# Patient Record
Sex: Male | Born: 1945 | Race: Black or African American | Hispanic: No | Marital: Married | Smoking: Former smoker
Health system: Southern US, Community
[De-identification: ages and names within clinical notes are randomized; demographics above are authoritative.]

## PROBLEM LIST (undated history)

## (undated) DIAGNOSIS — R972 Elevated prostate specific antigen [PSA]: Secondary | ICD-10-CM

## (undated) DIAGNOSIS — R002 Palpitations: Secondary | ICD-10-CM

## (undated) DIAGNOSIS — M2669 Other specified disorders of temporomandibular joint: Secondary | ICD-10-CM

## (undated) DIAGNOSIS — I10 Essential (primary) hypertension: Secondary | ICD-10-CM

## (undated) DIAGNOSIS — I480 Paroxysmal atrial fibrillation: Secondary | ICD-10-CM

## (undated) DIAGNOSIS — N529 Male erectile dysfunction, unspecified: Secondary | ICD-10-CM

## (undated) DIAGNOSIS — N182 Chronic kidney disease, stage 2 (mild): Secondary | ICD-10-CM

## (undated) DIAGNOSIS — Z9841 Cataract extraction status, right eye: Secondary | ICD-10-CM

## (undated) DIAGNOSIS — E039 Hypothyroidism, unspecified: Secondary | ICD-10-CM

## (undated) DIAGNOSIS — I5189 Other ill-defined heart diseases: Secondary | ICD-10-CM

## (undated) DIAGNOSIS — K635 Polyp of colon: Secondary | ICD-10-CM

## (undated) DIAGNOSIS — I77810 Thoracic aortic ectasia: Secondary | ICD-10-CM

## (undated) DIAGNOSIS — M75102 Unspecified rotator cuff tear or rupture of left shoulder, not specified as traumatic: Secondary | ICD-10-CM

## (undated) DIAGNOSIS — I499 Cardiac arrhythmia, unspecified: Secondary | ICD-10-CM

## (undated) DIAGNOSIS — Z87891 Personal history of nicotine dependence: Secondary | ICD-10-CM

## (undated) DIAGNOSIS — J45909 Unspecified asthma, uncomplicated: Secondary | ICD-10-CM

## (undated) DIAGNOSIS — D696 Thrombocytopenia, unspecified: Secondary | ICD-10-CM

## (undated) DIAGNOSIS — N400 Enlarged prostate without lower urinary tract symptoms: Secondary | ICD-10-CM

## (undated) DIAGNOSIS — E785 Hyperlipidemia, unspecified: Secondary | ICD-10-CM

## (undated) DIAGNOSIS — I4729 Other ventricular tachycardia: Secondary | ICD-10-CM

## (undated) DIAGNOSIS — I700 Atherosclerosis of aorta: Secondary | ICD-10-CM

## (undated) DIAGNOSIS — Z9289 Personal history of other medical treatment: Secondary | ICD-10-CM

## (undated) DIAGNOSIS — I471 Supraventricular tachycardia, unspecified: Secondary | ICD-10-CM

## (undated) DIAGNOSIS — E038 Other specified hypothyroidism: Secondary | ICD-10-CM

## (undated) DIAGNOSIS — R0789 Other chest pain: Secondary | ICD-10-CM

## (undated) DIAGNOSIS — Z7901 Long term (current) use of anticoagulants: Secondary | ICD-10-CM

## (undated) HISTORY — DX: Supraventricular tachycardia, unspecified: I47.10

## (undated) HISTORY — PX: HERNIA REPAIR: SHX51

## (undated) HISTORY — PX: CATARACT EXTRACTION: SUR2

## (undated) HISTORY — DX: Paroxysmal atrial fibrillation: I48.0

## (undated) HISTORY — PX: TRIGGER FINGER RELEASE: SHX641

## (undated) HISTORY — PX: EYE SURGERY: SHX253

## (undated) HISTORY — DX: Hyperlipidemia, unspecified: E78.5

## (undated) HISTORY — DX: Other ventricular tachycardia: I47.29

## (undated) HISTORY — DX: Chronic kidney disease, stage 2 (mild): N18.2

## (undated) HISTORY — DX: Palpitations: R00.2

## (undated) HISTORY — DX: Unspecified asthma, uncomplicated: J45.909

## (undated) HISTORY — DX: Other ill-defined heart diseases: I51.89

## (undated) HISTORY — DX: Personal history of other medical treatment: Z92.89

## (undated) HISTORY — PX: KNEE SURGERY: SHX244

## (undated) HISTORY — PX: FINGER SURGERY: SHX640

## (undated) HISTORY — DX: Thoracic aortic ectasia: I77.810

## (undated) HISTORY — DX: Essential (primary) hypertension: I10

## (undated) HISTORY — PX: INGUINAL HERNIA REPAIR: SUR1180

---

## 2004-11-12 ENCOUNTER — Ambulatory Visit: Payer: Self-pay | Admitting: General Practice

## 2005-12-21 HISTORY — PX: INGUINAL HERNIA REPAIR: SUR1180

## 2006-05-19 ENCOUNTER — Ambulatory Visit: Payer: Self-pay

## 2006-10-15 ENCOUNTER — Ambulatory Visit: Payer: Self-pay

## 2006-11-16 ENCOUNTER — Ambulatory Visit: Payer: Self-pay | Admitting: Gastroenterology

## 2006-12-21 HISTORY — PX: INGUINAL HERNIA REPAIR: SUR1180

## 2007-11-07 ENCOUNTER — Ambulatory Visit: Payer: Self-pay | Admitting: Family Medicine

## 2008-10-31 ENCOUNTER — Ambulatory Visit: Payer: Self-pay | Admitting: Family Medicine

## 2009-12-11 ENCOUNTER — Ambulatory Visit: Payer: Self-pay | Admitting: Gastroenterology

## 2010-03-25 ENCOUNTER — Ambulatory Visit: Payer: Self-pay | Admitting: Family Medicine

## 2011-05-14 ENCOUNTER — Ambulatory Visit: Payer: Self-pay | Admitting: Family Medicine

## 2012-10-06 ENCOUNTER — Ambulatory Visit: Payer: Self-pay | Admitting: Unknown Physician Specialty

## 2013-09-01 ENCOUNTER — Ambulatory Visit: Payer: Self-pay | Admitting: Family Medicine

## 2013-10-26 ENCOUNTER — Emergency Department: Payer: Self-pay | Admitting: Emergency Medicine

## 2013-10-26 ENCOUNTER — Ambulatory Visit (INDEPENDENT_AMBULATORY_CARE_PROVIDER_SITE_OTHER): Payer: 59 | Admitting: Cardiovascular Disease

## 2013-10-26 ENCOUNTER — Encounter: Payer: Self-pay | Admitting: Cardiovascular Disease

## 2013-10-26 VITALS — BP 100/68 | HR 139 | Ht 71.0 in | Wt 185.2 lb

## 2013-10-26 DIAGNOSIS — I4891 Unspecified atrial fibrillation: Secondary | ICD-10-CM | POA: Diagnosis not present

## 2013-10-26 DIAGNOSIS — I480 Paroxysmal atrial fibrillation: Secondary | ICD-10-CM | POA: Insufficient documentation

## 2013-10-26 LAB — APTT: Activated PTT: 35.6 secs (ref 23.6–35.9)

## 2013-10-26 LAB — COMPREHENSIVE METABOLIC PANEL
Albumin: 3.2 g/dL — ABNORMAL LOW (ref 3.4–5.0)
Anion Gap: 1 — ABNORMAL LOW (ref 7–16)
BUN: 15 mg/dL (ref 7–18)
Bilirubin,Total: 0.4 mg/dL (ref 0.2–1.0)
Co2: 28 mmol/L (ref 21–32)
EGFR (African American): >60
Glucose: 100 mg/dL — ABNORMAL HIGH (ref 65–99)
Osmolality: 280 (ref 275–301)
Potassium: 4.1 mmol/L (ref 3.5–5.1)
SGOT(AST): 32 U/L (ref 15–37)
SGPT (ALT): 23 U/L (ref 12–78)

## 2013-10-26 LAB — CBC WITH DIFFERENTIAL/PLATELET
Basophil #: 0.1 10*3/uL (ref 0.0–0.1)
Eosinophil #: 0.2 10*3/uL (ref 0.0–0.7)
Eosinophil %: 4.2 %
HCT: 44.2 % (ref 40.0–52.0)
MCH: 31 pg (ref 26.0–34.0)
MCHC: 35.6 g/dL (ref 32.0–36.0)
RBC: 5.07 10*6/uL (ref 4.40–5.90)
WBC: 5.8 10*3/uL (ref 3.8–10.6)

## 2013-10-26 LAB — PROTIME-INR: Prothrombin Time: 14 secs (ref 11.5–14.7)

## 2013-10-26 LAB — CK TOTAL AND CKMB (NOT AT ARMC)
CK, Total: 295 U/L — ABNORMAL HIGH (ref 35–232)
CK-MB: 1.5 ng/mL (ref 0.5–3.6)

## 2013-10-26 LAB — URINALYSIS, COMPLETE
Blood: NEGATIVE
Ketone: NEGATIVE
Ph: 5 (ref 4.5–8.0)
Squamous Epithelial: NONE SEEN

## 2013-10-26 LAB — TROPONIN I: Troponin-I: <0.02 ng/mL

## 2013-10-26 LAB — MAGNESIUM: Magnesium: 1.8 mg/dL

## 2013-10-26 MED ORDER — AMIODARONE HCL 200 MG PO TABS: 200.0000 mg | ORAL_TABLET | Freq: Two times a day (BID) | ORAL | Status: DC

## 2013-10-26 MED ORDER — APIXABAN 5 MG PO TABS: 5.0000 mg | ORAL_TABLET | Freq: Two times a day (BID) | ORAL | Status: DC

## 2013-10-26 MED ORDER — METOPROLOL TARTRATE 25 MG PO TABS: 25.0000 mg | ORAL_TABLET | Freq: Two times a day (BID) | ORAL | Status: DC

## 2013-10-26 NOTE — Assessment & Plan Note (Addendum)
Hospital records were reviewed including lab work, emergency room records and EKGs . Notes from Dr. Juanetta Gosling and his EKG reviewed .Rate improved on clinical exam. We will start him on Eliquis  5 mg twice a day, will start metoprolol 25 mg twice a day and amiodarone 400 mg twice a day for 5 days with titration down to 200 mg twice a day after that. We'll schedule him for followup in 2 weeks' time. We have suggested that he closely monitor his heart rate. If he feels he has converted back to normal sinus rhythm, we have suggested he call our office. We will hold off on echocardiogram until rate is slower or he has converted back to normal sinus rhythm. Cardioversion may be needed if he does not convert to normal sinus rhythm in the next 2 weeks.

## 2013-10-26 NOTE — Patient Instructions (Signed)
You are in atrial fibrillation Please start metoprolol 25 mg twice a day  Please start amiodarone 400 mg twice a day for 5 days, then 200 mg twice a day  Start eliquis 5 mg twice a day (blood thinner)  Come in for follow up visit in 10 to 14 days, ekg at that time  Call the office if you think you are back in normal rhythm  Please call us if you have new issues that need to be addressed before your next appt.

## 2013-10-26 NOTE — Progress Notes (Signed)
   Patient ID: Andrew Benson, male    DOB: 10-02-1946, 67 y.o.   MRN: [id number]  HPI Comments: Mr. Brobeck is a pleasant 67 year old gentleman with remote history of atrial fibrillation 25 years ago, remote history of smoking for 20 years, patient of Dr. Juanetta Gosling who presents from the emergency room for evaluation of atrial fibrillation.  He reports that he was on his tractor yesterday when he developed the acute onset of palpitations. He went into the house, and did no further workup for the rest of the day. He went to sleep and woke up this morning again with palpitations. He went to see Dr. Juanetta Gosling and was referred to the emergency room.  In the emergency room, he was noted to be in atrial fibrillation with ventricular rate 93 beats per minute EKG earlier in the day showed atrial fibrillation with ventricular rate 99 beats per minute  Lab work in the hospital showed hematocrit 44, creatinine 1.3, magnesium 1.8, potassium 4.1, normal cardiac enzymes, TSH 0.42.  EKG in office today shows atrial fibrillation with ventricular rate 139 beats per minute, nonspecific ST abnormality    Outpatient Encounter Prescriptions as of 10/26/2013  Medication Sig  . aspirin 81 MG tablet Take 81 mg by mouth daily.  Marland Kitchen b complex vitamins capsule Take 1 capsule by mouth daily.  . Omega-3 Fatty Acids (FISH OIL) 1000 MG CAPS Take 1,000 mg by mouth 2 (two) times daily.  . vitamin E 400 UNIT capsule Take 400 Units by mouth daily.     Review of Systems  Constitutional: Negative.   HENT: Negative.   Eyes: Negative.   Respiratory: Negative.   Cardiovascular: Positive for palpitations.  Gastrointestinal: Negative.   Endocrine: Negative.   Musculoskeletal: Negative.   Skin: Negative.   Allergic/Immunologic: Negative.   Neurological: Negative.   Hematological: Negative.   Psychiatric/Behavioral: Negative.   All other systems reviewed and are negative.   BP 100/68  Pulse 139  Ht 5\' 11"  (1.803 m)  Wt 185 lb 4  oz (84.029 kg)  BMI 25.85 kg/m2   Physical Exam  Nursing note and vitals reviewed. Constitutional: He is oriented to person, place, and time. He appears well-developed and well-nourished.  HENT:  Head: Normocephalic.  Nose: Nose normal.  Mouth/Throat: Oropharynx is clear and moist.  Eyes: Conjunctivae are normal. Pupils are equal, round, and reactive to light.  Neck: Normal range of motion. Neck supple. No JVD present.  Cardiovascular: Normal rate, regular rhythm, S1 normal, S2 normal, normal heart sounds and intact distal pulses.  Exam reveals no gallop and no friction rub.   No murmur heard. Pulmonary/Chest: Effort normal and breath sounds normal. No respiratory distress. He has no wheezes. He has no rales. He exhibits no tenderness.  Abdominal: Soft. Bowel sounds are normal. He exhibits no distension. There is no tenderness.  Musculoskeletal: Normal range of motion. He exhibits no edema and no tenderness.  Lymphadenopathy:    He has no cervical adenopathy.  Neurological: He is alert and oriented to person, place, and time. Coordination normal.  Skin: Skin is warm and dry. No rash noted. No erythema.  Psychiatric: He has a normal mood and affect. His behavior is normal. Judgment and thought content normal.      Assessment and Plan

## 2013-10-30 ENCOUNTER — Telehealth: Payer: Self-pay | Admitting: *Deleted

## 2013-10-30 NOTE — Telephone Encounter (Signed)
Spoke w/ pt.  He states that he is no longer in afib and would like to stop his medications.  Pt has not been evaluated or had an ekg to confirm this.  Pt sched to see Dr. Mariah Milling tomorrow for follow up.

## 2013-10-30 NOTE — Telephone Encounter (Signed)
He called and is feeling better and out of afib. Please call he has some medication questions.

## 2013-10-31 ENCOUNTER — Encounter: Payer: Self-pay | Admitting: Cardiovascular Disease

## 2013-10-31 ENCOUNTER — Ambulatory Visit (INDEPENDENT_AMBULATORY_CARE_PROVIDER_SITE_OTHER): Payer: 59 | Admitting: Cardiovascular Disease

## 2013-10-31 VITALS — BP 110/80 | HR 54 | Ht 71.0 in | Wt 186.5 lb

## 2013-10-31 DIAGNOSIS — I4891 Unspecified atrial fibrillation: Secondary | ICD-10-CM

## 2013-10-31 NOTE — Patient Instructions (Signed)
You are doing well.  Please start asa 81 mg daily OK to hold eliquis  If you convert to atrial fibrillation,  With high rate take eliquis twice a  (thinner) Take amiodarone 400 mg twice a day, with metoprolol twice a day Until you convert back to normal rhythm  Please call us if you have new issues that need to be addressed before your next appt.  Your physician wants you to follow-up in: 12 months.  You will receive a reminder letter in the mail two months in advance. If you don't receive a letter, please call our office to schedule the follow-up appointment.

## 2013-10-31 NOTE — Assessment & Plan Note (Signed)
He has converted back to normal sinus rhythm. We have suggested he hold his anticoagulation. He is bradycardic today but asymptomatic. He is no longer on amiodarone or metoprolol. He we'll closely monitor his heart rate to track for recurrent episodes of atrial fibrillation. He can monitor his heart rate with blood pressure cuff, pulse oximetry at home. If he converts back to atrial fibrillation, he will restart anticoagulation, amiodarone, metoprolol and call our office.

## 2013-10-31 NOTE — Progress Notes (Signed)
Patient ID: Andrew Benson, male    DOB: 1946-01-21, 67 y.o.   MRN: [id number]  HPI Comments: Andrew Benson is a pleasant 67 year old gentleman with remote history of atrial fibrillation 25 years ago, remote history of smoking for 20 years, patient of Dr. Juanetta Gosling who presents for routine followup for atrial fibrillation.   He reports that he was on his tractor last week when he developed the acute onset of palpitations. He went into the house, and did no further workup for the rest of the day. He went to sleep and woke up this morning again with palpitations. He went to see Dr. Juanetta Gosling and was referred to the emergency room. In the emergency room, he was noted to be in atrial fibrillation with ventricular rate 93 beats per minute EKG earlier in the day showed atrial fibrillation with ventricular rate 99 beats per minute  He was seen in our office last week and started on eliquis 5 mg twice a day, metoprolol 25 mg twice a day, amiodarone 400 mg load twice a day. After 2 days, he felt that he is back in normal sinus rhythm, heart rate in the 50s. He stop the amiodarone and metoprolol, continue the eliquis. Today he feels well with no complaints. Last dosing of amiodarone and metoprolol was November 8.  Lab work in the hospital showed hematocrit 44, creatinine 1.3, magnesium 1.8, potassium 4.1, normal cardiac enzymes, TSH 0.42.  EKG in office last week showed atrial fibrillation with ventricular rate 139 beats per minute, nonspecific ST abnormality EKG today shows normal sinus rhythm with rate 54 beats per minute, no significant ST or T wave changes    Outpatient Encounter Prescriptions as of 10/26/2013  Medication Sig  . aspirin 81 MG tablet Take 81 mg by mouth daily.  Marland Kitchen b complex vitamins capsule Take 1 capsule by mouth daily.  . Omega-3 Fatty Acids (FISH OIL) 1000 MG CAPS Take 1,000 mg by mouth 2 (two) times daily.  . vitamin E 400 UNIT capsule Take 400 Units by mouth daily.     Review of  Systems  Constitutional: Negative.   HENT: Negative.   Eyes: Negative.   Respiratory: Negative.   Cardiovascular: Positive for palpitations.  Gastrointestinal: Negative.   Endocrine: Negative.   Musculoskeletal: Negative.   Skin: Negative.   Allergic/Immunologic: Negative.   Neurological: Negative.   Hematological: Negative.   Psychiatric/Behavioral: Negative.   All other systems reviewed and are negative.   BP 110/80  Pulse 54  Ht 5\' 11"  (1.803 m)  Wt 186 lb 8 oz (84.596 kg)  BMI 26.02 kg/m2   Physical Exam  Nursing note and vitals reviewed. Constitutional: He is oriented to person, place, and time. He appears well-developed and well-nourished.  HENT:  Head: Normocephalic.  Nose: Nose normal.  Mouth/Throat: Oropharynx is clear and moist.  Eyes: Conjunctivae are normal. Pupils are equal, round, and reactive to light.  Neck: Normal range of motion. Neck supple. No JVD present.  Cardiovascular: Normal rate, regular rhythm, S1 normal, S2 normal, normal heart sounds and intact distal pulses.  Exam reveals no gallop and no friction rub.   No murmur heard. Pulmonary/Chest: Effort normal and breath sounds normal. No respiratory distress. He has no wheezes. He has no rales. He exhibits no tenderness.  Abdominal: Soft. Bowel sounds are normal. He exhibits no distension. There is no tenderness.  Musculoskeletal: Normal range of motion. He exhibits no edema and no tenderness.  Lymphadenopathy:    He has no cervical adenopathy.  Neurological: He is alert and oriented to person, place, and time. Coordination normal.  Skin: Skin is warm and dry. No rash noted. No erythema.  Psychiatric: He has a normal mood and affect. His behavior is normal. Judgment and thought content normal.      Assessment and Plan

## 2013-11-20 ENCOUNTER — Encounter: Payer: Self-pay | Admitting: *Deleted

## 2015-01-01 ENCOUNTER — Encounter: Payer: Self-pay | Admitting: Cardiovascular Disease

## 2015-01-01 ENCOUNTER — Ambulatory Visit (INDEPENDENT_AMBULATORY_CARE_PROVIDER_SITE_OTHER): Payer: Medicare Other | Admitting: Cardiovascular Disease

## 2015-01-01 VITALS — BP 120/80 | HR 58 | Ht 71.0 in | Wt 186.5 lb

## 2015-01-01 DIAGNOSIS — I4891 Unspecified atrial fibrillation: Secondary | ICD-10-CM | POA: Diagnosis not present

## 2015-01-01 NOTE — Progress Notes (Signed)
Patient ID: Andrew Benson, male    DOB: 01/12/1946, 69 y.o.   MRN: [id number]  HPI Comments: Andrew Benson is a pleasant 69 year old gentleman with remote history of atrial fibrillation 25 years ago, remote history of smoking for 20 years, patient of Andrew Benson who presents for routine followup for atrial fibrillation.  Previously had atrial fibrillation while he was on his tractor in 2015, developed  acute onset of palpitations, presented to the emergency room   In follow-up today, he denies any further episodes of arrhythmia. He's feeling well, active, no complaints. He periodically monitors his heart rhythm on his phone. He reports having 2 previous episodes of atrial fibrillation, he has felt both episodes as he did not feel right, had palpitations  He does have eliquis 5 mg twice a day, metoprolol 25 mg twice a day, amiodarone 400 mg pills at home if needed He previously took these medications for 2 days and converted back to normal sinus rhythm then he stop the medications   Previous Lab work in the hospital showed hematocrit 44, creatinine 1.3, magnesium 1.8, potassium 4.1, normal cardiac enzymes, TSH 0.42.  EKG in office last week showed atrial fibrillation with ventricular rate 139 beats per minute, nonspecific ST abnormality EKG today shows normal sinus rhythm with no significant ST or T wave changes    Allergies  Allergen Reactions  . Penicillins     Rash     Outpatient Encounter Prescriptions as of 01/01/2015  Medication Sig  . [DISCONTINUED] apixaban (ELIQUIS) 5 MG TABS tablet Take 1 tablet (5 mg total) by mouth 2 (two) times daily. (Patient not taking: Reported on 01/01/2015)    Past Medical History  Diagnosis Date  . Asthma   . Hyperlipidemia   . Arrhythmia   . A-fib     Past Surgical History  Procedure Laterality Date  . Knee surgery      left   . Hernia repair    . Finger surgery      left index finger    Social History  reports that he quit smoking  about 21 years ago. His smoking use included Cigarettes. He has a 20 pack-year smoking history. He does not have any smokeless tobacco history on file. He reports that he drinks alcohol. He reports that he does not use illicit drugs.  Family History family history includes Heart disease in his mother and sister; Hyperlipidemia in his mother; Hypertension in his brother and mother.   Review of Systems  Constitutional: Negative.   Eyes: Negative.   Respiratory: Negative.   Cardiovascular: Negative.   Gastrointestinal: Negative.   Musculoskeletal: Negative.   Neurological: Negative.   Hematological: Negative.   Psychiatric/Behavioral: Negative.   All other systems reviewed and are negative.  BP 120/80 mmHg  Pulse 58  Ht 5\' 11"  (1.803 m)  Wt 186 lb 8 oz (84.596 kg)  BMI 26.02 kg/m2   Physical Exam  Constitutional: He is oriented to person, place, and time. He appears well-developed and well-nourished.  HENT:  Head: Normocephalic.  Nose: Nose normal.  Mouth/Throat: Oropharynx is clear and moist.  Eyes: Conjunctivae are normal. Pupils are equal, round, and reactive to light.  Neck: Normal range of motion. Neck supple. No JVD present.  Cardiovascular: Normal rate, regular rhythm, S1 normal, S2 normal, normal heart sounds and intact distal pulses.  Exam reveals no gallop and no friction rub.   No murmur heard. Pulmonary/Chest: Effort normal and breath sounds normal. No respiratory distress. He has no wheezes.  He has no rales. He exhibits no tenderness.  Abdominal: Soft. Bowel sounds are normal. He exhibits no distension. There is no tenderness.  Musculoskeletal: Normal range of motion. He exhibits no edema or tenderness.  Lymphadenopathy:    He has no cervical adenopathy.  Neurological: He is alert and oriented to person, place, and time. Coordination normal.  Skin: Skin is warm and dry. No rash noted. No erythema.  Psychiatric: He has a normal mood and affect. His behavior is  normal. Judgment and thought content normal.      Assessment and Plan   Nursing note and vitals reviewed.

## 2015-01-01 NOTE — Patient Instructions (Signed)
You are doing well  If you convert to atrial fibrillation, Take one eliquis twice a day (Am and Pm) Take metoprolol and amiodarone together 3 to 4 hours later, take another metoprolol and amiodarone  Please call us if you have new issues that need to be addressed before your next appt.  Your physician wants you to follow-up in: 12 months.  You will receive a reminder letter in the mail two months in advance. If you don't receive a letter, please call our office to schedule the follow-up appointment.

## 2015-01-01 NOTE — Assessment & Plan Note (Addendum)
No recent episodes of atrial fibrillation. He does have anticoagulation, metoprolol and amiodarone at home if needed. He reports that he will have routine lab work with Dr. Luan Pulling

## 2015-01-03 ENCOUNTER — Ambulatory Visit: Payer: 59 | Admitting: Cardiovascular Disease

## 2015-01-10 DIAGNOSIS — K635 Polyp of colon: Secondary | ICD-10-CM | POA: Diagnosis not present

## 2015-01-10 DIAGNOSIS — Z1389 Encounter for screening for other disorder: Secondary | ICD-10-CM | POA: Diagnosis not present

## 2015-01-10 DIAGNOSIS — J4520 Mild intermittent asthma, uncomplicated: Secondary | ICD-10-CM | POA: Diagnosis not present

## 2015-01-10 DIAGNOSIS — I498 Other specified cardiac arrhythmias: Secondary | ICD-10-CM | POA: Diagnosis not present

## 2015-01-10 DIAGNOSIS — Z9181 History of falling: Secondary | ICD-10-CM | POA: Diagnosis not present

## 2015-01-10 DIAGNOSIS — E785 Hyperlipidemia, unspecified: Secondary | ICD-10-CM | POA: Diagnosis not present

## 2015-01-10 DIAGNOSIS — N433 Hydrocele, unspecified: Secondary | ICD-10-CM | POA: Diagnosis not present

## 2015-01-10 DIAGNOSIS — Z0000 Encounter for general adult medical examination without abnormal findings: Secondary | ICD-10-CM | POA: Diagnosis not present

## 2015-01-10 DIAGNOSIS — N289 Disorder of kidney and ureter, unspecified: Secondary | ICD-10-CM | POA: Diagnosis not present

## 2015-01-30 ENCOUNTER — Telehealth: Payer: Self-pay | Admitting: *Deleted

## 2015-01-30 NOTE — Telephone Encounter (Signed)
Pt dropped of lab work he did, with cholesterol levels Please call patient when and if you have any questions.  Took back to [facility]

## 2015-01-30 NOTE — Telephone Encounter (Signed)
Placed in Dr. Donivan Scull basket for review.

## 2015-02-06 ENCOUNTER — Telehealth: Payer: Self-pay | Admitting: *Deleted

## 2015-02-06 NOTE — Telephone Encounter (Signed)
I don't see any recent labs on him.  Do you want me to have in come in for chol check?

## 2015-02-06 NOTE — Telephone Encounter (Signed)
Patient called and wants to know if Dr. Rockey Situ wants him to start on a cholestrol medicine?

## 2015-02-07 MED ORDER — ATORVASTATIN CALCIUM 20 MG PO TABS: 20.0000 mg | ORAL_TABLET | Freq: Every day | ORAL | Status: DC

## 2015-02-07 NOTE — Telephone Encounter (Signed)
Spoke w/ pt.  Advised him of Dr. Donivan Scull recommendation:  "Needs chol med.  Lipitor 20 mg daily.  Repeat labs 3 mos"   He verbalizes understanding and is agreeable to this.

## 2015-02-13 DIAGNOSIS — H59092 Other disorders of the left eye following cataract surgery: Secondary | ICD-10-CM | POA: Diagnosis not present

## 2015-02-15 DIAGNOSIS — E039 Hypothyroidism, unspecified: Secondary | ICD-10-CM | POA: Diagnosis not present

## 2015-02-22 DIAGNOSIS — E039 Hypothyroidism, unspecified: Secondary | ICD-10-CM | POA: Diagnosis not present

## 2015-02-25 ENCOUNTER — Ambulatory Visit: Payer: Self-pay

## 2015-02-25 DIAGNOSIS — E0590 Thyrotoxicosis, unspecified without thyrotoxic crisis or storm: Secondary | ICD-10-CM | POA: Diagnosis not present

## 2015-03-06 ENCOUNTER — Ambulatory Visit: Payer: Self-pay

## 2015-03-06 DIAGNOSIS — E0590 Thyrotoxicosis, unspecified without thyrotoxic crisis or storm: Secondary | ICD-10-CM | POA: Diagnosis not present

## 2015-03-07 DIAGNOSIS — E0590 Thyrotoxicosis, unspecified without thyrotoxic crisis or storm: Secondary | ICD-10-CM | POA: Diagnosis not present

## 2015-03-07 DIAGNOSIS — E039 Hypothyroidism, unspecified: Secondary | ICD-10-CM | POA: Diagnosis not present

## 2015-03-12 DIAGNOSIS — N433 Hydrocele, unspecified: Secondary | ICD-10-CM | POA: Insufficient documentation

## 2015-03-12 DIAGNOSIS — N529 Male erectile dysfunction, unspecified: Secondary | ICD-10-CM | POA: Insufficient documentation

## 2015-03-12 DIAGNOSIS — N4340 Spermatocele of epididymis, unspecified: Secondary | ICD-10-CM | POA: Insufficient documentation

## 2015-03-12 DIAGNOSIS — N401 Enlarged prostate with lower urinary tract symptoms: Secondary | ICD-10-CM

## 2015-03-12 DIAGNOSIS — N138 Other obstructive and reflux uropathy: Secondary | ICD-10-CM | POA: Insufficient documentation

## 2015-04-16 DIAGNOSIS — H18411 Arcus senilis, right eye: Secondary | ICD-10-CM | POA: Diagnosis not present

## 2015-04-16 DIAGNOSIS — Z9842 Cataract extraction status, left eye: Secondary | ICD-10-CM | POA: Diagnosis not present

## 2015-04-16 DIAGNOSIS — H26492 Other secondary cataract, left eye: Secondary | ICD-10-CM | POA: Diagnosis not present

## 2015-04-16 DIAGNOSIS — Z961 Presence of intraocular lens: Secondary | ICD-10-CM | POA: Diagnosis not present

## 2015-06-03 ENCOUNTER — Ambulatory Visit (INDEPENDENT_AMBULATORY_CARE_PROVIDER_SITE_OTHER): Payer: Medicare Other | Admitting: Family Medicine

## 2015-06-03 ENCOUNTER — Encounter: Payer: Self-pay | Admitting: Family Medicine

## 2015-06-03 VITALS — BP 120/83 | HR 65 | Temp 97.9°F | Resp 16 | Wt 182.0 lb

## 2015-06-03 DIAGNOSIS — J069 Acute upper respiratory infection, unspecified: Secondary | ICD-10-CM | POA: Diagnosis not present

## 2015-06-03 DIAGNOSIS — E785 Hyperlipidemia, unspecified: Secondary | ICD-10-CM | POA: Diagnosis not present

## 2015-06-03 DIAGNOSIS — J40 Bronchitis, not specified as acute or chronic: Secondary | ICD-10-CM

## 2015-06-03 MED ORDER — ALBUTEROL SULFATE HFA 108 (90 BASE) MCG/ACT IN AERS: 2.0000 | INHALATION_SPRAY | Freq: Four times a day (QID) | RESPIRATORY_TRACT | Status: DC | PRN

## 2015-06-03 MED ORDER — DM-GUAIFENESIN ER 30-600 MG PO TB12: 1.0000 | ORAL_TABLET | Freq: Two times a day (BID) | ORAL | Status: DC

## 2015-06-03 MED ORDER — AZITHROMYCIN 250 MG PO TABS: ORAL_TABLET | ORAL | Status: DC

## 2015-06-03 NOTE — Progress Notes (Signed)
Name: Andrew Benson   MRN: [id number]    DOB: 03-18-1946   Date:06/03/2015       Progress Note  Subjective  Chief Complaint  Chief Complaint  Patient presents with  . Nasal Congestion    x 3 days  . Coughing    HPI  Sore throat x 3 days.  Developed nasal and chest congestion since.  No fever.  + chills.  Cough productive of yellow/green sputum.  Some SOB.  Some wheezes.  Past Medical History  Diagnosis Date  . Asthma   . Hyperlipidemia   . Arrhythmia   . A-fib     History  Substance Use Topics  . Smoking status: Former Smoker -- 1.00 packs/day for 20 years    Types: Cigarettes    Quit date: 02/01/1993  . Smokeless tobacco: Not on file  . Alcohol Use: Yes     Comment: social drinker.     Current outpatient prescriptions:  .  atorvastatin (LIPITOR) 20 MG tablet, Take 1 tablet (20 mg total) by mouth daily., Disp: 90 tablet, Rfl: 3  Allergies  Allergen Reactions  . Penicillins     Rash     Review of Systems  Constitutional: Positive for chills and malaise/fatigue. Negative for fever.  HENT: Positive for congestion and sore throat.   Eyes: Negative.  Negative for blurred vision and double vision.  Respiratory: Positive for cough, sputum production, shortness of breath and wheezing.   Cardiovascular: Negative.  Negative for chest pain, palpitations, orthopnea and leg swelling.  Gastrointestinal: Negative.  Negative for nausea, vomiting, abdominal pain and diarrhea.  Skin: Negative for rash.  Neurological: Positive for weakness. Negative for headaches.      Objective  Filed Vitals:   06/03/15 1108  BP: 120/83  Pulse: 65  Temp: 97.9 F (36.6 C)  Resp: 16  Weight: 182 lb (82.555 kg)     Physical Exam  Constitutional: He appears distressed (mild).  HENT:  Right Ear: External ear and ear canal normal.  Left Ear: External ear and ear canal normal.  Nose: Mucosal edema and rhinorrhea present. Right sinus exhibits no maxillary sinus tenderness and no  frontal sinus tenderness. Left sinus exhibits no maxillary sinus tenderness and no frontal sinus tenderness.  Mouth/Throat: Uvula is midline. Posterior oropharyngeal erythema (mild, diffuse) present. No oropharyngeal exudate or tonsillar abscesses.  Cardiovascular: Normal rate, regular rhythm, normal heart sounds and intact distal pulses.  Exam reveals no gallop and no friction rub.   No murmur heard. Pulmonary/Chest: Effort normal. He has wheezes (faint diffuse). He has rhonchi (coarse breath sounds throughout.).  Vitals reviewed.       Assessment & Plan     1. Upper respiratory infection   2. Bronchitis  - azithromycin (ZITHROMAX) 250 MG tablet; Take 2 tabs on day one, then 1 tab daily on days 2-5.  Dispense: 6 tablet; Refill: 0 - albuterol (PROVENTIL HFA;VENTOLIN HFA) 108 (90 BASE) MCG/ACT inhaler; Inhale 2 puffs into the lungs every 6 (six) hours as needed for wheezing or shortness of breath.  Dispense: 1 Inhaler; Refill: 6 - dextromethorphan-guaiFENesin (MUCINEX DM) 30-600 MG per 12 hr tablet; Take 1 tablet by mouth 2 (two) times daily.  Dispense: 20 tablet; Refill: 0

## 2015-06-28 ENCOUNTER — Telehealth: Payer: Self-pay | Admitting: *Deleted

## 2015-06-28 NOTE — Telephone Encounter (Signed)
Pt c/o medication issue:  1. Name of Medication: Lipitor   2. How are you currently taking this medication (dosage and times per day)? 1 tablet a day   3. Are you having a reaction (difficulty breathing--STAT)? No   4. What is your medication issue? Pt states he is having headaches, muscles pains, can't be in the sun long.  Pt would like to get on Crestor.  Please advise.

## 2015-06-28 NOTE — Telephone Encounter (Signed)
Pt would like to switch from Lipitor to Crestor.

## 2015-06-28 NOTE — Telephone Encounter (Signed)
Routing to Dr. Rockey Situ and [employer] Triage.

## 2015-06-30 NOTE — Telephone Encounter (Signed)
Ok to change to crestor 10 mg daily It is not generic price yet, not for a few months

## 2015-07-01 MED ORDER — ROSUVASTATIN CALCIUM 10 MG PO TABS: 10.0000 mg | ORAL_TABLET | Freq: Every day | ORAL | Status: DC

## 2015-07-01 NOTE — Telephone Encounter (Signed)
Spoke w/ pt.  Advised him of Dr. Gollan's recommendation.  He verbalizes understanding and will call back w/ any further questions or concerns.  

## 2015-07-12 ENCOUNTER — Encounter: Payer: Self-pay | Admitting: Family Medicine

## 2015-07-12 ENCOUNTER — Ambulatory Visit (INDEPENDENT_AMBULATORY_CARE_PROVIDER_SITE_OTHER): Payer: Medicare Other | Admitting: Family Medicine

## 2015-07-12 VITALS — BP 113/76 | HR 65 | Resp 16 | Ht 71.0 in | Wt 182.0 lb

## 2015-07-12 DIAGNOSIS — N289 Disorder of kidney and ureter, unspecified: Secondary | ICD-10-CM

## 2015-07-12 DIAGNOSIS — R7989 Other specified abnormal findings of blood chemistry: Secondary | ICD-10-CM

## 2015-07-12 DIAGNOSIS — H9311 Tinnitus, right ear: Secondary | ICD-10-CM | POA: Diagnosis not present

## 2015-07-12 DIAGNOSIS — H9319 Tinnitus, unspecified ear: Secondary | ICD-10-CM | POA: Insufficient documentation

## 2015-07-12 DIAGNOSIS — E038 Other specified hypothyroidism: Secondary | ICD-10-CM | POA: Insufficient documentation

## 2015-07-12 DIAGNOSIS — E785 Hyperlipidemia, unspecified: Secondary | ICD-10-CM

## 2015-07-12 NOTE — Progress Notes (Signed)
Name: Andrew Benson   MRN: [id number]    DOB: 04-14-1946   Date:07/12/2015       Progress Note  Subjective  Chief Complaint  Chief Complaint  Patient presents with  . Chronic Kidney Disease    stage 1  . Hyperlipidemia    HPI  Fort f/u of hyperlipidemia, abnormal TSH, .  Has hx. Of intermittent A. Fib, but none in >2 yrs.  Feeliog well. Recently changed from Lipitor to Crestor by Card. OBTW-has some ringing in R. Ear.  Past Medical History  Diagnosis Date  . Asthma   . Hyperlipidemia   . Arrhythmia   . A-fib     Past Surgical History  Procedure Laterality Date  . Knee surgery      left   . Hernia repair    . Finger surgery      left index finger    Family History  Problem Relation Age of Onset  . Hyperlipidemia Mother   . Hypertension Mother   . Heart disease Mother   . Heart disease Sister     valve repair   . Hypertension Brother     History   Social History  . Marital Status: Married    Spouse Name: N/A  . Number of Children: N/A  . Years of Education: N/A   Occupational History  . Not on file.   Social History Main Topics  . Smoking status: Former Smoker -- 1.00 packs/day for 20 years    Types: Cigarettes    Quit date: 02/01/1993  . Smokeless tobacco: Never Used  . Alcohol Use: 0.0 oz/week    0 Standard drinks or equivalent per week     Comment: social drinker.  . Drug Use: No  . Sexual Activity: Not on file   Other Topics Concern  . Not on file   Social History Narrative     Current outpatient prescriptions:  .  aspirin EC 81 MG tablet, Take 81 mg by mouth., Disp: , Rfl:  .  rosuvastatin (CRESTOR) 10 MG tablet, Take 1 tablet (10 mg total) by mouth daily., Disp: 30 tablet, Rfl: 6  Allergies  Allergen Reactions  . Penicillins     Rash Rash      Review of Systems  Constitutional: Negative.  Negative for fever, chills and malaise/fatigue.  HENT: Negative for congestion and nosebleeds.   Eyes: Negative for blurred vision and  double vision.  Respiratory: Positive for cough. Negative for sputum production, shortness of breath and wheezing.   Cardiovascular: Negative for chest pain, palpitations, orthopnea and leg swelling.  Gastrointestinal: Negative for heartburn, abdominal pain and blood in stool.  Genitourinary: Negative for dysuria, urgency and frequency.  Musculoskeletal: Negative for myalgias and joint pain.  Skin: Negative for rash.  Neurological: Negative for dizziness, tremors, sensory change, focal weakness, weakness and headaches.      Objective  Filed Vitals:   07/12/15 0857  BP: 113/76  Pulse: 65  Resp: 16  Height: 5\' 11"  (1.803 m)  Weight: 182 lb (82.555 kg)    Physical Exam  Constitutional: He is well-developed, well-nourished, and in no distress.  HENT:  Head: Normocephalic and atraumatic.  Right Ear: Hearing, tympanic membrane, external ear and ear canal normal.  Left Ear: Hearing, tympanic membrane, external ear and ear canal normal.  Nose: Nose normal.  Mouth/Throat: Oropharynx is clear and moist.  Eyes: Conjunctivae and EOM are normal. Pupils are equal, round, and reactive to light. No scleral icterus.  Neck: Normal range of  motion. Neck supple. No thyromegaly present.  Cardiovascular: Normal rate, regular rhythm, normal heart sounds and intact distal pulses.  Exam reveals no gallop and no friction rub.   No murmur heard. Pulmonary/Chest: Effort normal and breath sounds normal. No respiratory distress. He has no wheezes. He has no rales.  Abdominal: Soft. Bowel sounds are normal. He exhibits no distension and no mass. There is no tenderness.  Musculoskeletal: He exhibits no edema.  Lymphadenopathy:    Cervical adenopathy: 1 cm non-tender R. post node.  Long term.  Patient reports getting smaller.  Vitals reviewed.         Assessment & Plan  Problem List Items Addressed This Visit      Other   Hyperlipidemia   Relevant Medications   aspirin EC 81 MG tablet    Abnormal TSH - Primary      Meds ordered this encounter  Medications  . DISCONTD: atorvastatin (LIPITOR) 20 MG tablet    Sig: Take 20 mg by mouth daily.    Refill:  3  . aspirin EC 81 MG tablet    Sig: Take 81 mg by mouth.  . DISCONTD: VENTOLIN HFA 108 (90 BASE) MCG/ACT inhaler    Sig: INHALE 2 PUFFS INTO THE LUNGS EVERY 6 (SIX) HOURS AS NEEDED FOR WHEEZING OR SHORTNESS OF BREATH.    Refill:  6   1. Abnormal TSH  - TSH - T4 - T3  2. Hyperlipidemia   3. Tinnitus, right  - Ambulatory referral to ENT  4. Renal insufficiency  - Comprehensive Metabolic Panel (CMET)

## 2015-07-13 LAB — COMPREHENSIVE METABOLIC PANEL
ALBUMIN: 4 g/dL (ref 3.6–4.8)
ALT: 29 IU/L (ref 0–44)
AST: 29 IU/L (ref 0–40)
Albumin/Globulin Ratio: 1.6 (ref 1.1–2.5)
Alkaline Phosphatase: 87 IU/L (ref 39–117)
BILIRUBIN TOTAL: 0.7 mg/dL (ref 0.0–1.2)
BUN / CREAT RATIO: 14 (ref 10–22)
BUN: 18 mg/dL (ref 8–27)
CHLORIDE: 104 mmol/L (ref 97–108)
CO2: 25 mmol/L (ref 18–29)
CREATININE: 1.29 mg/dL — AB (ref 0.76–1.27)
Calcium: 9.1 mg/dL (ref 8.6–10.2)
GFR calc Af Amer: 65 mL/min/{1.73_m2} (ref >59)
GFR calc non Af Amer: 57 mL/min/{1.73_m2} — ABNORMAL LOW (ref >59)
GLOBULIN, TOTAL: 2.5 g/dL (ref 1.5–4.5)
GLUCOSE: 94 mg/dL (ref 65–99)
Potassium: 4.2 mmol/L (ref 3.5–5.2)
Sodium: 142 mmol/L (ref 134–144)
Total Protein: 6.5 g/dL (ref 6.0–8.5)

## 2015-07-13 LAB — TSH: TSH: 0.495 u[IU]/mL (ref 0.450–4.500)

## 2015-07-13 LAB — T3: T3, Total: 97 ng/dL (ref 71–180)

## 2015-07-13 LAB — T4: T4, Total: 5.8 ug/dL (ref 4.5–12.0)

## 2015-07-16 NOTE — Telephone Encounter (Signed)
This encounter was created in error - please disregard.

## 2015-07-17 NOTE — Progress Notes (Signed)
Mailed letter with results. I called and left message a few times. [facility]

## 2015-07-18 ENCOUNTER — Telehealth: Payer: Self-pay

## 2015-07-18 NOTE — Telephone Encounter (Signed)
Advise appt 08/01/2015 10:15  Dr. Sonny Dandy ENT.

## 2015-07-24 ENCOUNTER — Telehealth: Payer: Self-pay | Admitting: *Deleted

## 2015-07-24 NOTE — Telephone Encounter (Signed)
Request from [place] , sent to [place] on  07/24/15.

## 2015-08-01 DIAGNOSIS — J301 Allergic rhinitis due to pollen: Secondary | ICD-10-CM | POA: Diagnosis not present

## 2015-08-01 DIAGNOSIS — H903 Sensorineural hearing loss, bilateral: Secondary | ICD-10-CM | POA: Diagnosis not present

## 2015-08-01 DIAGNOSIS — H9319 Tinnitus, unspecified ear: Secondary | ICD-10-CM | POA: Diagnosis not present

## 2015-09-19 ENCOUNTER — Encounter: Payer: Self-pay | Admitting: Family Medicine

## 2015-09-19 ENCOUNTER — Ambulatory Visit (INDEPENDENT_AMBULATORY_CARE_PROVIDER_SITE_OTHER): Payer: Medicare Other | Admitting: Family Medicine

## 2015-09-19 VITALS — BP 149/91 | HR 62 | Temp 98.1°F | Resp 16 | Ht 71.0 in | Wt 184.0 lb

## 2015-09-19 DIAGNOSIS — L989 Disorder of the skin and subcutaneous tissue, unspecified: Secondary | ICD-10-CM | POA: Diagnosis not present

## 2015-09-19 NOTE — Progress Notes (Signed)
Name: Andrew Benson   MRN: [id number]    DOB: 21-Oct-1946   Date:09/19/2015       Progress Note  Subjective  Chief Complaint  Chief Complaint  Patient presents with  . Rash    left arm    HPI  Has "rash" under L axilla for x 4-5 days.  Feels raw.   No problem-specific assessment & plan notes found for this encounter.   Past Medical History  Diagnosis Date  . Asthma   . Hyperlipidemia   . Arrhythmia   . A-fib     Social History  Substance Use Topics  . Smoking status: Former Smoker -- 1.00 packs/day for 20 years    Types: Cigarettes    Quit date: 02/01/1993  . Smokeless tobacco: Never Used  . Alcohol Use: 0.0 oz/week    0 Standard drinks or equivalent per week     Comment: social drinker.     Current outpatient prescriptions:  .  aspirin EC 81 MG tablet, Take 81 mg by mouth., Disp: , Rfl:  .  rosuvastatin (CRESTOR) 10 MG tablet, Take 1 tablet (10 mg total) by mouth daily., Disp: 30 tablet, Rfl: 6  Allergies  Allergen Reactions  . Penicillins     Rash Rash     Review of Systems  Constitutional: Negative for fever and chills.  Eyes: Positive for blurred vision and double vision.  Respiratory: Negative for cough, sputum production, shortness of breath and wheezing.   Cardiovascular: Negative for chest pain, palpitations, orthopnea and leg swelling.  Gastrointestinal: Negative for heartburn, abdominal pain and blood in stool.  Genitourinary: Negative for dysuria, urgency and frequency.  Skin: Positive for rash (L axilla). Negative for itching.  Neurological: Negative for dizziness, sensory change and focal weakness.      Objective  Filed Vitals:   09/19/15 1558  BP: 149/91  Pulse: 62  Temp: 98.1 F (36.7 C)  TempSrc: Oral  Resp: 16  Height: 5\' 11"  (1.803 m)  Weight: 184 lb (83.462 kg)     Physical Exam  Constitutional: He is well-developed, well-nourished, and in no distress. No distress.  Lymphadenopathy:    He has cervical adenopathy.   Skin:  8-9 mm red area that is sl. Raised with central dark area.  Not tender to palp., but is raw feelong when deodorant applied.  No adenopahy  Vitals reviewed.     Recent Results (from the past 2160 hour(s))  Comprehensive Metabolic Panel (CMET)     Status: Abnormal   Collection Time: 07/12/15 10:18 AM  Result Value Ref Range   Glucose 94 65 - 99 mg/dL   BUN 18 8 - 27 mg/dL   Creatinine, Ser 1.29 (H) 0.76 - 1.27 mg/dL   GFR calc non Af Amer 57 (L) >59 mL/min/1.73   GFR calc Af Amer 65 >59 mL/min/1.73   BUN/Creatinine Ratio 14 10 - 22   Sodium 142 134 - 144 mmol/L   Potassium 4.2 3.5 - 5.2 mmol/L   Chloride 104 97 - 108 mmol/L   CO2 25 18 - 29 mmol/L   Calcium 9.1 8.6 - 10.2 mg/dL   Total Protein 6.5 6.0 - 8.5 g/dL   Albumin 4.0 3.6 - 4.8 g/dL   Globulin, Total 2.5 1.5 - 4.5 g/dL   Albumin/Globulin Ratio 1.6 1.1 - 2.5   Bilirubin Total 0.7 0.0 - 1.2 mg/dL   Alkaline Phosphatase 87 39 - 117 IU/L   AST 29 0 - 40 IU/L   ALT 29 0 -  44 IU/L  TSH     Status: None   Collection Time: 07/12/15 10:18 AM  Result Value Ref Range   TSH 0.495 0.450 - 4.500 uIU/mL  T4     Status: None   Collection Time: 07/12/15 10:18 AM  Result Value Ref Range   T4, Total 5.8 4.5 - 12.0 ug/dL  T3     Status: None   Collection Time: 07/12/15 10:18 AM  Result Value Ref Range   T3, Total 97 71 - 180 ng/dL     Assessment & Plan  1. Skin lesion  - Ambulatory referral to Dermatology

## 2015-09-25 ENCOUNTER — Telehealth: Payer: Self-pay | Admitting: *Deleted

## 2015-09-25 ENCOUNTER — Encounter: Payer: Self-pay | Admitting: *Deleted

## 2015-09-25 NOTE — Telephone Encounter (Signed)
Patient had a question in regards to creatinine/BUN/Protein levels. He received a letter from his insurance stating something was in 500s. I asked patient to bring letter by the office and we would make a copy of it.

## 2015-10-22 ENCOUNTER — Ambulatory Visit: Payer: Self-pay | Admitting: Family Medicine

## 2015-11-07 ENCOUNTER — Encounter: Payer: Self-pay | Admitting: Family Medicine

## 2015-11-07 ENCOUNTER — Ambulatory Visit (INDEPENDENT_AMBULATORY_CARE_PROVIDER_SITE_OTHER): Payer: Medicare Other | Admitting: Family Medicine

## 2015-11-07 VITALS — BP 140/80 | HR 60 | Temp 97.7°F | Resp 16 | Ht 71.0 in | Wt 183.8 lb

## 2015-11-07 DIAGNOSIS — N289 Disorder of kidney and ureter, unspecified: Secondary | ICD-10-CM | POA: Diagnosis not present

## 2015-11-07 DIAGNOSIS — I480 Paroxysmal atrial fibrillation: Secondary | ICD-10-CM | POA: Diagnosis not present

## 2015-11-07 DIAGNOSIS — E785 Hyperlipidemia, unspecified: Secondary | ICD-10-CM | POA: Diagnosis not present

## 2015-11-07 DIAGNOSIS — N138 Other obstructive and reflux uropathy: Secondary | ICD-10-CM

## 2015-11-07 DIAGNOSIS — N401 Enlarged prostate with lower urinary tract symptoms: Secondary | ICD-10-CM | POA: Diagnosis not present

## 2015-11-07 LAB — CBC WITH DIFFERENTIAL/PLATELET
BASOS ABS: 0 10*3/uL (ref 0.0–0.2)
Basos: 1 %
EOS (ABSOLUTE): 0.1 10*3/uL (ref 0.0–0.4)
EOS: 3 %
HEMATOCRIT: 40.4 % (ref 37.5–51.0)
Hemoglobin: 14.6 g/dL (ref 12.6–17.7)
IMMATURE GRANULOCYTES: 0 %
Immature Grans (Abs): 0 10*3/uL (ref 0.0–0.1)
LYMPHS ABS: 2 10*3/uL (ref 0.7–3.1)
Lymphs: 45 %
MCH: 30.5 pg (ref 26.6–33.0)
MCHC: 36.1 g/dL — ABNORMAL HIGH (ref 31.5–35.7)
MCV: 85 fL (ref 79–97)
MONOS ABS: 0.3 10*3/uL (ref 0.1–0.9)
Monocytes: 7 %
NEUTROS PCT: 44 %
Neutrophils Absolute: 1.9 10*3/uL (ref 1.4–7.0)
PLATELETS: 136 10*3/uL — AB (ref 150–379)
RBC: 4.78 x10E6/uL (ref 4.14–5.80)
RDW: 13.7 % (ref 12.3–15.4)
WBC: 4.4 10*3/uL (ref 3.4–10.8)

## 2015-11-07 NOTE — Progress Notes (Signed)
Name: Andrew Benson   MRN: [id number]    DOB: March 10, 1946   Date:11/07/2015       Progress Note  Subjective  Chief Complaint  Chief Complaint  Patient presents with  . Hyperlipidemia    HPI  Here for f/u of hyperlipidemia.  Taking Crestor and fish oil daily.  Also has had some renal insufficiency.  No atr. Fib in 2 years at least. No problem-specific assessment & plan notes found for this encounter.   Past Medical History  Diagnosis Date  . Asthma   . Hyperlipidemia   . Arrhythmia   . A-fib [facility])     Social History  Substance Use Topics  . Smoking status: Former Smoker -- 1.00 packs/day for 20 years    Types: Cigarettes    Quit date: 02/01/1993  . Smokeless tobacco: Never Used  . Alcohol Use: 0.0 oz/week    0 Standard drinks or equivalent per week     Comment: social drinker.     Current outpatient prescriptions:  .  aspirin EC 81 MG tablet, Take 81 mg by mouth., Disp: , Rfl:  .  rosuvastatin (CRESTOR) 10 MG tablet, Take 1 tablet (10 mg total) by mouth daily., Disp: 30 tablet, Rfl: 6  Allergies  Allergen Reactions  . Penicillins     Rash Rash     Review of Systems  Constitutional: Negative for fever, chills, weight loss and malaise/fatigue.  HENT: Negative for hearing loss.   Eyes: Negative for blurred vision and double vision.  Respiratory: Negative for cough, shortness of breath and wheezing.   Cardiovascular: Negative for chest pain, palpitations and leg swelling.  Gastrointestinal: Negative for heartburn, abdominal pain and blood in stool.  Genitourinary: Negative for dysuria, urgency and frequency.  Musculoskeletal: Negative for myalgias and joint pain.  Skin: Negative for rash.  Neurological: Negative for dizziness, tremors, weakness and headaches.      Objective  Filed Vitals:   11/07/15 0917  BP: 143/84  Pulse: 50  Temp: 97.7 F (36.5 C)  Resp: 16  Height: 5\' 11"  (1.803 m)  Weight: 183 lb 12.8 oz (83.371 kg)     Physical Exam   Constitutional: He is oriented to person, place, and time and well-developed, well-nourished, and in no distress. No distress.  HENT:  Head: Normocephalic and atraumatic.  Eyes: Conjunctivae and EOM are normal. Pupils are equal, round, and reactive to light. No scleral icterus.  Neck: Normal range of motion. Neck supple. No thyromegaly present.  Cardiovascular: Normal rate, regular rhythm, normal heart sounds and intact distal pulses.  Exam reveals no gallop and no friction rub.   No murmur heard. Pulmonary/Chest: Effort normal and breath sounds normal. No respiratory distress. He has no wheezes. He has no rales.  Abdominal: Soft. Bowel sounds are normal. He exhibits no distension and no mass. There is no tenderness.  Musculoskeletal: Normal range of motion. He exhibits no edema.  Lymphadenopathy:    He has no cervical adenopathy.  Neurological: He is alert and oriented to person, place, and time.  Vitals reviewed.     No results found for this or any previous visit (from the past 2160 hour(s)).   Assessment & Plan  1. Hyperlipidemia -cont.Crestor - Comprehensive Metabolic Panel (CMET) - CBC with Differential - Lipid Profile  2. Renal insufficiency  - Comprehensive Metabolic Panel (CMET)  3. Benign prostatic hyperplasia with urinary obstruction   4. Paroxysmal atrial fibrillation (HCC)

## 2015-11-07 NOTE — Patient Instructions (Signed)
Declines flu shot.  Says it always makes him feel bad.

## 2015-11-08 LAB — COMPREHENSIVE METABOLIC PANEL
ALBUMIN: 4 g/dL (ref 3.6–4.8)
ALT: 21 IU/L (ref 0–44)
AST: 30 IU/L (ref 0–40)
Albumin/Globulin Ratio: 1.7 (ref 1.1–2.5)
Alkaline Phosphatase: 79 IU/L (ref 39–117)
BUN / CREAT RATIO: 12 (ref 10–22)
BUN: 16 mg/dL (ref 8–27)
Bilirubin Total: 1 mg/dL (ref 0.0–1.2)
CALCIUM: 9.3 mg/dL (ref 8.6–10.2)
CO2: 27 mmol/L (ref 18–29)
Chloride: 104 mmol/L (ref 97–106)
Creatinine, Ser: 1.3 mg/dL — ABNORMAL HIGH (ref 0.76–1.27)
GFR calc non Af Amer: 56 mL/min/{1.73_m2} — ABNORMAL LOW (ref >59)
GFR, EST AFRICAN AMERICAN: 65 mL/min/{1.73_m2} (ref >59)
Globulin, Total: 2.4 g/dL (ref 1.5–4.5)
Glucose: 80 mg/dL (ref 65–99)
Potassium: 4.2 mmol/L (ref 3.5–5.2)
Sodium: 141 mmol/L (ref 136–144)
Total Protein: 6.4 g/dL (ref 6.0–8.5)

## 2015-11-08 LAB — LIPID PANEL
Chol/HDL Ratio: 2.7 ratio units (ref 0.0–5.0)
Cholesterol, Total: 152 mg/dL (ref 100–199)
HDL: 56 mg/dL (ref >39)
LDL CALC: 83 mg/dL (ref 0–99)
Triglycerides: 64 mg/dL (ref 0–149)
VLDL Cholesterol Cal: 13 mg/dL (ref 5–40)

## 2015-11-11 MED ORDER — LISINOPRIL 5 MG PO TABS: 5.0000 mg | ORAL_TABLET | Freq: Every day | ORAL | Status: DC

## 2015-11-11 NOTE — Addendum Note (Signed)
Addended by: Devona Konig on: 11/11/2015 09:13 AM   Modules accepted: Orders

## 2016-01-10 ENCOUNTER — Encounter: Payer: Self-pay | Admitting: Nurse Practitioner

## 2016-01-10 ENCOUNTER — Other Ambulatory Visit: Payer: Self-pay

## 2016-01-10 ENCOUNTER — Ambulatory Visit (INDEPENDENT_AMBULATORY_CARE_PROVIDER_SITE_OTHER): Payer: Medicare Other | Admitting: Nurse Practitioner

## 2016-01-10 DIAGNOSIS — N182 Chronic kidney disease, stage 2 (mild): Secondary | ICD-10-CM | POA: Diagnosis not present

## 2016-01-10 DIAGNOSIS — E785 Hyperlipidemia, unspecified: Secondary | ICD-10-CM | POA: Diagnosis not present

## 2016-01-10 DIAGNOSIS — I480 Paroxysmal atrial fibrillation: Secondary | ICD-10-CM | POA: Diagnosis not present

## 2016-01-10 DIAGNOSIS — I4891 Unspecified atrial fibrillation: Secondary | ICD-10-CM | POA: Diagnosis not present

## 2016-01-10 DIAGNOSIS — I10 Essential (primary) hypertension: Secondary | ICD-10-CM

## 2016-01-10 DIAGNOSIS — N183 Chronic kidney disease, stage 3 unspecified: Secondary | ICD-10-CM | POA: Insufficient documentation

## 2016-01-10 DIAGNOSIS — I129 Hypertensive chronic kidney disease with stage 1 through stage 4 chronic kidney disease, or unspecified chronic kidney disease: Secondary | ICD-10-CM | POA: Insufficient documentation

## 2016-01-10 MED ORDER — AMIODARONE HCL 200 MG PO TABS: 200.0000 mg | ORAL_TABLET | Freq: Two times a day (BID) | ORAL | Status: DC | PRN

## 2016-01-10 MED ORDER — METOPROLOL TARTRATE 25 MG PO TABS: 25.0000 mg | ORAL_TABLET | Freq: Two times a day (BID) | ORAL | Status: DC | PRN

## 2016-01-10 MED ORDER — APIXABAN 5 MG PO TABS: 5.0000 mg | ORAL_TABLET | Freq: Two times a day (BID) | ORAL | Status: DC | PRN

## 2016-01-10 NOTE — Progress Notes (Signed)
Office Visit    Patient Name: Andrew Benson Date of Encounter: 01/10/2016  Primary Care Provider:  Dicky Doe, MD Primary Cardiologist:  Johnny Bridge, MD   Chief Complaint    70 year old male with prior history of paroxysmal atrial fibrillation, who presents for follow-up.  Past Medical History    Past Medical History  Diagnosis Date  . Asthma   . Hyperlipidemia   . CKD (chronic kidney disease), stage II   . PAF (paroxysmal atrial fibrillation) (HCC)     a. CHA2DS2VASc = 2;  b. takes amio/eliquis/metoprolol prn for PAF.  Marland Kitchen Essential hypertension    Past Surgical History  Procedure Laterality Date  . Knee surgery      left   . Hernia repair    . Finger surgery      left index finger    Allergies  Allergies  Allergen Reactions  . Penicillins     Rash Rash     History of Present Illness    70 year old male with the above complex past medical history. He has a history of paroxysmal atrial fibrillation, last occurring approximately 2 years ago. He has been doing well from this standpoint without any recurrence of palpitations or known A. fib. He does have amiodarone 200 mg as well as eliquis 5 mg at home and has been instructed in the past to use this as needed for recurrent palpitations/A. Fib.  He also has a history of stable, stage II chronic kidney disease with baseline creatinine in the 1.3 range. This is followed by his primary care provider and he is on an ACE inhibitor. He notes that he recently stopped taking Crestor because he had significant improvement in his total cholesterol to 152. We discussed today how that was achieved by taking Crestor and he plans to go back on it.  He is a very active gentleman and denies chest pain, palpitations, PND, orthopnea, dizziness, edema, syncope, or early satiety.  Home Medications    Prior to Admission medications   Medication Sig Start Date End Date Taking? Authorizing Provider  amiodarone (PACERONE) 200 MG  tablet Take 200 mg by mouth 2 (two) times daily as needed (for afib).   Yes Historical Provider, MD  aspirin EC 81 MG tablet Take 81 mg by mouth daily.  01/07/06  Yes Historical Provider, MD  lisinopril (PRINIVIL,ZESTRIL) 5 MG tablet Take 1 tablet (5 mg total) by mouth daily. 11/11/15  Yes Arlis Porta., MD  rosuvastatin (CRESTOR) 10 MG tablet Take 1 tablet (10 mg total) by mouth daily. 07/01/15  Yes Minna Merritts, MD    Review of Systems   As above, he's been doing very well.   He denies chest pain, palpitations, dyspnea, pnd, orthopnea, n, v, dizziness, syncope, edema, weight gain, or early satiety.  All other systems reviewed and are otherwise negative except as noted above.  Physical Exam    VS:  BP 116/80 mmHg  Pulse 60  Ht 5\' 11"  (1.803 m)  Wt 183 lb 12.8 oz (83.371 kg)  BMI 25.65 kg/m2 , BMI Body mass index is 25.65 kg/(m^2). GEN: Well nourished, well developed, in no acute distress. HEENT: normal. Neck: Supple, no JVD, carotid bruits, or masses. Cardiac: RRR, no murmurs, rubs, or gallops. No clubbing, cyanosis, edema.  Radials/DP/PT 2+ and equal bilaterally.  Respiratory:  Respirations regular and unlabored, clear to auscultation bilaterally. GI: Soft, nontender, nondistended, BS + x 4. MS: no deformity or atrophy. Skin: warm and dry, no  rash. Neuro:  Strength and sensation are intact. Psych: Normal affect.  Accessory Clinical Findings    ECG - regular sinus rhythm, 60, lateral T-wave flattening, no acute ST-T changes.   Assessment & Plan    1.  Paroxysmal atrial fibrillation: Patient has been doing well over the past year without any recurrence of atrial fibrillation or palpitations. He has amiodarone, metoprolol, and eliquis as home and has previously been instructed to take this as needed for recurrent palpitations. He has not had to use this. He needs refills today. He has a CHA2DS2VASc of 2 including history of hypertension and age: 24. We discussed the role of  oral anticoagulation in prevention of stroke in patients with paroxysmal atrial fibrillation and also that patients with paroxysmal atrial fibrillation may have asymptomatic A. fib. I suggested that if he has any recurrence of A. fib, we would likely recommend daily oral anticoagulation. He recently had labs with his primary care provider showing stable creatinine, normal electrolytes, normal CBC, normal LFTs, and normal TSH.   2. Essential hypertension: Stable on lisinopril therapy.  3. Stage II chronic kidney disease: Creatinine was recently stable at 1.3. We had a long discussion about chronic kidney disease, staging, and therapy including ACE inhibitor's.  4. Hyperlipidemia: He had been taking Crestor 10 mg daily. Recent lipid profile showed total cholesterol 152. He was encouraged by this and stopped taking his Crestor. We discussed how that level was achieved by taking Crestor and he plans to go back on it.   5. Disposition: Follow-up in one year.   Murray Hodgkins, NP 01/10/2016, 9:01 AM

## 2016-01-10 NOTE — Patient Instructions (Signed)
Medication Instructions:  Your physician recommends that you continue on your current medications as directed. Please refer to the Current Medication list given to you today.   Labwork: none  Testing/Procedures: none  Follow-Up: Your physician wants you to follow-up in: one year with Dr. Gollan.  You will receive a reminder letter in the mail two months in advance. If you don't receive a letter, please call our office to schedule the follow-up appointment.   Any Other Special Instructions Will Be Listed Below (If Applicable).     If you need a refill on your cardiac medications before your next appointment, please call your pharmacy.   

## 2016-01-14 ENCOUNTER — Telehealth: Payer: Self-pay | Admitting: *Deleted

## 2016-01-14 NOTE — Telephone Encounter (Signed)
Pt has been approved for Eliquis 5 mg 12/22/2015-01/09/19.

## 2016-04-07 ENCOUNTER — Telehealth: Payer: Self-pay | Admitting: Family Medicine

## 2016-04-07 ENCOUNTER — Encounter: Payer: Self-pay | Admitting: Nurse Practitioner

## 2016-04-07 ENCOUNTER — Other Ambulatory Visit: Payer: Self-pay

## 2016-04-07 MED ORDER — ROSUVASTATIN CALCIUM 10 MG PO TABS: 10.0000 mg | ORAL_TABLET | Freq: Every day | ORAL | Status: DC

## 2016-04-07 NOTE — Telephone Encounter (Signed)
Returned call to patient let him know Dr. Donivan Scull name is on Crestor. He needs to contact cardiology 1st. If he has any trouble with getting this then call back and we will send to Dr. Luan Pulling. Patient's last bloodwork was 10/2015 and he was suppose to follow up in 4 mos 3/17. Patient wants to want until June and has scheduled 05/25/16.

## 2016-04-07 NOTE — Telephone Encounter (Signed)
Pt has been taking the generic of crestor.  He said it has given him a rash and he has trouble sleeping.  He would like to go back to crestor.  He also asked when his last lab work was done.  His call back number is [phone]

## 2016-04-08 ENCOUNTER — Other Ambulatory Visit: Payer: Self-pay

## 2016-04-08 MED ORDER — CRESTOR 10 MG PO TABS: 10.0000 mg | ORAL_TABLET | Freq: Every day | ORAL | Status: DC

## 2016-04-17 DIAGNOSIS — N529 Male erectile dysfunction, unspecified: Secondary | ICD-10-CM | POA: Diagnosis not present

## 2016-04-17 DIAGNOSIS — N401 Enlarged prostate with lower urinary tract symptoms: Secondary | ICD-10-CM | POA: Diagnosis not present

## 2016-04-17 DIAGNOSIS — N138 Other obstructive and reflux uropathy: Secondary | ICD-10-CM | POA: Diagnosis not present

## 2016-04-17 DIAGNOSIS — Z6825 Body mass index (BMI) 25.0-25.9, adult: Secondary | ICD-10-CM | POA: Diagnosis not present

## 2016-04-17 DIAGNOSIS — R339 Retention of urine, unspecified: Secondary | ICD-10-CM | POA: Diagnosis not present

## 2016-04-17 DIAGNOSIS — N433 Hydrocele, unspecified: Secondary | ICD-10-CM | POA: Diagnosis not present

## 2016-04-17 DIAGNOSIS — N4340 Spermatocele of epididymis, unspecified: Secondary | ICD-10-CM | POA: Diagnosis not present

## 2016-04-20 DIAGNOSIS — R339 Retention of urine, unspecified: Secondary | ICD-10-CM | POA: Insufficient documentation

## 2016-04-30 ENCOUNTER — Encounter: Payer: Self-pay | Admitting: Family Medicine

## 2016-04-30 ENCOUNTER — Ambulatory Visit (INDEPENDENT_AMBULATORY_CARE_PROVIDER_SITE_OTHER): Payer: Medicare Other | Admitting: Family Medicine

## 2016-04-30 VITALS — BP 143/89 | HR 68 | Temp 98.5°F | Resp 16 | Ht 71.0 in | Wt 186.0 lb

## 2016-04-30 DIAGNOSIS — E785 Hyperlipidemia, unspecified: Secondary | ICD-10-CM

## 2016-04-30 DIAGNOSIS — I10 Essential (primary) hypertension: Secondary | ICD-10-CM | POA: Diagnosis not present

## 2016-04-30 DIAGNOSIS — I480 Paroxysmal atrial fibrillation: Secondary | ICD-10-CM | POA: Diagnosis not present

## 2016-04-30 DIAGNOSIS — L309 Dermatitis, unspecified: Secondary | ICD-10-CM | POA: Diagnosis not present

## 2016-04-30 DIAGNOSIS — N182 Chronic kidney disease, stage 2 (mild): Secondary | ICD-10-CM

## 2016-04-30 NOTE — Progress Notes (Signed)
Name: Andrew Benson   MRN: [id number]    DOB: 08-03-1946   Date:04/30/2016       Progress Note  Subjective  Chief Complaint  Chief Complaint  Patient presents with  . itchy skin    HPI Rash and itching x 1 month.  Started on back and c hest. Spreads.  Now on bilateral arms, shoulders, abd. Waist, legs buttocks.  Itches and are tender.  Vaseline and Eucerin havew not helped much.  Rash is bumpy No problem-specific assessment & plan notes found for this encounter.   Past Medical History  Diagnosis Date  . Asthma   . Hyperlipidemia   . CKD (chronic kidney disease), stage II   . PAF (paroxysmal atrial fibrillation) (HCC)     a. CHA2DS2VASc = 2;  b. takes amio/eliquis/metoprolol prn for PAF.  Marland Kitchen Essential hypertension     Social History  Substance Use Topics  . Smoking status: Former Smoker -- 1.00 packs/day for 20 years    Types: Cigarettes    Quit date: 02/01/1993  . Smokeless tobacco: Never Used  . Alcohol Use: 0.0 oz/week    0 Standard drinks or equivalent per week     Comment: social drinker.     Current outpatient prescriptions:  .  amiodarone (PACERONE) 200 MG tablet, Take 1 tablet (200 mg total) by mouth 2 (two) times daily as needed (for afib)., Disp: 30 tablet, Rfl: 2 .  apixaban (ELIQUIS) 5 MG TABS tablet, Take 1 tablet (5 mg total) by mouth 2 (two) times daily as needed (for afib)., Disp: 60 tablet, Rfl: 2 .  aspirin EC 81 MG tablet, Take 81 mg by mouth daily. , Disp: , Rfl:  .  CRESTOR 10 MG tablet, Take 1 tablet (10 mg total) by mouth daily., Disp: 30 tablet, Rfl: 6 .  lisinopril (PRINIVIL,ZESTRIL) 5 MG tablet, Take 1 tablet (5 mg total) by mouth daily., Disp: 30 tablet, Rfl: 6 .  metoprolol tartrate (LOPRESSOR) 25 MG tablet, Take 1 tablet (25 mg total) by mouth 2 (two) times daily as needed (for afib)., Disp: 60 tablet, Rfl: 2  Allergies  Allergen Reactions  . Penicillins     Rash Rash     Review of Systems  Constitutional: Negative for fever, chills,  weight loss and malaise/fatigue.  HENT: Negative for hearing loss.   Eyes: Negative for blurred vision and double vision.  Respiratory: Negative for cough, shortness of breath and wheezing.   Cardiovascular: Negative for chest pain, palpitations and leg swelling.  Gastrointestinal: Negative for heartburn, abdominal pain and blood in stool.  Genitourinary: Negative for dysuria, urgency and frequency.  Skin: Positive for itching and rash.  Neurological: Negative for tremors, weakness and headaches.      Objective  Filed Vitals:   04/30/16 0841  BP: 143/89  Pulse: 68  Temp: 98.5 F (36.9 C)  TempSrc: Oral  Resp: 16  Height: 5\' 11"  (1.803 m)  Weight: 186 lb (84.369 kg)     Physical Exam  Constitutional: He is oriented to person, place, and time and well-developed, well-nourished, and in no distress. No distress.  HENT:  Head: Normocephalic and atraumatic.  Cardiovascular: Normal rate, regular rhythm and normal heart sounds.  Exam reveals no gallop and no friction rub.   No murmur heard. Pulmonary/Chest: Effort normal and breath sounds normal. No respiratory distress. He has no wheezes. He has no rales.  Musculoskeletal: He exhibits no edema.  Neurological: He is alert and oriented to person, place, and time.  Skin:  Patches of dry scaly skin with some excoriation esp around waist and on buttoucks.. Some on R lat. Leg, bilateral shoulders, chest, abd.  Vitals reviewed.     No results found for this or any previous visit (from the past 2160 hour(s)).   Assessment & Plan 1. Eczema Rx written for 1% hydrocortisone cream in Eucerin cream to be applied  2-3 times a day.  2. Essential hypertension  - Comprehensive Metabolic Panel (CMET)  3. PAF (paroxysmal atrial fibrillation) ([place])   4. CKD (chronic kidney disease), stage II  - CBC with Differential  5. Hyperlipidemia  - Lipid Profile

## 2016-05-01 DIAGNOSIS — N182 Chronic kidney disease, stage 2 (mild): Secondary | ICD-10-CM | POA: Diagnosis not present

## 2016-05-01 DIAGNOSIS — I10 Essential (primary) hypertension: Secondary | ICD-10-CM | POA: Diagnosis not present

## 2016-05-01 DIAGNOSIS — E785 Hyperlipidemia, unspecified: Secondary | ICD-10-CM | POA: Diagnosis not present

## 2016-05-02 LAB — CBC WITH DIFFERENTIAL/PLATELET
BASOS ABS: 0 10*3/uL (ref 0.0–0.2)
Basos: 1 %
EOS (ABSOLUTE): 0.2 10*3/uL (ref 0.0–0.4)
Eos: 5 %
Hematocrit: 41.2 % (ref 37.5–51.0)
Hemoglobin: 14.8 g/dL (ref 12.6–17.7)
IMMATURE GRANULOCYTES: 0 %
Immature Grans (Abs): 0 10*3/uL (ref 0.0–0.1)
Lymphocytes Absolute: 1.5 10*3/uL (ref 0.7–3.1)
Lymphs: 36 %
MCH: 30.7 pg (ref 26.6–33.0)
MCHC: 35.9 g/dL — ABNORMAL HIGH (ref 31.5–35.7)
MCV: 86 fL (ref 79–97)
MONOS ABS: 0.4 10*3/uL (ref 0.1–0.9)
Monocytes: 10 %
NEUTROS PCT: 48 %
Neutrophils Absolute: 2.1 10*3/uL (ref 1.4–7.0)
PLATELETS: 129 10*3/uL — AB (ref 150–379)
RBC: 4.82 x10E6/uL (ref 4.14–5.80)
RDW: 14.8 % (ref 12.3–15.4)
WBC: 4.3 10*3/uL (ref 3.4–10.8)

## 2016-05-02 LAB — COMPREHENSIVE METABOLIC PANEL
A/G RATIO: 1.6 (ref 1.2–2.2)
ALK PHOS: 78 IU/L (ref 39–117)
ALT: 24 IU/L (ref 0–44)
AST: 23 IU/L (ref 0–40)
Albumin: 3.8 g/dL (ref 3.6–4.8)
BUN/Creatinine Ratio: 13 (ref 10–24)
BUN: 17 mg/dL (ref 8–27)
Bilirubin Total: 0.9 mg/dL (ref 0.0–1.2)
CO2: 24 mmol/L (ref 18–29)
Calcium: 9.2 mg/dL (ref 8.6–10.2)
Chloride: 105 mmol/L (ref 96–106)
Creatinine, Ser: 1.26 mg/dL (ref 0.76–1.27)
GFR calc Af Amer: 67 mL/min/{1.73_m2} (ref >59)
GFR calc non Af Amer: 58 mL/min/{1.73_m2} — ABNORMAL LOW (ref >59)
GLOBULIN, TOTAL: 2.4 g/dL (ref 1.5–4.5)
Glucose: 95 mg/dL (ref 65–99)
POTASSIUM: 4.3 mmol/L (ref 3.5–5.2)
SODIUM: 142 mmol/L (ref 134–144)
Total Protein: 6.2 g/dL (ref 6.0–8.5)

## 2016-05-02 LAB — LIPID PANEL
Chol/HDL Ratio: 4.1 ratio units (ref 0.0–5.0)
Cholesterol, Total: 227 mg/dL — ABNORMAL HIGH (ref 100–199)
HDL: 56 mg/dL (ref >39)
LDL Calculated: 150 mg/dL — ABNORMAL HIGH (ref 0–99)
TRIGLYCERIDES: 105 mg/dL (ref 0–149)
VLDL Cholesterol Cal: 21 mg/dL (ref 5–40)

## 2016-05-04 ENCOUNTER — Other Ambulatory Visit: Payer: Self-pay | Admitting: Family Medicine

## 2016-05-04 DIAGNOSIS — E785 Hyperlipidemia, unspecified: Secondary | ICD-10-CM

## 2016-05-04 MED ORDER — ROSUVASTATIN CALCIUM 20 MG PO TABS: 20.0000 mg | ORAL_TABLET | Freq: Every day | ORAL | Status: DC

## 2016-05-07 DIAGNOSIS — Z9841 Cataract extraction status, right eye: Secondary | ICD-10-CM | POA: Diagnosis not present

## 2016-05-07 DIAGNOSIS — Z9842 Cataract extraction status, left eye: Secondary | ICD-10-CM | POA: Diagnosis not present

## 2016-05-19 ENCOUNTER — Telehealth: Payer: Self-pay | Admitting: Family Medicine

## 2016-05-19 DIAGNOSIS — R21 Rash and other nonspecific skin eruption: Secondary | ICD-10-CM

## 2016-05-19 NOTE — Telephone Encounter (Signed)
Probably needs to go ahead and see a Dermatolgist.  If he does not have one already, would recommend that we refer to Dr. Phillip Heal in [facility] for evaluation and treatment of rash (? excema).-jh

## 2016-05-19 NOTE — Telephone Encounter (Signed)
Referral entered.[place] 

## 2016-05-19 NOTE — Telephone Encounter (Signed)
Pt. Called states that cream that was given to him was not working, was not sure if he need to come in are need a referral. Pt call back # is [phone]

## 2016-05-20 DIAGNOSIS — L438 Other lichen planus: Secondary | ICD-10-CM | POA: Diagnosis not present

## 2016-05-20 DIAGNOSIS — L309 Dermatitis, unspecified: Secondary | ICD-10-CM | POA: Diagnosis not present

## 2016-05-20 DIAGNOSIS — L258 Unspecified contact dermatitis due to other agents: Secondary | ICD-10-CM | POA: Diagnosis not present

## 2016-05-25 ENCOUNTER — Ambulatory Visit: Payer: Self-pay | Admitting: Family Medicine

## 2016-05-31 ENCOUNTER — Other Ambulatory Visit: Payer: Self-pay | Admitting: Family Medicine

## 2016-06-19 DIAGNOSIS — L438 Other lichen planus: Secondary | ICD-10-CM | POA: Diagnosis not present

## 2016-08-25 ENCOUNTER — Other Ambulatory Visit: Payer: Self-pay

## 2016-09-01 ENCOUNTER — Encounter: Payer: Self-pay | Admitting: Family Medicine

## 2016-09-10 ENCOUNTER — Encounter: Payer: Self-pay | Admitting: Family Medicine

## 2016-10-13 ENCOUNTER — Other Ambulatory Visit: Payer: Self-pay | Admitting: Family Medicine

## 2016-10-13 ENCOUNTER — Ambulatory Visit (INDEPENDENT_AMBULATORY_CARE_PROVIDER_SITE_OTHER): Payer: Medicare Other | Admitting: Family Medicine

## 2016-10-13 ENCOUNTER — Encounter: Payer: Self-pay | Admitting: Family Medicine

## 2016-10-13 VITALS — BP 124/69 | HR 64 | Temp 98.5°F | Resp 16 | Ht 72.0 in | Wt 176.0 lb

## 2016-10-13 DIAGNOSIS — N401 Enlarged prostate with lower urinary tract symptoms: Secondary | ICD-10-CM

## 2016-10-13 DIAGNOSIS — K640 First degree hemorrhoids: Secondary | ICD-10-CM

## 2016-10-13 DIAGNOSIS — N138 Other obstructive and reflux uropathy: Secondary | ICD-10-CM | POA: Diagnosis not present

## 2016-10-13 DIAGNOSIS — I480 Paroxysmal atrial fibrillation: Secondary | ICD-10-CM

## 2016-10-13 DIAGNOSIS — I10 Essential (primary) hypertension: Secondary | ICD-10-CM | POA: Diagnosis not present

## 2016-10-13 DIAGNOSIS — N289 Disorder of kidney and ureter, unspecified: Secondary | ICD-10-CM

## 2016-10-13 DIAGNOSIS — R7989 Other specified abnormal findings of blood chemistry: Secondary | ICD-10-CM

## 2016-10-13 DIAGNOSIS — R946 Abnormal results of thyroid function studies: Secondary | ICD-10-CM

## 2016-10-13 DIAGNOSIS — E785 Hyperlipidemia, unspecified: Secondary | ICD-10-CM

## 2016-10-13 DIAGNOSIS — Z23 Encounter for immunization: Secondary | ICD-10-CM

## 2016-10-13 MED ORDER — HYDROCORTISONE 2.5 % RE CREA: 1.0000 "application " | TOPICAL_CREAM | Freq: Two times a day (BID) | RECTAL | 0 refills | Status: DC

## 2016-10-13 NOTE — Patient Instructions (Signed)
Patient requested regular dose flu shot as opposed to high dose.

## 2016-10-13 NOTE — Progress Notes (Signed)
Name: Andrew Benson   MRN: [id number]    DOB: [birth year]   Date:10/13/2016       Progress Note  Subjective  Chief Complaint  Chief Complaint  Patient presents with  . Annual Exam  . Atrial Fibrillation  . Hypertension    HPI Here for annual physical exam.  Has hx of HBP and elevated chol.  Has has borderline high thyroid and intermittant a. Fib., but he has not had an episode in >3 years.  He is feeling well and has no c/o. - No problem-specific Assessment & Plan notes found for this encounter.   Past Medical History:  Diagnosis Date  . Asthma   . CKD (chronic kidney disease), stage II   . Essential hypertension   . Hyperlipidemia   . PAF (paroxysmal atrial fibrillation) (HCC)    a. CHA2DS2VASc = 2;  b. takes amio/eliquis/metoprolol prn for PAF.    Past Surgical History:  Procedure Laterality Date  . FINGER SURGERY     left index finger  . HERNIA REPAIR    . KNEE SURGERY     left     Family History  Problem Relation Age of Onset  . Hyperlipidemia Mother   . Hypertension Mother   . Heart disease Mother   . Heart disease Sister     valve repair   . Hypertension Brother     Social History   Social History  . Marital status: Married    Spouse name: N/A  . Number of children: N/A  . Years of education: N/A   Occupational History  . Not on file.   Social History Main Topics  . Smoking status: Former Smoker    Packs/day: 1.00    Years: 20.00    Types: Cigarettes    Quit date: 02/01/1993  . Smokeless tobacco: Never Used  . Alcohol use 0.0 oz/week     Comment: social drinker.  . Drug use: No  . Sexual activity: Not on file   Other Topics Concern  . Not on file   Social History Narrative  . No narrative on file     Current Outpatient Prescriptions:  .  amiodarone (PACERONE) 200 MG tablet, Take 1 tablet (200 mg total) by mouth 2 (two) times daily as needed (for afib)., Disp: 30 tablet, Rfl: 2 .  apixaban (ELIQUIS) 5 MG TABS tablet, Take 1 tablet (5  mg total) by mouth 2 (two) times daily as needed (for afib)., Disp: 60 tablet, Rfl: 2 .  aspirin EC 81 MG tablet, Take 81 mg by mouth daily. , Disp: , Rfl:  .  lisinopril (PRINIVIL,ZESTRIL) 5 MG tablet, TAKE 1 TABLET (5 MG TOTAL) BY MOUTH DAILY., Disp: 30 tablet, Rfl: 6 .  metoprolol tartrate (LOPRESSOR) 25 MG tablet, Take 1 tablet (25 mg total) by mouth 2 (two) times daily as needed (for afib)., Disp: 60 tablet, Rfl: 2 .  rosuvastatin (CRESTOR) 20 MG tablet, Take 1 tablet (20 mg total) by mouth daily., Disp: 90 tablet, Rfl: 3 .  hydrocortisone (PROCTOSOL HC) 2.5 % rectal cream, Place 1 application rectally 2 (two) times daily., Disp: 30 g, Rfl: 0  Allergies  Allergen Reactions  . Penicillins     Rash Rash      Review of Systems  Constitutional: Negative for chills, fever, malaise/fatigue and weight loss.  HENT: Negative for hearing loss.   Eyes: Negative for blurred vision and double vision.  Respiratory: Negative for cough, shortness of breath and wheezing.   Cardiovascular: Negative  for chest pain, palpitations and leg swelling.  Gastrointestinal: Negative for abdominal pain, blood in stool and heartburn.  Genitourinary: Negative for dysuria, frequency and urgency.       Has a hydrocoele and c/o abnormal bend to penis.  Musculoskeletal: Negative for back pain and joint pain.  Skin: Negative for rash.  Neurological: Negative for dizziness, tremors, weakness and headaches.    -  Objective  Vitals:   10/13/16 0957  BP: 124/69  Pulse: 64  Resp: 16  Temp: 98.5 F (36.9 C)  TempSrc: Oral  Weight: 176 lb (79.8 kg)  Height: 6' (1.829 m)    Physical Exam  Constitutional: He is oriented to person, place, and time and well-developed, well-nourished, and in no distress. No distress.  HENT:  Head: Normocephalic and atraumatic.  Right Ear: External ear normal.  Left Ear: External ear normal.  Nose: Nose normal.  Mouth/Throat: Oropharynx is clear and moist.  Eyes:  Conjunctivae and EOM are normal. Pupils are equal, round, and reactive to light. No scleral icterus.  Neck: Normal range of motion. Neck supple. Carotid bruit is not present. No thyromegaly present.  Cardiovascular: Normal rate, regular rhythm and normal heart sounds.  Exam reveals no gallop and no friction rub.   No murmur heard. Pulmonary/Chest: Effort normal and breath sounds normal. No respiratory distress. He has no wheezes. He has no rales.  Abdominal: Soft. Bowel sounds are normal. He exhibits no distension and no mass. There is no tenderness.  Genitourinary: Penis normal. Prostate is enlarged. He exhibits abnormal scrotal mass. He exhibits no abnormal testicular mass and no testicular tenderness.  Genitourinary Comments: Patient with fairly large L hydrocoele  Patient with R side of prostate more enlarged than L without nodules.  Musculoskeletal: Normal range of motion. He exhibits no edema.  Lymphadenopathy:    He has no cervical adenopathy.  Neurological: He is alert and oriented to person, place, and time. No cranial nerve deficit. Gait normal.  Skin: Skin is warm and dry.  Vitals reviewed.      No results found for this or any previous visit (from the past 2160 hour(s)).   Assessment & Plan  Problem List Items Addressed This Visit      Cardiovascular and Mediastinum   PAF (paroxysmal atrial fibrillation) ([place])   Essential hypertension - Primary   Relevant Orders   COMPLETE METABOLIC PANEL WITH GFR     Genitourinary   Benign prostatic hyperplasia with urinary obstruction   Relevant Orders   PSA   Ambulatory referral to Urology   Renal insufficiency   Relevant Orders   CBC with Differential     Other   Hyperlipidemia   Relevant Orders   Lipid Profile   Abnormal TSH   Relevant Orders   TSH    Other Visit Diagnoses    Immunization due       Relevant Orders   Flu Vaccine QUAD 36+ mos PF IM (Fluarix & Fluzone Quad PF)   Grade I hemorrhoids       Relevant  Medications   hydrocortisone (PROCTOSOL HC) 2.5 % rectal cream      Meds ordered this encounter  Medications  . hydrocortisone (PROCTOSOL HC) 2.5 % rectal cream    Sig: Place 1 application rectally 2 (two) times daily.    Dispense:  30 g    Refill:  0   1. Essential hypertension Cont. Lisinopril - COMPLETE METABOLIC PANEL WITH GFR  2. PAF (paroxysmal atrial fibrillation) ([place])   3. Renal  insufficiency  - CBC with Differential  4. Benign prostatic hyperplasia with urinary obstruction  - PSA - Ambulatory referral to Urology  5. Hyperlipidemia, unspecified hyperlipidemia type Cont Crestor. - Lipid Profile  6. Abnormal TSH  - TSH  7. Immunization due  - Flu Vaccine QUAD 36+ mos PF IM (Fluarix & Fluzone Quad PF)  8. Grade I hemorrhoids  - hydrocortisone (PROCTOSOL HC) 2.5 % rectal cream; Place 1 application rectally 2 (two) times daily.  Dispense: 30 g; Refill: 0

## 2016-10-14 ENCOUNTER — Other Ambulatory Visit: Payer: Medicare Other

## 2016-10-14 DIAGNOSIS — N401 Enlarged prostate with lower urinary tract symptoms: Secondary | ICD-10-CM | POA: Diagnosis not present

## 2016-10-14 DIAGNOSIS — R946 Abnormal results of thyroid function studies: Secondary | ICD-10-CM | POA: Diagnosis not present

## 2016-10-14 DIAGNOSIS — N138 Other obstructive and reflux uropathy: Secondary | ICD-10-CM | POA: Diagnosis not present

## 2016-10-14 DIAGNOSIS — E785 Hyperlipidemia, unspecified: Secondary | ICD-10-CM | POA: Diagnosis not present

## 2016-10-14 DIAGNOSIS — I10 Essential (primary) hypertension: Secondary | ICD-10-CM | POA: Diagnosis not present

## 2016-10-14 DIAGNOSIS — N289 Disorder of kidney and ureter, unspecified: Secondary | ICD-10-CM | POA: Diagnosis not present

## 2016-10-14 LAB — CBC WITH DIFFERENTIAL/PLATELET
BASOS PCT: 1 %
Basophils Absolute: 42 cells/uL (ref 0–200)
EOS ABS: 84 {cells}/uL (ref 15–500)
Eosinophils Relative: 2 %
HCT: 40.5 % (ref 38.5–50.0)
Hemoglobin: 14.1 g/dL (ref 13.2–17.1)
LYMPHS PCT: 31 %
Lymphs Abs: 1302 cells/uL (ref 850–3900)
MCH: 30.9 pg (ref 27.0–33.0)
MCHC: 34.8 g/dL (ref 32.0–36.0)
MCV: 88.6 fL (ref 80.0–100.0)
MONOS PCT: 11 %
MPV: 9.9 fL (ref 7.5–12.5)
Monocytes Absolute: 462 cells/uL (ref 200–950)
Neutro Abs: 2310 cells/uL (ref 1500–7800)
Neutrophils Relative %: 55 %
PLATELETS: 125 10*3/uL — AB (ref 140–400)
RBC: 4.57 MIL/uL (ref 4.20–5.80)
RDW: 15 % (ref 11.0–15.0)
WBC: 4.2 10*3/uL (ref 3.8–10.8)

## 2016-10-14 LAB — COMPLETE METABOLIC PANEL WITH GFR
ALT: 27 U/L (ref 9–46)
AST: 22 U/L (ref 10–35)
Albumin: 3.4 g/dL — ABNORMAL LOW (ref 3.6–5.1)
Alkaline Phosphatase: 68 U/L (ref 40–115)
BUN: 17 mg/dL (ref 7–25)
CALCIUM: 9 mg/dL (ref 8.6–10.3)
CHLORIDE: 107 mmol/L (ref 98–110)
CO2: 28 mmol/L (ref 20–31)
Creat: 1.29 mg/dL — ABNORMAL HIGH (ref 0.70–1.25)
GFR, Est African American: 65 mL/min (ref >=60)
GFR, Est Non African American: 56 mL/min — ABNORMAL LOW (ref >=60)
GLUCOSE: 101 mg/dL — AB (ref 65–99)
POTASSIUM: 4.2 mmol/L (ref 3.5–5.3)
SODIUM: 140 mmol/L (ref 135–146)
Total Bilirubin: 0.8 mg/dL (ref 0.2–1.2)
Total Protein: 5.8 g/dL — ABNORMAL LOW (ref 6.1–8.1)

## 2016-10-14 LAB — LIPID PANEL
CHOL/HDL RATIO: 3.2 ratio (ref <=5.0)
CHOLESTEROL: 133 mg/dL (ref 125–200)
HDL: 42 mg/dL (ref >=40)
LDL Cholesterol: 73 mg/dL (ref <130)
Triglycerides: 92 mg/dL (ref <150)
VLDL: 18 mg/dL (ref <30)

## 2016-10-14 LAB — TSH: TSH: 0.37 mIU/L — ABNORMAL LOW (ref 0.40–4.50)

## 2016-10-15 LAB — PSA: PSA: 4 ng/mL (ref <=4.0)

## 2016-10-16 ENCOUNTER — Other Ambulatory Visit: Payer: Self-pay

## 2016-10-16 LAB — T4: T4, Total: 4.8 ug/dL (ref 4.5–12.0)

## 2016-10-17 LAB — T3: T3 TOTAL: 88 ng/dL (ref 76–181)

## 2016-10-19 NOTE — Addendum Note (Signed)
Addended by: Devona Konig on: 10/19/2016 01:47 PM   Modules accepted: Orders

## 2016-10-21 DIAGNOSIS — N138 Other obstructive and reflux uropathy: Secondary | ICD-10-CM | POA: Diagnosis not present

## 2016-10-21 DIAGNOSIS — N486 Induration penis plastica: Secondary | ICD-10-CM | POA: Diagnosis not present

## 2016-10-21 DIAGNOSIS — Z6825 Body mass index (BMI) 25.0-25.9, adult: Secondary | ICD-10-CM | POA: Diagnosis not present

## 2016-10-21 DIAGNOSIS — R972 Elevated prostate specific antigen [PSA]: Secondary | ICD-10-CM | POA: Diagnosis not present

## 2016-10-21 DIAGNOSIS — N401 Enlarged prostate with lower urinary tract symptoms: Secondary | ICD-10-CM | POA: Diagnosis not present

## 2016-10-21 DIAGNOSIS — N433 Hydrocele, unspecified: Secondary | ICD-10-CM | POA: Diagnosis not present

## 2016-10-21 DIAGNOSIS — N4340 Spermatocele of epididymis, unspecified: Secondary | ICD-10-CM | POA: Diagnosis not present

## 2016-10-21 DIAGNOSIS — R339 Retention of urine, unspecified: Secondary | ICD-10-CM | POA: Diagnosis not present

## 2016-10-23 DIAGNOSIS — R972 Elevated prostate specific antigen [PSA]: Secondary | ICD-10-CM | POA: Diagnosis not present

## 2016-10-24 DIAGNOSIS — R972 Elevated prostate specific antigen [PSA]: Secondary | ICD-10-CM | POA: Diagnosis not present

## 2016-11-23 ENCOUNTER — Encounter: Payer: Self-pay | Admitting: Family Medicine

## 2016-11-23 ENCOUNTER — Ambulatory Visit (INDEPENDENT_AMBULATORY_CARE_PROVIDER_SITE_OTHER): Payer: Medicare Other | Admitting: Family Medicine

## 2016-11-23 VITALS — BP 128/76 | HR 62 | Temp 97.9°F | Resp 16 | Ht 72.0 in | Wt 177.6 lb

## 2016-11-23 DIAGNOSIS — M545 Low back pain, unspecified: Secondary | ICD-10-CM

## 2016-11-23 NOTE — Progress Notes (Signed)
Subjective:    Patient ID: Andrew Benson, male    DOB: 18-Apr-1946, 70 y.o.   MRN: [id number]  Andrew Benson is a 70 y.o. male presenting on 11/23/2016 for Abdominal Pain (on L side onset 6 days pain occurs with ROM he picked a box and left in truck that's how pain started as per pt)  Patient presents for a same day appointment.  HPI   LOW BACK PAIN, Left sided without sciatica: - Prior history years ago with mild back pain, usually self limited flares only lasting 1-2 days, no significant known back injury or diagnosis - Reports symptoms started about 5 days ago with possible inciting injury lifting a heavy box. Today seems to be improved but still has intermittent sharp pains that are unexpected and cause him to be more cautious. Describes pain as initially more of a "deeper pain" not a spasm or tightening initial moderate severity, then improvement, now with only intermittent worsening sharp pains without radiation, only provoked by certain movements, twisting, rotation, or worse seated prolonged up to standing. No pain radiating to legs. - Not taking any analgesic OTC medications, does have prior old medication for back pain at home, does not recall, name will notify us of this one - Not tried heating pad, ice, muscle rub - No known history of lumbar OA/DJD, no prior back surgery - Additional PMH: CKD-II (stable Cr 1.3 over past 3 years, with dx HTN controlled), Elevated PSA awaiting further follow-up PSA per [facility] Dr Jacqlyn Larsen without known dx Prostate CA  - Denies any fevers/chills, numbness, tingling, weakness, loss of control bladder/bowel incontinence or retention, unintentional wt loss, night sweats, hematuria, dysuria, urinary frequency  Social History  Substance Use Topics  . Smoking status: Former Smoker    Packs/day: 1.00    Years: 20.00    Types: Cigarettes    Quit date: 02/01/1993  . Smokeless tobacco: Never Used  . Alcohol use 0.0 oz/week     Comment: social drinker.     Review of Systems Per HPI unless specifically indicated above     Objective:    BP 128/76   Pulse 62   Temp 97.9 F (36.6 C) (Oral)   Resp 16   Ht 6' (1.829 m)   Wt 177 lb 9.6 oz (80.6 kg)   BMI 24.09 kg/m   Wt Readings from Last 3 Encounters:  11/23/16 177 lb 9.6 oz (80.6 kg)  10/13/16 176 lb (79.8 kg)  04/30/16 186 lb (84.4 kg)    Physical Exam  Constitutional: He appears well-developed and well-nourished. No distress.  Well-appearing, comfortable, cooperative  HENT:  Head: Normocephalic and atraumatic.  Mouth/Throat: Oropharynx is clear and moist.  Cardiovascular: Normal rate and intact distal pulses.   Pulmonary/Chest: Effort normal.  Musculoskeletal: He exhibits no edema.  Low Back Inspection: Normal appearance, thin body habitus, no spinal deformity, symmetrical. Palpation: No tenderness over spinous processes. Bilateral lumbar paraspinal muscles non-tender and without significant hypertonicity/spasm. ROM: Full active ROM forward flex / back extension, rotation L/R without discomfort Special Testing: Seated SLR negative for radicular pain bilaterally (Left SLR with localized mild L-LBP reproduced). Standing facet load test negative. Strength: Bilateral hip flex/ext 5/5, knee flex/ext 5/5, ankle dorsiflex/plantarflex 5/5 Neurovascular: intact distal sensation to light touch  Neurological: He is alert.  +2 patellar and achilles DTR bilateral  Skin: Skin is warm and dry. No rash noted. He is not diaphoretic.  Psychiatric: His behavior is normal.  Nursing note and vitals reviewed.  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 Suspect likely due to muscle spasm/strain, with known minor lifting injury without trauma at time of onset 5 days ago. Unlikely other causes in LBP differential such as nephrolithiasis or UTI/pyelo  without urinary symptoms and history not consistent with colicky pain, no prior history, afebrile, no abdominal pain - Prior mild intermittent chronic LBP, infrequent, without significant diagnosis of OA/DJD, no prior surgery - No red flag symptoms. Negative SLR for radiculopathy - Inadequate conservative therapy  Plan: 1. Discussion on differential, most likely MSK etiology, concern given chronic elevated Cr 1.3 baseline over past 4 years, CKD-II will avoid NSAID initially, start with regular Tylenol dosing 1000mg  TID for 1 week then may continue, PRN, if needed can try OTC Aleve 250 BID wc for 1 week 2. Suspect will benefit from muscle relaxant Baclofen 5-10mg  TID PRN as tolerated, await rx as patient is going to check home med and notify us name / dosage 3. Activity modification and encouraged use of heating pad 1-2x daily for now then PRN 4. Follow-up 4-6 weeks as needed if not improved, consider X-ray imaging or further treatment           Follow up plan: Return in about 4 weeks (around 12/21/2016), or if symptoms worsen or fail to improve, for low back pain.  [name], [facility] 11/23/2016, 3:01 PM

## 2016-11-23 NOTE — Assessment & Plan Note (Signed)
Acute on chronic Left LBP without associated L sciatica. Suspect likely due to muscle spasm/strain, with known minor lifting injury without trauma at time of onset 5 days ago. Unlikely other causes in LBP differential such as nephrolithiasis or UTI/pyelo without urinary symptoms and history not consistent with colicky pain, no prior history, afebrile, no abdominal pain - Prior mild intermittent chronic LBP, infrequent, without significant diagnosis of OA/DJD, no prior surgery - No red flag symptoms. Negative SLR for radiculopathy - Inadequate conservative therapy  Plan: 1. Discussion on differential, most likely MSK etiology, concern given chronic elevated Cr 1.3 baseline over past 4 years, CKD-II will avoid NSAID initially, start with regular Tylenol dosing 1000mg  TID for 1 week then may continue, PRN, if needed can try OTC Aleve 250 BID wc for 1 week 2. Suspect will benefit from muscle relaxant Baclofen 5-10mg  TID PRN as tolerated, await rx as patient is going to check home med and notify us name / dosage 3. Activity modification and encouraged use of heating pad 1-2x daily for now then PRN 4. Follow-up 4-6 weeks as needed if not improved, consider X-ray imaging or further treatment

## 2016-11-23 NOTE — Patient Instructions (Signed)
Thank you for coming in to clinic today.  1. 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 I do not think it is related to kidneys, kidney stone, internal organ, sciatica nerve pain.  2. Recommend to start taking Tylenol Extra Strength 500mg  tabs - take 1 to 2 tabs per dose (max 1000mg ) every 6-8 hours for pain (take regularly, don't skip a dose for next 7 days), max 24 hour daily dose is 6 tablets or 3000mg . In the future you can repeat the same everyday Tylenol course for 1-2 weeks at a time.  - This is safe to take with anti-inflammatory medicines (ibuprofen, Advil, Aleve, Naproxen) - if you need a stronger anti-inflammatory, I would try OTC Aleve 250mg  twice daily (12 hrs apart, with food, breakfast and dinner) every day for up to 1 week only - Send me name and dose of your old back medications, and what you prefer etc. I'll let you know what we can do next. Otherwise, if you need a muscle relaxant, I prefer Baclofen 10mg  tablets - cut in half for 5mg  at night for muscle relaxant - may make you sedated or sleepy (be careful driving or working on this) if tolerated you can take every 8 hours, half or whole tab  3. Recommend to start using heating pad on your lower back 1-2x daily for few weeks  This pain may take weeks to months to fully resolve, but hopefully it will respond to the medicine initially. All back injuries (small or serious) are slow to heal since we use our back muscles every day. Be careful with turning, twisting, lifting, sitting / standing for prolonged periods, and avoid re-injury.  If your symptoms significantly worsen with more pain, or new symptoms with weakness in one or both legs, new or different shooting leg pains, numbness in legs or groin, loss of control or retention of urine or bowel movements, please call back for advice and you may need to go directly to the Emergency  Department.   Please schedule a follow-up appointment with Dr. Parks Ranger in 4 to 6 weeks as needed if not improved low back pain, consider X-rays.  If you have any other questions or concerns, please feel free to call the clinic or send a message through [place]. You may also schedule an earlier appointment if necessary.  [name], DO [place]

## 2016-11-24 MED ORDER — BACLOFEN 10 MG PO TABS: 5.0000 mg | ORAL_TABLET | Freq: Three times a day (TID) | ORAL | 2 refills | Status: DC | PRN

## 2016-12-02 DIAGNOSIS — R946 Abnormal results of thyroid function studies: Secondary | ICD-10-CM | POA: Diagnosis not present

## 2016-12-17 ENCOUNTER — Other Ambulatory Visit: Payer: Self-pay | Admitting: *Deleted

## 2016-12-17 MED ORDER — LISINOPRIL 5 MG PO TABS: ORAL_TABLET | ORAL | 6 refills | Status: DC

## 2016-12-22 DIAGNOSIS — R946 Abnormal results of thyroid function studies: Secondary | ICD-10-CM | POA: Diagnosis not present

## 2016-12-28 DIAGNOSIS — E038 Other specified hypothyroidism: Secondary | ICD-10-CM | POA: Diagnosis not present

## 2017-01-11 ENCOUNTER — Ambulatory Visit (INDEPENDENT_AMBULATORY_CARE_PROVIDER_SITE_OTHER): Payer: Medicare Other | Admitting: Cardiovascular Disease

## 2017-01-11 ENCOUNTER — Encounter: Payer: Self-pay | Admitting: Cardiovascular Disease

## 2017-01-11 VITALS — BP 120/68 | HR 69 | Ht 71.0 in | Wt 178.5 lb

## 2017-01-11 DIAGNOSIS — I480 Paroxysmal atrial fibrillation: Secondary | ICD-10-CM

## 2017-01-11 DIAGNOSIS — R0789 Other chest pain: Secondary | ICD-10-CM | POA: Diagnosis not present

## 2017-01-11 DIAGNOSIS — E782 Mixed hyperlipidemia: Secondary | ICD-10-CM | POA: Diagnosis not present

## 2017-01-11 DIAGNOSIS — I10 Essential (primary) hypertension: Secondary | ICD-10-CM | POA: Diagnosis not present

## 2017-01-11 DIAGNOSIS — N182 Chronic kidney disease, stage 2 (mild): Secondary | ICD-10-CM

## 2017-01-11 NOTE — Patient Instructions (Signed)

## 2017-01-11 NOTE — Progress Notes (Signed)
Cardiology Office Note  Date:  01/11/2017   ID:  Andrew Benson, Andrew Benson 1946/08/24, MRN [id number]  PCP:  Dicky Doe, MD   Chief Complaint  Patient presents with  . other    12 month. Meds reviewed by the pt. verbally. "doing well."     HPI:  Andrew Benson is a pleasant 71 year old gentleman with remote history of atrial fibrillation 25 years ago, remote history of smoking for 20 years, patient of Dr. Luan Pulling who presents for routine followup for atrial fibrillation.  Previously had atrial fibrillation while he was on his tractor in 2015, developed  acute onset of palpitations, presented to the emergency room  atrial fib 10/26/13 10/31/13 NSR  In follow-up today he reports that he is doing well Lab work reviewed with him, he is on Crestor 20 minute grams daily with no side effects Total chol 130, LDL 73  He reports having an episode of chest pain last week, no radiation to his arm or jaw  Works at [employer], thinks it was musculoskeletal, hurt when he pushed on his chest No further episodes with exertion  he denies any further episodes of arrhythmia. He's feeling well, active, no complaints. He periodically monitors his heart rhythm on his phone.  He reports having 2 previous episodes of atrial fibrillation, he has felt both episodes as he did not feel right, had palpitations  He does have eliquis 5 mg twice a day, metoprolol 25 mg twice a day, amiodarone 400 mg pills at home if needed  Currently not taking any medications and feels well  EKG on today's visit shows normal sinus rhythm with rate 69 bpm, rare PVC, nonspecific T-wave    PMH:   has a past medical history of Asthma; CKD (chronic kidney disease), stage II; Essential hypertension; Hyperlipidemia; and PAF (paroxysmal atrial fibrillation) ([place]).  PSH:    Past Surgical History:  Procedure Laterality Date  . FINGER SURGERY     left index finger  . HERNIA REPAIR    . KNEE SURGERY     left      Current Outpatient Prescriptions  Medication Sig Dispense Refill  . amiodarone (PACERONE) 200 MG tablet Take 1 tablet (200 mg total) by mouth 2 (two) times daily as needed (for afib). 30 tablet 2  . apixaban (ELIQUIS) 5 MG TABS tablet Take 1 tablet (5 mg total) by mouth 2 (two) times daily as needed (for afib). 60 tablet 2  . aspirin EC 81 MG tablet Take 81 mg by mouth daily.     . B Complex Vitamins (VITAMIN B COMPLEX PO) Take by mouth.    . baclofen (LIORESAL) 10 MG tablet Take 0.5-1 tablets (5-10 mg total) by mouth 3 (three) times daily as needed for muscle spasms. 30 each 2  . Co-Enzyme Q-10 30 MG CAPS Take 30 mg by mouth.    Marland Kitchen lisinopril (PRINIVIL,ZESTRIL) 5 MG tablet TAKE 1 TABLET (5 MG TOTAL) BY MOUTH DAILY. 30 tablet 6  . metoprolol tartrate (LOPRESSOR) 25 MG tablet Take 1 tablet (25 mg total) by mouth 2 (two) times daily as needed (for afib). 60 tablet 2  . rosuvastatin (CRESTOR) 20 MG tablet Take 1 tablet (20 mg total) by mouth daily. 90 tablet 3   No current facility-administered medications for this visit.      Allergies:   Penicillins   Social History:  The patient  reports that he quit smoking about 23 years ago. His smoking use included Cigarettes. He has a 20.00  pack-year smoking history. He has never used smokeless tobacco. He reports that he drinks alcohol. He reports that he does not use drugs.   Family History:   family history includes Heart disease in his mother and sister; Hyperlipidemia in his mother; Hypertension in his brother and mother.    Review of Systems: Review of Systems  Constitutional: Negative.   Respiratory: Negative.   Cardiovascular: Positive for chest pain.  Gastrointestinal: Negative.   Musculoskeletal: Negative.   Neurological: Negative.   Psychiatric/Behavioral: Negative.   All other systems reviewed and are negative.    PHYSICAL EXAM: VS:  BP 120/68 (BP Location: Left Arm, Patient Position: Sitting, Cuff Size: Normal)   Pulse 69    Ht 5\' 11"  (1.803 m)   Wt 178 lb 8 oz (81 kg)   BMI 24.90 kg/m  , BMI Body mass index is 24.9 kg/m. GEN: Well nourished, well developed, in no acute distress  HEENT: normal  Neck: no JVD, carotid bruits, or masses Cardiac: RRR; no murmurs, rubs, or gallops,no edema  Respiratory:  clear to auscultation bilaterally, normal work of breathing GI: soft, nontender, nondistended, + BS MS: no deformity or atrophy  Skin: warm and dry, no rash Neuro:  Strength and sensation are intact Psych: euthymic mood, full affect    Recent Labs: 10/13/2016: ALT 27; BUN 17; Creat 1.29; Hemoglobin 14.1; Platelets 125; Potassium 4.2; Sodium 140; TSH 0.37    Lipid Panel Lab Results  Component Value Date   CHOL 133 10/13/2016   HDL 42 10/13/2016   LDLCALC 73 10/13/2016   TRIG 92 10/13/2016      Wt Readings from Last 3 Encounters:  01/11/17 178 lb 8 oz (81 kg)  11/23/16 177 lb 9.6 oz (80.6 kg)  10/13/16 176 lb (79.8 kg)       ASSESSMENT AND PLAN:  Paroxysmal atrial fibrillation ([place]) - Plan: EKG 12-Lead Denies having any further episodes CHADS VASC of 1, reviewed with him in detail He prefers to stay on aspirin at this time Long discussion about his previous episodes of atrial fibrillation 2 None since 2014 as far as he is aware  Mixed hyperlipidemia Cholesterol is at goal on the current lipid regimen. No changes to the medications were made.  Essential hypertension - Plan: EKG 12-Lead Currently not on any medications for blood pressure  CKD (chronic kidney disease), stage II  Other chest pain Atypical type left-sided chest pain associated with lifting heavy boxes at church No further workup at this time   Total encounter time more than 25 minutes  Greater than 50% was spent in counseling and coordination of care with the patient   Disposition:   F/U  12 months   Orders Placed This Encounter  Procedures  . EKG 12-Lead     Signed, Esmond Plants, M.D., Ph.D. 01/11/2017   [place], [place]

## 2017-01-20 DIAGNOSIS — E038 Other specified hypothyroidism: Secondary | ICD-10-CM | POA: Diagnosis not present

## 2017-02-11 ENCOUNTER — Telehealth: Payer: Self-pay | Admitting: Cardiovascular Disease

## 2017-02-11 NOTE — Telephone Encounter (Signed)
Received records request EIS, forwarded to [facility] for processing. ° °

## 2017-02-21 IMAGING — US THYROID ULTRASOUND
1 series · 14 of 25 positions shown · non-contrast
Comparison: None.

CLINICAL DATA: Hyperthyroidism by blood tests 1 month ago

EXAM:
THYROID ULTRASOUND
TECHNIQUE: Ultrasound examination of the thyroid gland and adjacent soft
tissues was performed.

[Series 1: thyroid ultrasound · 0.06mm/px · 14 of 51 slices shown]
[im 1/51]
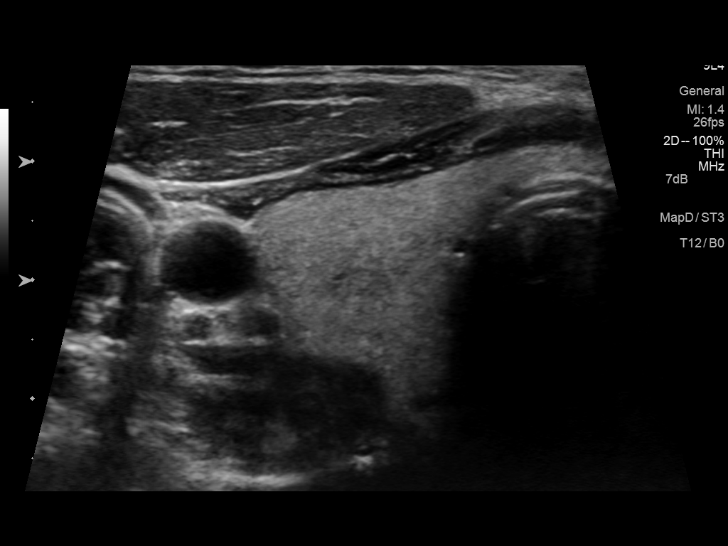
[im 5/51]
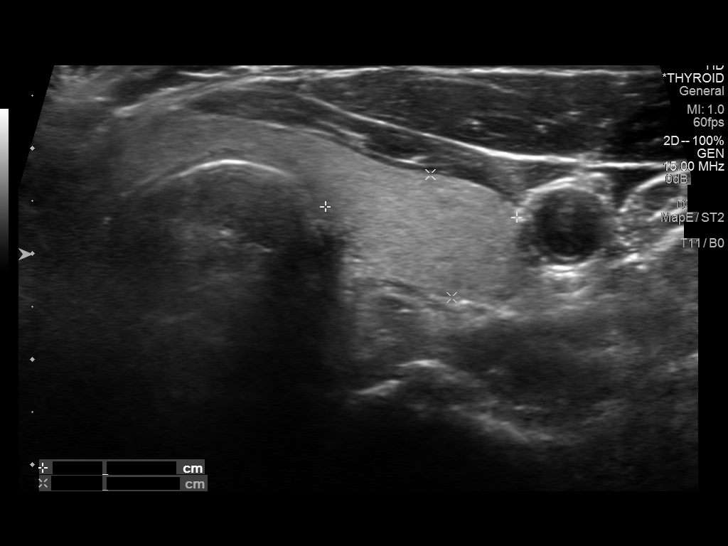
[im 9/51]
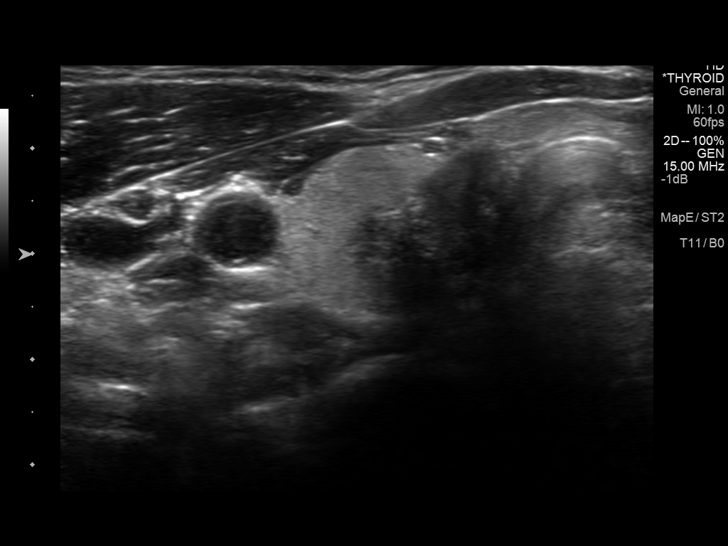
[im 13/51]
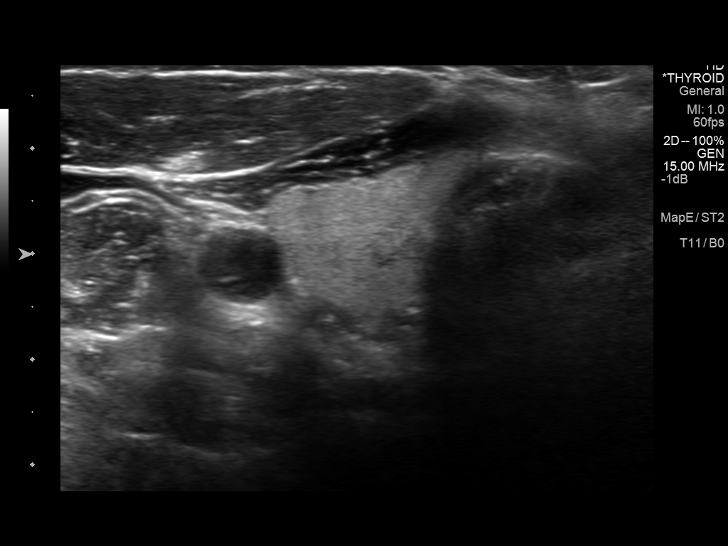
[im 17/51]
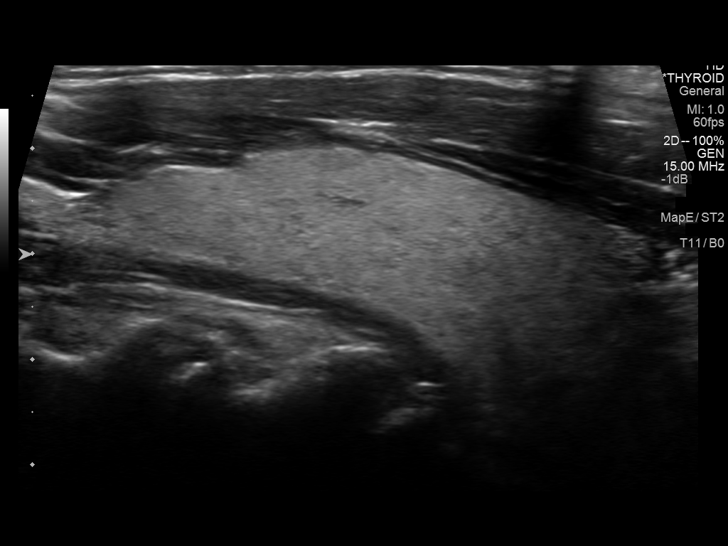
[im 19/51]
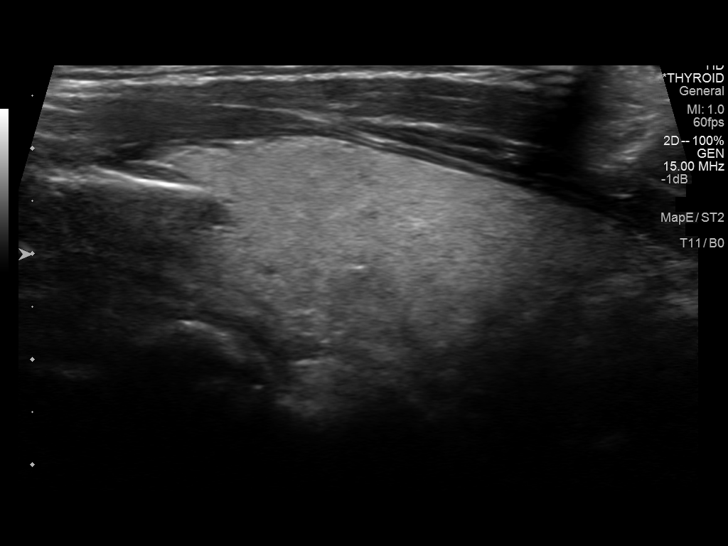
[im 23/51]
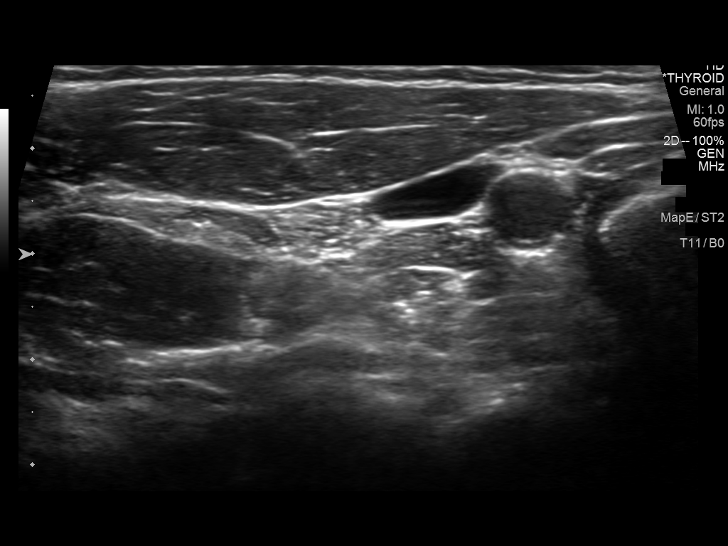
[im 28/51]
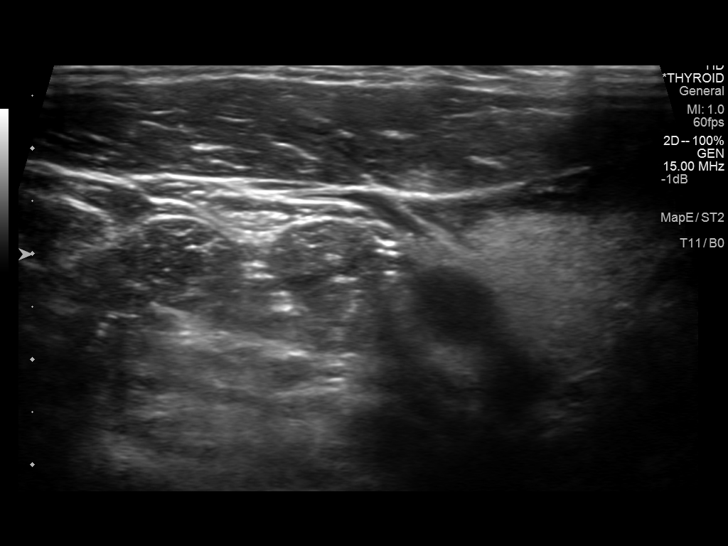
[im 32/51]
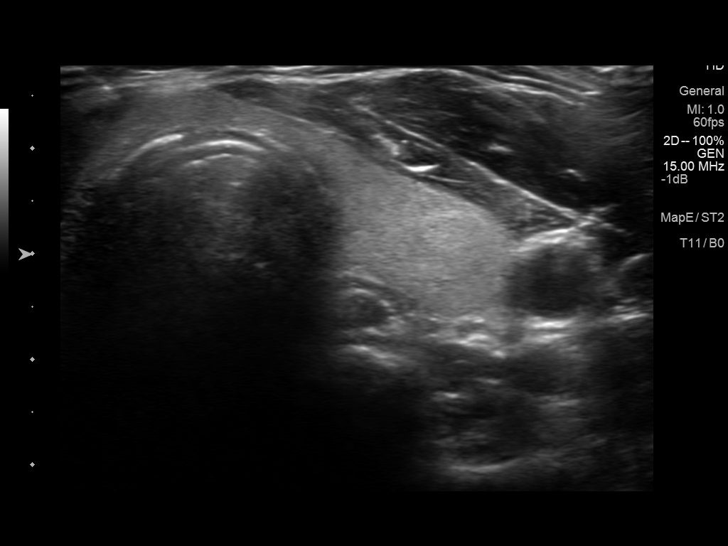
[im 34/51]
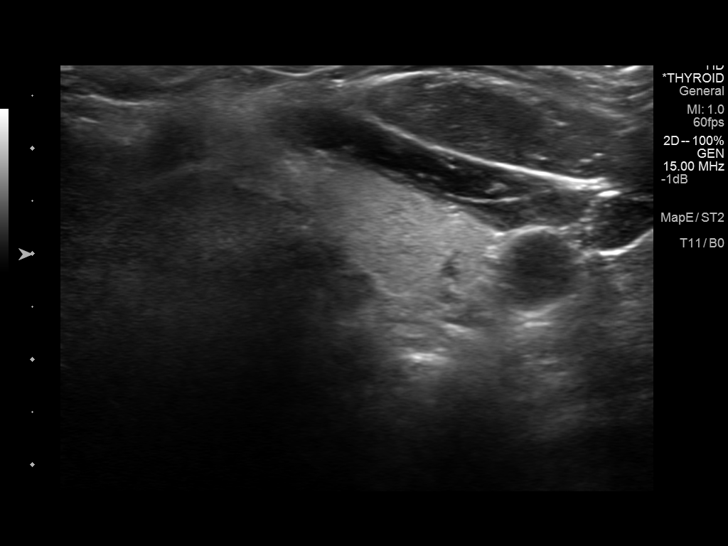
[im 38/51]
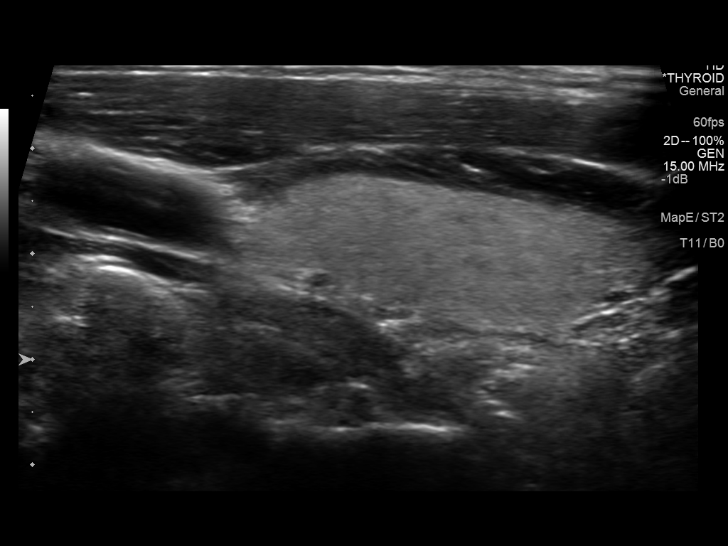
[im 42/51]
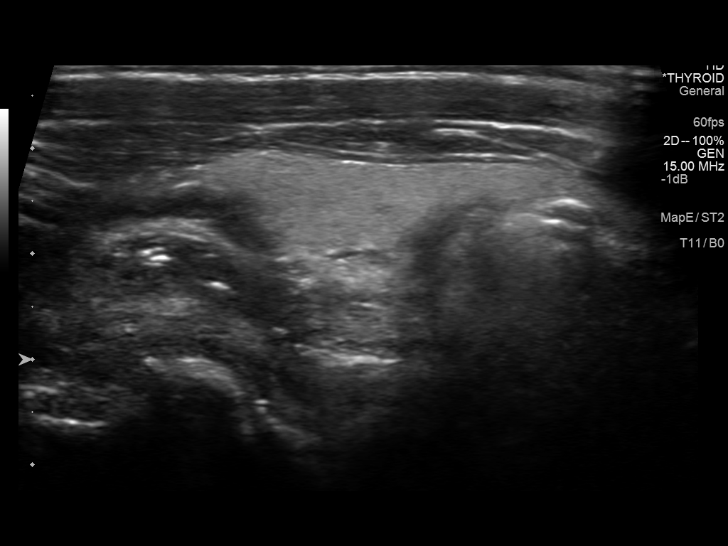
[im 46/51]
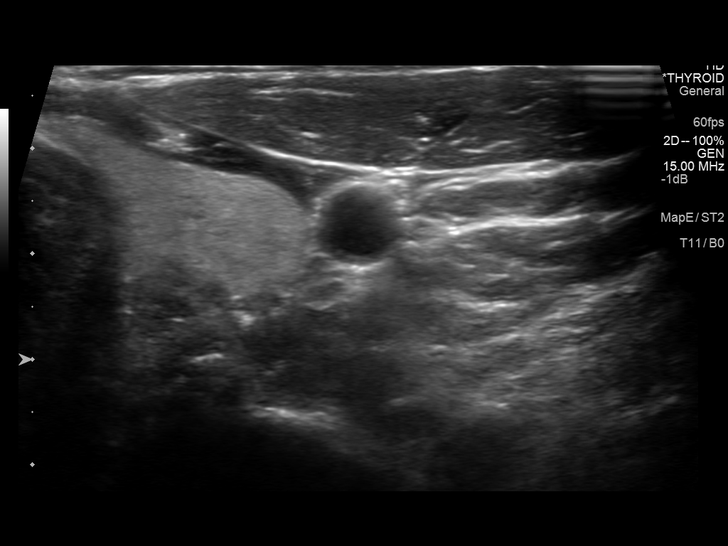
[im 51/51]
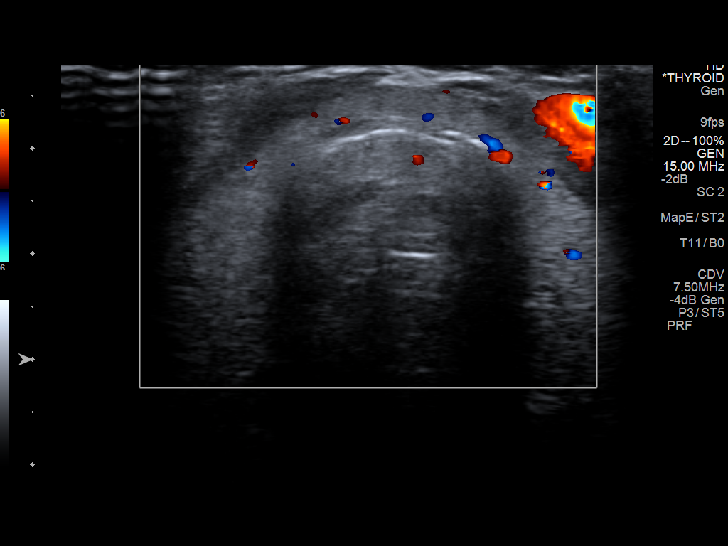

[14 of 25 positions shown; findings below may reference images not displayed]

FINDINGS: Right thyroid lobe

Measurements: 49 x 19 x 19 mm.  No nodules visualized.

Left thyroid lobe

Measurements: 52 x 12 x 18 mm.  No nodules visualized.

Isthmus

Thickness: 3 mm.  No nodules visualized.

Lymphadenopathy

None visualized.
IMPRESSION: 1. Normal thyroid.  No nodule or other focal lesion.

## 2017-02-24 DIAGNOSIS — N4340 Spermatocele of epididymis, unspecified: Secondary | ICD-10-CM | POA: Diagnosis not present

## 2017-02-24 DIAGNOSIS — R972 Elevated prostate specific antigen [PSA]: Secondary | ICD-10-CM | POA: Diagnosis not present

## 2017-02-24 DIAGNOSIS — Z6825 Body mass index (BMI) 25.0-25.9, adult: Secondary | ICD-10-CM | POA: Diagnosis not present

## 2017-02-24 DIAGNOSIS — N486 Induration penis plastica: Secondary | ICD-10-CM | POA: Diagnosis not present

## 2017-02-24 DIAGNOSIS — N433 Hydrocele, unspecified: Secondary | ICD-10-CM | POA: Diagnosis not present

## 2017-02-24 DIAGNOSIS — N401 Enlarged prostate with lower urinary tract symptoms: Secondary | ICD-10-CM | POA: Diagnosis not present

## 2017-02-24 DIAGNOSIS — N138 Other obstructive and reflux uropathy: Secondary | ICD-10-CM | POA: Diagnosis not present

## 2017-03-02 IMAGING — NM NM THYROID IMAGING W/ UPTAKE SINGLE (24 HR)
1 series · 3 of 3 positions shown · non-contrast
Comparison: Thyroid ultrasound 02/25/2015

CLINICAL DATA: Hyperthyroidism

EXAM:
THYROID SCAN AND UPTAKE - 4 HOURS and 24 HOURS
TECHNIQUE: Following the per oral administration of 2-JDK sodium iodide, the
patient returned at 24 hours and uptake measurements were acquired
with the uptake probe centered on the neck. Thyroid imaging was
performed following the intravenous administration of the 1c-AAm
Pertechnetate.
RADIOPHARMACEUTICALS:  147.134 microCuries 2-JDK sodium Iodide p.o.

[Series 1000: (id) thyroid scan · 2.40mm/px · 3 of 3 slices shown]
[im 1/3  full-range]
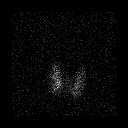
[im 2/3  full-range]
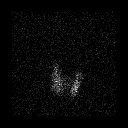
[im 3/3  full-range]
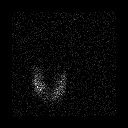

[3 of 3 positions shown; findings below may reference images not displayed]

FINDINGS: 5 hour radio iodine uptake calculated at 4.9%, borderline low.

23 hour radio iodine uptake calculated at 8.6%, normal.

Images of the thyroid gland in 3 projections show homogeneous tracer
distribution in both lobes.

No focal areas of increased or decreased tracer localization seen.
IMPRESSION: Normal thyroid scan.

Normal thyroid uptake as above.

## 2017-03-09 ENCOUNTER — Encounter: Payer: Self-pay | Admitting: Family Medicine

## 2017-03-09 ENCOUNTER — Ambulatory Visit (INDEPENDENT_AMBULATORY_CARE_PROVIDER_SITE_OTHER): Payer: Medicare Other | Admitting: Family Medicine

## 2017-03-09 ENCOUNTER — Encounter: Payer: Self-pay | Admitting: *Deleted

## 2017-03-09 ENCOUNTER — Other Ambulatory Visit: Payer: Self-pay | Admitting: *Deleted

## 2017-03-09 VITALS — BP 105/60 | HR 96 | Temp 98.3°F | Resp 16 | Ht 72.0 in | Wt 177.0 lb

## 2017-03-09 DIAGNOSIS — I480 Paroxysmal atrial fibrillation: Secondary | ICD-10-CM

## 2017-03-09 DIAGNOSIS — R946 Abnormal results of thyroid function studies: Secondary | ICD-10-CM

## 2017-03-09 DIAGNOSIS — E782 Mixed hyperlipidemia: Secondary | ICD-10-CM | POA: Diagnosis not present

## 2017-03-09 DIAGNOSIS — R972 Elevated prostate specific antigen [PSA]: Secondary | ICD-10-CM

## 2017-03-09 DIAGNOSIS — N182 Chronic kidney disease, stage 2 (mild): Secondary | ICD-10-CM | POA: Diagnosis not present

## 2017-03-09 DIAGNOSIS — E785 Hyperlipidemia, unspecified: Secondary | ICD-10-CM

## 2017-03-09 DIAGNOSIS — R7989 Other specified abnormal findings of blood chemistry: Secondary | ICD-10-CM

## 2017-03-09 DIAGNOSIS — I10 Essential (primary) hypertension: Secondary | ICD-10-CM | POA: Diagnosis not present

## 2017-03-09 MED ORDER — ROSUVASTATIN CALCIUM 20 MG PO TABS: 20.0000 mg | ORAL_TABLET | Freq: Every day | ORAL | 3 refills | Status: DC

## 2017-03-09 MED ORDER — LISINOPRIL 2.5 MG PO TABS: ORAL_TABLET | ORAL | 3 refills | Status: DC

## 2017-03-09 NOTE — Progress Notes (Signed)
Name: Andrew Benson   MRN: [id number]    DOB: 21-May-1946   Date:03/09/2017       Progress Note  Subjective  Chief Complaint  Chief Complaint  Patient presents with  . Hypertension    HPI Here for f/u of HBP.  Hx of a fib, but none recently.  He sees Dr. Rockey Situ for that.  Sees Dr. Jacqlyn Larsen for his elevated PSA and Dr. Gabriel Carina for abnormal thyroid.  He feels well and takes all meds.  No problem-specific Assessment & Plan notes found for this encounter.   Past Medical History:  Diagnosis Date  . Asthma   . CKD (chronic kidney disease), stage II   . Essential hypertension   . Hyperlipidemia   . PAF (paroxysmal atrial fibrillation) (HCC)    a. CHA2DS2VASc = 2;  b. takes amio/eliquis/metoprolol prn for PAF.    Past Surgical History:  Procedure Laterality Date  . FINGER SURGERY     left index finger  . HERNIA REPAIR    . KNEE SURGERY     left     Family History  Problem Relation Age of Onset  . Hyperlipidemia Mother   . Hypertension Mother   . Heart disease Mother   . Heart disease Sister     valve repair   . Hypertension Brother     Social History   Social History  . Marital status: Married    Spouse name: N/A  . Number of children: N/A  . Years of education: N/A   Occupational History  . Not on file.   Social History Main Topics  . Smoking status: Former Smoker    Packs/day: 1.00    Years: 20.00    Types: Cigarettes    Quit date: 02/01/1993  . Smokeless tobacco: Never Used  . Alcohol use 0.0 oz/week     Comment: social drinker.  . Drug use: No  . Sexual activity: Not on file   Other Topics Concern  . Not on file   Social History Narrative  . No narrative on file     Current Outpatient Prescriptions:  .  amiodarone (PACERONE) 200 MG tablet, Take 1 tablet (200 mg total) by mouth 2 (two) times daily as needed (for afib)., Disp: 30 tablet, Rfl: 2 .  apixaban (ELIQUIS) 5 MG TABS tablet, Take 1 tablet (5 mg total) by mouth 2 (two) times daily as needed (for  afib)., Disp: 60 tablet, Rfl: 2 .  aspirin EC 81 MG tablet, Take 81 mg by mouth daily. , Disp: , Rfl:  .  B Complex Vitamins (VITAMIN B COMPLEX PO), Take by mouth., Disp: , Rfl:  .  baclofen (LIORESAL) 10 MG tablet, Take 0.5-1 tablets (5-10 mg total) by mouth 3 (three) times daily as needed for muscle spasms., Disp: 30 each, Rfl: 2 .  Co-Enzyme Q-10 30 MG CAPS, Take 30 mg by mouth., Disp: , Rfl:  .  levothyroxine (SYNTHROID, LEVOTHROID) 50 MCG tablet, Take 50 mcg by mouth daily., Disp: , Rfl:  .  lisinopril (PRINIVIL,ZESTRIL) 2.5 MG tablet, TAKE 1 TABLET (5 MG TOTAL) BY MOUTH DAILY., Disp: 90 tablet, Rfl: 3 .  metoprolol tartrate (LOPRESSOR) 25 MG tablet, Take 1 tablet (25 mg total) by mouth 2 (two) times daily as needed (for afib)., Disp: 60 tablet, Rfl: 2 .  rosuvastatin (CRESTOR) 20 MG tablet, Take 1 tablet (20 mg total) by mouth daily., Disp: 90 tablet, Rfl: 3  Allergies  Allergen Reactions  . Penicillins     Rash  Rash      Review of Systems  Constitutional: Negative for chills, fever, malaise/fatigue and weight loss.  HENT: Negative for hearing loss and tinnitus.   Eyes: Negative for blurred vision and double vision.  Respiratory: Negative for cough, shortness of breath and wheezing.   Cardiovascular: Negative for chest pain, palpitations and leg swelling.  Gastrointestinal: Negative for abdominal pain, blood in stool, heartburn and nausea.  Genitourinary: Negative for dysuria, frequency and urgency.  Musculoskeletal: Negative for back pain and myalgias.  Skin: Negative for rash.  Neurological: Negative for dizziness, tingling, tremors, weakness and headaches.      Objective  Vitals:   03/09/17 0849 03/09/17 0917  BP: 111/71 105/60  Pulse: 96   Resp: 16   Temp: 98.3 F (36.8 C)   TempSrc: Oral   Weight: 177 lb (80.3 kg)   Height: 6' (1.829 m)     Physical Exam  Constitutional: He is oriented to person, place, and time and well-developed, well-nourished, and in no  distress. No distress.  HENT:  Head: Normocephalic and atraumatic.  Eyes: Conjunctivae and EOM are normal. Pupils are equal, round, and reactive to light. No scleral icterus.  Neck: Normal range of motion. Neck supple. No thyromegaly present.  Cardiovascular: Normal rate, regular rhythm and normal heart sounds.  Exam reveals no gallop and no friction rub.   No murmur heard. Pulmonary/Chest: Effort normal and breath sounds normal. No respiratory distress. He has no wheezes. He has no rales.  Musculoskeletal: He exhibits no edema.  Lymphadenopathy:    He has no cervical adenopathy.  Neurological: He is alert and oriented to person, place, and time.  Vitals reviewed.      No results found for this or any previous visit (from the past 2160 hour(s)).   Assessment & Plan  Problem List Items Addressed This Visit      Cardiovascular and Mediastinum   PAF (paroxysmal atrial fibrillation) (HCC)   Relevant Medications   lisinopril (PRINIVIL,ZESTRIL) 2.5 MG tablet   rosuvastatin (CRESTOR) 20 MG tablet   Essential hypertension - Primary   Relevant Medications   lisinopril (PRINIVIL,ZESTRIL) 2.5 MG tablet   rosuvastatin (CRESTOR) 20 MG tablet     Genitourinary   CKD (chronic kidney disease), stage II     Other   Hyperlipidemia   Relevant Medications   lisinopril (PRINIVIL,ZESTRIL) 2.5 MG tablet   rosuvastatin (CRESTOR) 20 MG tablet   Abnormal TSH   Elevated PSA      Meds ordered this encounter  Medications  . lisinopril (PRINIVIL,ZESTRIL) 2.5 MG tablet    Sig: TAKE 1 TABLET (5 MG TOTAL) BY MOUTH DAILY.    Dispense:  90 tablet    Refill:  3  . rosuvastatin (CRESTOR) 20 MG tablet    Sig: Take 1 tablet (20 mg total) by mouth daily.    Dispense:  90 tablet    Refill:  3   1. Essential hypertension  - lisinopril (PRINIVIL,ZESTRIL) 2.5 MG tablet; TAKE 1 TABLET (5 MG TOTAL) BY MOUTH DAILY.  Dispense: 90 tablet; Refill: 3  2. PAF (paroxysmal atrial fibrillation) (HCC) Cont  meds See Dr. Rockey Situ  3. Mixed hyperlipidemia cont med  4. Elevated PSA  Cont to see Dr. Jacqlyn Larsen 5. CKD (chronic kidney disease), stage II   6. Abnormal TSH Cont to see Dr. Gabriel Carina  7. Hyperlipidemia, unspecified hyperlipidemia type  - rosuvastatin (CRESTOR) 20 MG tablet; Take 1 tablet (20 mg total) by mouth daily.  Dispense: 90 tablet; Refill: 3

## 2017-03-19 DIAGNOSIS — R972 Elevated prostate specific antigen [PSA]: Secondary | ICD-10-CM | POA: Diagnosis not present

## 2017-03-20 DIAGNOSIS — R972 Elevated prostate specific antigen [PSA]: Secondary | ICD-10-CM | POA: Diagnosis not present

## 2017-04-19 DIAGNOSIS — E038 Other specified hypothyroidism: Secondary | ICD-10-CM | POA: Diagnosis not present

## 2017-04-28 DIAGNOSIS — E038 Other specified hypothyroidism: Secondary | ICD-10-CM | POA: Diagnosis not present

## 2017-07-20 DIAGNOSIS — B356 Tinea cruris: Secondary | ICD-10-CM | POA: Diagnosis not present

## 2017-07-27 ENCOUNTER — Telehealth: Payer: Self-pay | Admitting: Family Medicine

## 2017-07-27 NOTE — Telephone Encounter (Signed)
Pt asked if it was time for him to have colonoscopy.  His call back number is [phone]

## 2017-07-28 NOTE — Telephone Encounter (Signed)
Left message for patient to call back  

## 2017-07-29 NOTE — Telephone Encounter (Signed)
Attempted to contact the pt back, no answer. LMOM to return my call.  

## 2017-07-29 NOTE — Telephone Encounter (Signed)
The pt called requesting colonoscopy results. I informed him that we actually do not have his results, but it does say it was done 2013. I informed him that he need to contact Dr. Guss Bunde office to find out if he needs to repeat it in 5 yrs or 29yrs.

## 2017-08-17 ENCOUNTER — Encounter: Payer: Self-pay | Admitting: Family Medicine

## 2017-08-17 DIAGNOSIS — B356 Tinea cruris: Secondary | ICD-10-CM | POA: Diagnosis not present

## 2017-09-30 DIAGNOSIS — Z800 Family history of malignant neoplasm of digestive organs: Secondary | ICD-10-CM | POA: Diagnosis not present

## 2017-09-30 DIAGNOSIS — Z86010 Personal history of colonic polyps: Secondary | ICD-10-CM | POA: Diagnosis not present

## 2017-10-01 ENCOUNTER — Telehealth: Payer: Self-pay | Admitting: Cardiovascular Disease

## 2017-10-01 NOTE — Telephone Encounter (Signed)
Received request for cardiac clearance for patients upcoming colonoscopy 12/29/17 at [facility] with [facility]. Patient is on Eliquis and they are requesting to hold that for 3 days. Fax number [phone]

## 2017-10-03 NOTE — Telephone Encounter (Signed)
Need update on his status, was last seen 10 months ago. Rhythm, chest pain, SOB? thx

## 2017-10-04 ENCOUNTER — Ambulatory Visit (INDEPENDENT_AMBULATORY_CARE_PROVIDER_SITE_OTHER): Payer: Medicare Other

## 2017-10-04 DIAGNOSIS — Z23 Encounter for immunization: Secondary | ICD-10-CM

## 2017-10-05 NOTE — Telephone Encounter (Signed)
Patient denies any chest pain or shortness of breath but he does have some weird beats first thing in the AM. Patient would like to come in for follow up to discuss this prior to his planned procedure. Will route to scheduling to see if they can get him in to be seen for next available. Made patient aware that it may be November or December but that I would have them call to arrange. He was appreciative for the call and had no further questions at this time.

## 2017-11-05 DIAGNOSIS — N433 Hydrocele, unspecified: Secondary | ICD-10-CM | POA: Diagnosis not present

## 2017-11-05 DIAGNOSIS — Z6824 Body mass index (BMI) 24.0-24.9, adult: Secondary | ICD-10-CM | POA: Diagnosis not present

## 2017-11-05 DIAGNOSIS — N138 Other obstructive and reflux uropathy: Secondary | ICD-10-CM | POA: Diagnosis not present

## 2017-11-05 DIAGNOSIS — N486 Induration penis plastica: Secondary | ICD-10-CM | POA: Diagnosis not present

## 2017-11-05 DIAGNOSIS — R972 Elevated prostate specific antigen [PSA]: Secondary | ICD-10-CM | POA: Diagnosis not present

## 2017-11-05 DIAGNOSIS — N401 Enlarged prostate with lower urinary tract symptoms: Secondary | ICD-10-CM | POA: Diagnosis not present

## 2017-11-05 DIAGNOSIS — N529 Male erectile dysfunction, unspecified: Secondary | ICD-10-CM | POA: Diagnosis not present

## 2017-11-13 DIAGNOSIS — Z87891 Personal history of nicotine dependence: Secondary | ICD-10-CM | POA: Insufficient documentation

## 2017-11-13 NOTE — Progress Notes (Signed)
Cardiology Office Note  Date:  11/15/2017   ID:  [name], Alferd Apa [birth year], MRN [id number]  PCP:  Arlis Porta., MD   Chief Complaint  Patient presents with  . other    39mo f/u. Pt c/o irregular heartbeat-states app on his phone shows this; denies sob, cp. Reviewed meds with pt verbally.    HPI:  Mr. Arocho is a pleasant 71 year old gentleman with remote history of  atrial fibrillation 25 years ago,  remote history of smoking for 20 years,  Previously had atrial fibrillation while he was on his tractor in 2015, developed acute onset of palpitations, presented to the emergency room  atrial fib 10/26/13 10/31/13 NSR who presents for routine followup for atrial fibrillation.   In follow-up today he reports that he is doing well He is having some paroxysmal episodes of tachycardia, palpitations We have reviewed his phone CV strips that he measures using pulse meter Episodes on there with irregular rhythm concerning for atrial fibrillation Reports also having episodes of palpitations  Reviewed the chads VASC with him, score of 2  Denies any chest pain with exertion Works at [employer]  He does have metoprolol and amiodarone which he takes as needed for persistent palpitations Previously taking Eliquis as needed   on Crestor 20 mgrams daily w Total chol 130, LDL 73  EKG personally reviewed by myself on today's visit shows normal sinus rhythm with rate 69 bpm, nonspecific T-wave Previous EKG with PVCs   PMH:   has a past medical history of Asthma, CKD (chronic kidney disease), stage II, Essential hypertension, Hyperlipidemia, and PAF (paroxysmal atrial fibrillation) ([place]).  PSH:    Past Surgical History:  Procedure Laterality Date  . FINGER SURGERY     left index finger  . HERNIA REPAIR    . KNEE SURGERY     left     Current Outpatient Medications  Medication Sig Dispense Refill  . amiodarone (PACERONE) 200 MG tablet Take 1 tablet (200 mg total) by mouth 2  (two) times daily as needed (for afib). 30 tablet 2  . apixaban (ELIQUIS) 5 MG TABS tablet Take 1 tablet (5 mg total) by mouth 2 (two) times daily. 60 tablet 11  . aspirin EC 81 MG tablet Take 81 mg by mouth daily.     Marland Kitchen Co-Enzyme Q-10 30 MG CAPS Take 30 mg by mouth.    Marland Kitchen lisinopril (PRINIVIL,ZESTRIL) 2.5 MG tablet TAKE 1 TABLET (5 MG TOTAL) BY MOUTH DAILY. 90 tablet 3  . metoprolol tartrate (LOPRESSOR) 25 MG tablet Take 1 tablet (25 mg total) by mouth 2 (two) times daily as needed (for afib). 60 tablet 2  . Multiple Vitamin (MULTIVITAMIN) capsule Take 1 capsule by mouth daily.    . rosuvastatin (CRESTOR) 20 MG tablet Take 1 tablet (20 mg total) by mouth daily. 90 tablet 3   No current facility-administered medications for this visit.      Allergies:   Penicillins   Social History:  The patient  reports that he quit smoking about 24 years ago. His smoking use included cigarettes. He has a 20.00 pack-year smoking history. he has never used smokeless tobacco. He reports that he drinks alcohol. He reports that he does not use drugs.   Family History:   family history includes Heart disease in his mother and sister; Hyperlipidemia in his mother; Hypertension in his brother and mother.    Review of Systems: Review of Systems  Constitutional: Negative.   Respiratory: Negative.   Cardiovascular:  Positive for palpitations.  Gastrointestinal: Negative.   Musculoskeletal: Negative.   Neurological: Negative.   Psychiatric/Behavioral: Negative.   All other systems reviewed and are negative.    PHYSICAL EXAM: VS:  BP 112/78 (BP Location: Left Arm, Patient Position: Sitting, Cuff Size: Normal)   Pulse 69   Ht 5\' 11"  (1.803 m)   Wt 188 lb 12 oz (85.6 kg)   BMI 26.33 kg/m  , BMI Body mass index is 26.33 kg/m. GEN: Well nourished, well developed, in no acute distress  HEENT: normal  Neck: no JVD, carotid bruits, or masses Cardiac: RRR; no murmurs, rubs, or gallops,no edema  Respiratory:   clear to auscultation bilaterally, normal work of breathing GI: soft, nontender, nondistended, + BS MS: no deformity or atrophy  Skin: warm and dry, no rash Neuro:  Strength and sensation are intact Psych: euthymic mood, full affect    Recent Labs: No results found for requested labs within last 8760 hours.    Lipid Panel Lab Results  Component Value Date   CHOL 133 10/13/2016   HDL 42 10/13/2016   LDLCALC 73 10/13/2016   TRIG 92 10/13/2016      Wt Readings from Last 3 Encounters:  11/15/17 188 lb 12 oz (85.6 kg)  03/09/17 177 lb (80.3 kg)  01/11/17 178 lb 8 oz (81 kg)       ASSESSMENT AND PLAN:  Paroxysmal atrial fibrillation (HCC) - Plan: EKG 12-Lead CHADS VASC of 2,  Long discussion about his atrial fibrillation  He is having tachypalpitations Recommend he start Eliquis 5 mg twice daily  Mixed hyperlipidemia Cholesterol is at goal on the current lipid regimen. No changes to the medications were made. Stable  Essential hypertension - Plan: EKG 12-Lead Currently not on any medications for blood pressure We would use low-dose metoprolol for worsening atrial fibrillation  CKD (chronic kidney disease), stage II Stable  Other chest pain Previously with atypical pain, no recent symptoms   Total encounter time more than 25 minutes  Greater than 50% was spent in counseling and coordination of care with the patient   Disposition:   F/U  12 months   Orders Placed This Encounter  Procedures  . EKG 12-Lead     Signed, Esmond Plants, M.D., Ph.D. 11/15/2017  [place], [place]

## 2017-11-15 ENCOUNTER — Encounter: Payer: Self-pay | Admitting: Cardiovascular Disease

## 2017-11-15 ENCOUNTER — Ambulatory Visit (INDEPENDENT_AMBULATORY_CARE_PROVIDER_SITE_OTHER): Payer: Medicare Other | Admitting: Cardiovascular Disease

## 2017-11-15 VITALS — BP 112/78 | HR 69 | Ht 71.0 in | Wt 188.8 lb

## 2017-11-15 DIAGNOSIS — E782 Mixed hyperlipidemia: Secondary | ICD-10-CM | POA: Diagnosis not present

## 2017-11-15 DIAGNOSIS — Z87891 Personal history of nicotine dependence: Secondary | ICD-10-CM

## 2017-11-15 DIAGNOSIS — I10 Essential (primary) hypertension: Secondary | ICD-10-CM | POA: Diagnosis not present

## 2017-11-15 DIAGNOSIS — I480 Paroxysmal atrial fibrillation: Secondary | ICD-10-CM | POA: Diagnosis not present

## 2017-11-15 MED ORDER — APIXABAN 5 MG PO TABS: 5.0000 mg | ORAL_TABLET | Freq: Two times a day (BID) | ORAL | 11 refills | Status: DC

## 2017-11-15 NOTE — Patient Instructions (Addendum)
Medication Instructions:   Please start eliquis 5 mg twice a day  Labwork:  No new labs needed  Testing/Procedures:  No further testing at this time   Follow-Up: It was a pleasure seeing you in the office today. Please call us if you have new issues that need to be addressed before your next appt.  [phone]  Your physician wants you to follow-up in: 12 months.  You will receive a reminder letter in the mail two months in advance. If you don't receive a letter, please call our office to schedule the follow-up appointment.  If you need a refill on your cardiac medications before your next appointment, please call your pharmacy.

## 2017-11-23 ENCOUNTER — Encounter: Payer: Self-pay | Admitting: Cardiovascular Disease

## 2017-12-20 ENCOUNTER — Encounter: Payer: Self-pay | Admitting: Family Medicine

## 2017-12-20 ENCOUNTER — Ambulatory Visit (INDEPENDENT_AMBULATORY_CARE_PROVIDER_SITE_OTHER): Payer: Medicare Other | Admitting: Family Medicine

## 2017-12-20 VITALS — BP 130/80 | HR 78 | Temp 98.8°F | Resp 16 | Ht 71.0 in | Wt 191.0 lb

## 2017-12-20 DIAGNOSIS — I480 Paroxysmal atrial fibrillation: Secondary | ICD-10-CM | POA: Diagnosis not present

## 2017-12-20 DIAGNOSIS — I10 Essential (primary) hypertension: Secondary | ICD-10-CM

## 2017-12-20 DIAGNOSIS — N183 Chronic kidney disease, stage 3 unspecified: Secondary | ICD-10-CM

## 2017-12-20 DIAGNOSIS — E782 Mixed hyperlipidemia: Secondary | ICD-10-CM | POA: Diagnosis not present

## 2017-12-20 DIAGNOSIS — E038 Other specified hypothyroidism: Secondary | ICD-10-CM

## 2017-12-20 DIAGNOSIS — R7309 Other abnormal glucose: Secondary | ICD-10-CM | POA: Diagnosis not present

## 2017-12-20 DIAGNOSIS — Z0000 Encounter for general adult medical examination without abnormal findings: Secondary | ICD-10-CM

## 2017-12-20 DIAGNOSIS — Z7901 Long term (current) use of anticoagulants: Secondary | ICD-10-CM | POA: Diagnosis not present

## 2017-12-20 DIAGNOSIS — R972 Elevated prostate specific antigen [PSA]: Secondary | ICD-10-CM | POA: Diagnosis not present

## 2017-12-20 DIAGNOSIS — Z1159 Encounter for screening for other viral diseases: Secondary | ICD-10-CM

## 2017-12-20 NOTE — Assessment & Plan Note (Signed)
Followed by Dr Jacqlyn Larsen [facility] Stable PSA approx 4, with known BPH

## 2017-12-20 NOTE — Assessment & Plan Note (Signed)
On Eliquis 5mg  BID daily since 10/2017 for PAF Stable without bleeding or concerns

## 2017-12-20 NOTE — Assessment & Plan Note (Signed)
Stable, controlled Paroxysmal Atrial Fibrillation, >25 yr Followed by Cardiology Dr Rockey Situ Has Amio/Metop PRN only rare occurrence On daily anticoagulation Eliquis 5mg  BID since 10/2017, continues on ASA 81

## 2017-12-20 NOTE — Progress Notes (Signed)
Subjective:    Patient ID: Andrew Benson, male    DOB: [birth year], 71 y.o.   MRN: [id number]  Andrew Benson is a 71 y.o. male presenting on 12/20/2017 for Annual Exam and Hypertension   HPI   Here for Annual Physical and due for yearly labs, not fasting today, will return.  Specialists: Cardiology - Dr Ida Rogue ([place]) Urology - Dr Edrick Oh [facility] Urology) Endocrinology - Dr Lucilla Lame [facility] Endocrinology)  CHRONIC HTN / Paroxysmal Atrial Fibrillation on anticoagulation Reports not checking BP regularly at home, he had a wrist cuff, now has Omron arm cuff, takes sometimes Followed by Dr Rockey Situ Paroxysmal AFib, last visit 11/15/17, he has had it for >25 years, rare flare up x 2 in life, takes Amiodarone PRN, no known trigger, he can feel abnormal irregular beats when he has a flare but this is exceptionally rare. Last 2014. Current Meds - Lisinopril 2.5mg  daily and has Metoprolol 25mg  BID PRN ONLY Afib - Recently started on Eliquis 5mg  BID from Cards, instead of PRN use in past - taking ASA 81mg  daily Reports good compliance, took meds today. Tolerating well, w/o complaints.  HYPERLIPIDEMIA: - Reports no concerns. Last lipid panel 10/2016, controlled  - Currently taking Rosuvastatin 20mg  daily, tolerating well without side effects or myalgias - On ASA 81  CKD-III - Reports chronic history of CKD. His prior labs date back to 2014 in chart, stable Cr 1.3, last lab 10/2016, similar. He is due for repeat labs. Not followed by Nephrology. He was told likely due to elevated BP  History of Central Hypothyroidism - Followed by [facility] Endocrinology Dr Gabriel Carina, last seen 04/2017. He had Low TSH, Low T3, and Low-normal T4, and low-normal update on thyroid scan. Other hormones pituitary normal. He was taken off of low dose Levothyroxine, and expected to follow-up with labs with Endocrine in 04/2018  Elevated PSA / BPH Followed by Dr Edrick Oh [facility]  [facility]. Prior PSA history elevated in 4 range, has been stable. He is followed q 6 months, next scheduled for 05/10/18  Additionally - He continues to work at [employer] and in [employer]: Due for routine Hepatitis C screening, will get lab  UTD PNA vaccines, UTD Flu, UTD TDap  Colon CA Screening: Last Colonoscopy 2013. Now scheduled for next colonoscopy per [facility] Dr Vira Agar 12/29/17. Currently asymptomatic. No known family history of colon CA. Due for screening test   Depression screen [facility] 2/9 12/20/2017 10/13/2016 11/07/2015  Decreased Interest 0 0 0  Down, Depressed, Hopeless 0 0 0  PHQ - 2 Score 0 0 0    Past Medical History:  Diagnosis Date  . Asthma   . Essential hypertension   . Hyperlipidemia   . PAF (paroxysmal atrial fibrillation) (HCC)    a. CHA2DS2VASc = 2;  b. takes amio/eliquis/metoprolol prn for PAF.   Past Surgical History:  Procedure Laterality Date  . FINGER SURGERY     left index finger  . HERNIA REPAIR    . KNEE SURGERY     left    Social History   Socioeconomic History  . Marital status: Married    Spouse name: Not on file  . Number of children: Not on file  . Years of education: Not on file  . Highest education level: Not on file  Social Needs  . Financial resource strain: Not on file  . Food insecurity - worry: Not on file  . Food  insecurity - inability: Not on file  . Transportation needs - medical: Not on file  . Transportation needs - non-medical: Not on file  Occupational History  . Not on file  Tobacco Use  . Smoking status: Former Smoker    Packs/day: 1.00    Years: 20.00    Pack years: 20.00    Types: Cigarettes    Last attempt to quit: 02/01/1993    Years since quitting: 24.8  . Smokeless tobacco: Never Used  Substance and Sexual Activity  . Alcohol use: Yes    Alcohol/week: 0.0 oz    Comment: social drinker.  . Drug use: No  . Sexual activity: Not on file  Other Topics Concern  . Not on file    Social History Narrative  . Not on file   Family History  Problem Relation Age of Onset  . Hyperlipidemia Mother   . Hypertension Mother   . Heart disease Mother   . Heart disease Sister        valve repair   . Hypertension Brother    Current Outpatient Medications on File Prior to Visit  Medication Sig  . amiodarone (PACERONE) 200 MG tablet Take 1 tablet (200 mg total) by mouth 2 (two) times daily as needed (for afib).  Marland Kitchen apixaban (ELIQUIS) 5 MG TABS tablet Take 1 tablet (5 mg total) by mouth 2 (two) times daily.  Marland Kitchen aspirin EC 81 MG tablet Take 81 mg by mouth daily.   Marland Kitchen Co-Enzyme Q-10 30 MG CAPS Take 30 mg by mouth.  Marland Kitchen lisinopril (PRINIVIL,ZESTRIL) 2.5 MG tablet TAKE 1 TABLET (5 MG TOTAL) BY MOUTH DAILY.  . metoprolol tartrate (LOPRESSOR) 25 MG tablet Take 1 tablet (25 mg total) by mouth 2 (two) times daily as needed (for afib).  . Multiple Vitamin (MULTIVITAMIN) capsule Take 1 capsule by mouth daily.  . rosuvastatin (CRESTOR) 20 MG tablet Take 1 tablet (20 mg total) by mouth daily.   No current facility-administered medications on file prior to visit.     Review of Systems  Constitutional: Negative for activity change, appetite change, chills, diaphoresis, fatigue and fever.  HENT: Negative for congestion, hearing loss and sinus pressure.   Eyes: Negative for visual disturbance.  Respiratory: Negative for apnea, cough, choking, chest tightness, shortness of breath and wheezing.   Cardiovascular: Negative for chest pain, palpitations and leg swelling.  Gastrointestinal: Negative for abdominal pain, constipation, diarrhea, nausea and vomiting.  Endocrine: Negative for cold intolerance and polyuria.  Genitourinary: Negative for difficulty urinating, dysuria, frequency, hematuria, penile pain, penile swelling, scrotal swelling and testicular pain.  Musculoskeletal: Negative for arthralgias and neck pain.  Skin: Negative for rash.  Allergic/Immunologic: Negative for environmental  allergies.  Neurological: Negative for dizziness, weakness, light-headedness, numbness and headaches.  Hematological: Negative for adenopathy.  Psychiatric/Behavioral: Negative for behavioral problems, dysphoric mood and sleep disturbance. The patient is not nervous/anxious.    Per HPI unless specifically indicated above     Objective:    BP 130/80   Pulse 78   Temp 98.8 F (37.1 C) (Oral)   Resp 16   Ht 5\' 11"  (1.803 m)   Wt 191 lb (86.6 kg)   BMI 26.64 kg/m   Wt Readings from Last 3 Encounters:  12/20/17 191 lb (86.6 kg)  11/15/17 188 lb 12 oz (85.6 kg)  03/09/17 177 lb (80.3 kg)    Physical Exam  Constitutional: He is oriented to person, place, and time. He appears well-developed and well-nourished. No distress.  Well-appearing,  comfortable, cooperative  HENT:  Head: Normocephalic and atraumatic.  Mouth/Throat: Oropharynx is clear and moist.  Eyes: Conjunctivae and EOM are normal. Pupils are equal, round, and reactive to light. Right eye exhibits no discharge. Left eye exhibits no discharge.  Neck: Normal range of motion. Neck supple. No thyromegaly present.  Cardiovascular: Normal rate, regular rhythm, normal heart sounds and intact distal pulses.  No murmur heard. Pulmonary/Chest: Effort normal and breath sounds normal. No respiratory distress. He has no wheezes. He has no rales.  Abdominal: Soft. Bowel sounds are normal. He exhibits no distension and no mass. There is no tenderness.  Genitourinary:  Genitourinary Comments: Deferred DRE  Musculoskeletal: Normal range of motion. He exhibits no edema or tenderness.  Upper / Lower Extremities: - Normal muscle tone, strength bilateral upper extremities 5/5, lower extremities 5/5  Lymphadenopathy:    He has no cervical adenopathy.  Neurological: He is alert and oriented to person, place, and time.  Distal sensation intact to light touch all extremities  Skin: Skin is warm and dry. No rash noted. He is not diaphoretic. No  erythema.  Psychiatric: He has a normal mood and affect. His behavior is normal.  Well groomed, good eye contact, normal speech and thoughts  Nursing note and vitals reviewed.  Results for orders placed or performed in visit on 10/13/16  T4  Result Value Ref Range   T4, Total 4.8 4.5 - 12.0 ug/dL  T3  Result Value Ref Range   T3, Total 88.0 76 - 181 ng/dL      Assessment & Plan:   Problem List Items Addressed This Visit    Atrial fibrillation (HCC)    Stable, controlled Paroxysmal Atrial Fibrillation, >25 yr Followed by Cardiology Dr Rockey Situ Has Amio/Metop PRN only rare occurrence On daily anticoagulation Eliquis 5mg  BID since 10/2017, continues on ASA 81      Relevant Orders   CBC with Differential/Platelet   Central hypothyroidism    Stable, off levothyroxine Followed by [facility] Endocrinology Dr Gabriel Carina Due for repeat thyroid panel in 04/2018      Chronic anticoagulation    On Eliquis 5mg  BID daily since 10/2017 for PAF Stable without bleeding or concerns      Relevant Orders   CBC with Differential/Platelet   CKD (chronic kidney disease), stage III ([place])    Stable CKD-III with GFR < 60, unchanged Cr and labs for >4 years now on chart review Avoid NSAIDs Improve Hydration Check labs, Cr trend, in future can monitor urine microalbumin. Already on low dose ACEi      Relevant Orders   COMPLETE METABOLIC PANEL WITH GFR   CBC with Differential/Platelet   Elevated PSA    Followed by Dr Jacqlyn Larsen [facility] Urology Stable PSA approx 4, with known BPH      Essential hypertension    Well-controlled HTN - Home BP readings stable  Complication CKD-III, PAFib, on anticoag   Plan:  1. Continue current BP regimen - Lisinopril 2.5mg  for renal protection - in future consider daily metoprolol if needed 2. Encourage improved lifestyle - low sodium diet, regular exercise 3. Continue monitor BP outside office, bring readings to next visit, if persistently >140/90 or new symptoms notify office  sooner 4. Follow-up q 6 months      Relevant Orders   COMPLETE METABOLIC PANEL WITH GFR   Hyperlipidemia    Controlled cholesterol on statin and lifestyle Last lipid panel 10/2016  Plan: 1. Continue current meds - Rosuvastatin 20mg  daily 2. Continue ASA 81mg   for primary ASCVD risk reduction 3. Encourage improved lifestyle - low carb/cholesterol, reduce portion size, continue improving regular exercise 4. Check fasting lipids in 2 days, then q 6-12 mo follow-up      Relevant Orders   Lipid panel    Other Visit Diagnoses    Annual physical exam    -  Primary   Relevant Orders   COMPLETE METABOLIC PANEL WITH GFR   Lipid panel   Hemoglobin A1c   CBC with Differential/Platelet   Abnormal glucose       Relevant Orders   Hemoglobin A1c   Need for hepatitis C screening test       Relevant Orders   Hepatitis C antibody      No orders of the defined types were placed in this encounter.   Follow up plan: Return in about 6 months (around 06/19/2018) for HTN.   Future lab orders placed for 2 days from now.  [name], DO [facility] 12/20/2017, 10:18 AM

## 2017-12-20 NOTE — Assessment & Plan Note (Signed)
Well-controlled HTN - Home BP readings stable  Complication CKD-III, PAFib, on anticoag   Plan:  1. Continue current BP regimen - Lisinopril 2.5mg  for renal protection - in future consider daily metoprolol if needed 2. Encourage improved lifestyle - low sodium diet, regular exercise 3. Continue monitor BP outside office, bring readings to next visit, if persistently >140/90 or new symptoms notify office sooner 4. Follow-up q 6 months

## 2017-12-20 NOTE — Assessment & Plan Note (Signed)
Controlled cholesterol on statin and lifestyle Last lipid panel 10/2016  Plan: 1. Continue current meds - Rosuvastatin 20mg  daily 2. Continue ASA 81mg  for primary ASCVD risk reduction 3. Encourage improved lifestyle - low carb/cholesterol, reduce portion size, continue improving regular exercise 4. Check fasting lipids in 2 days, then q 6-12 mo follow-up

## 2017-12-20 NOTE — Assessment & Plan Note (Signed)
Stable CKD-III with GFR < 60, unchanged Cr and labs for >4 years now on chart review Avoid NSAIDs Improve Hydration Check labs, Cr trend, in future can monitor urine microalbumin. Already on low dose ACEi

## 2017-12-20 NOTE — Patient Instructions (Addendum)
Thank you for coming to the office today.  1. No changes today, keep up the great work  2.  DUE for FASTING BLOOD WORK (no food or drink after midnight before the lab appointment, only water or coffee without cream/sugar on the morning of)  SCHEDULE "Lab Only" visit in the morning at the clinic for lab draw in 6 MONTHS  - Make sure Lab Only appointment is at about 1 week before your next appointment, so that results will be available  For Lab Results, once available within 2-3 days of blood draw, you can can log in to MyChart online to view your results and a brief explanation. Also, we can discuss results at next follow-up visit.   Please schedule a Follow-up Appointment to: Return in about 6 months (around 06/19/2018) for HTN.    If you have any other questions or concerns, please feel free to call the office or send a message through [facility]. You may also schedule an earlier appointment if necessary.  Additionally, you may be receiving a survey about your experience at our office within a few days to 1 week by e-mail or mail. We value your feedback.  [name], DO [place]

## 2017-12-20 NOTE — Assessment & Plan Note (Signed)
Stable, off levothyroxine Followed by [facility] Endocrinology Dr Gabriel Carina Due for repeat thyroid panel in 04/2018

## 2017-12-22 ENCOUNTER — Other Ambulatory Visit: Payer: Medicare Other

## 2017-12-22 DIAGNOSIS — R7309 Other abnormal glucose: Secondary | ICD-10-CM

## 2017-12-22 DIAGNOSIS — N183 Chronic kidney disease, stage 3 unspecified: Secondary | ICD-10-CM

## 2017-12-22 DIAGNOSIS — E782 Mixed hyperlipidemia: Secondary | ICD-10-CM | POA: Diagnosis not present

## 2017-12-22 DIAGNOSIS — I480 Paroxysmal atrial fibrillation: Secondary | ICD-10-CM | POA: Diagnosis not present

## 2017-12-22 DIAGNOSIS — I10 Essential (primary) hypertension: Secondary | ICD-10-CM | POA: Diagnosis not present

## 2017-12-22 DIAGNOSIS — Z1159 Encounter for screening for other viral diseases: Secondary | ICD-10-CM

## 2017-12-22 DIAGNOSIS — Z7901 Long term (current) use of anticoagulants: Secondary | ICD-10-CM

## 2017-12-22 DIAGNOSIS — Z0000 Encounter for general adult medical examination without abnormal findings: Secondary | ICD-10-CM

## 2017-12-23 LAB — CBC WITH DIFFERENTIAL/PLATELET
BASOS PCT: 1 %
Basophils Absolute: 40 cells/uL (ref 0–200)
Eosinophils Absolute: 132 cells/uL (ref 15–500)
Eosinophils Relative: 3.3 %
HEMATOCRIT: 41.1 % (ref 38.5–50.0)
Hemoglobin: 14.4 g/dL (ref 13.2–17.1)
LYMPHS ABS: 1360 {cells}/uL (ref 850–3900)
MCH: 30.4 pg (ref 27.0–33.0)
MCHC: 35 g/dL (ref 32.0–36.0)
MCV: 86.9 fL (ref 80.0–100.0)
MPV: 11.4 fL (ref 7.5–12.5)
Monocytes Relative: 9.8 %
Neutro Abs: 2076 cells/uL (ref 1500–7800)
Neutrophils Relative %: 51.9 %
PLATELETS: 129 10*3/uL — AB (ref 140–400)
RBC: 4.73 10*6/uL (ref 4.20–5.80)
RDW: 14.5 % (ref 11.0–15.0)
TOTAL LYMPHOCYTE: 34 %
WBC: 4 10*3/uL (ref 3.8–10.8)
WBCMIX: 392 {cells}/uL (ref 200–950)

## 2017-12-23 LAB — COMPLETE METABOLIC PANEL WITH GFR
AG RATIO: 1.5 (calc) (ref 1.0–2.5)
ALBUMIN MSPROF: 3.7 g/dL (ref 3.6–5.1)
ALT: 25 U/L (ref 9–46)
AST: 26 U/L (ref 10–35)
Alkaline phosphatase (APISO): 72 U/L (ref 40–115)
BUN / CREAT RATIO: 14 (calc) (ref 6–22)
BUN: 19 mg/dL (ref 7–25)
CHLORIDE: 106 mmol/L (ref 98–110)
CO2: 26 mmol/L (ref 20–32)
Calcium: 9.1 mg/dL (ref 8.6–10.3)
Creat: 1.38 mg/dL — ABNORMAL HIGH (ref 0.70–1.18)
GFR, EST AFRICAN AMERICAN: 60 mL/min/{1.73_m2} (ref >=60)
GFR, EST NON AFRICAN AMERICAN: 51 mL/min/{1.73_m2} — AB (ref >=60)
GLUCOSE: 97 mg/dL (ref 65–99)
Globulin: 2.5 g/dL (calc) (ref 1.9–3.7)
Potassium: 3.9 mmol/L (ref 3.5–5.3)
Sodium: 139 mmol/L (ref 135–146)
TOTAL PROTEIN: 6.2 g/dL (ref 6.1–8.1)
Total Bilirubin: 0.9 mg/dL (ref 0.2–1.2)

## 2017-12-23 LAB — LIPID PANEL
Cholesterol: 146 mg/dL (ref <200)
HDL: 55 mg/dL (ref >40)
LDL Cholesterol (Calc): 73 mg/dL (calc)
Non-HDL Cholesterol (Calc): 91 mg/dL (calc) (ref <130)
Total CHOL/HDL Ratio: 2.7 (calc) (ref <5.0)
Triglycerides: 95 mg/dL (ref <150)

## 2017-12-23 LAB — HEMOGLOBIN A1C
EAG (MMOL/L): 6.2 (calc)
Hgb A1c MFr Bld: 5.5 % of total Hgb (ref <5.7)
Mean Plasma Glucose: 111 (calc)

## 2017-12-24 ENCOUNTER — Telehealth: Payer: Self-pay | Admitting: Cardiovascular Disease

## 2017-12-24 NOTE — Telephone Encounter (Signed)
Patient last saw Dr Rockey Situ on 11/15/17.  Routing to Dr Rockey Situ for clearance.

## 2017-12-24 NOTE — Telephone Encounter (Signed)
Spoke with patient and reviewed that Dr. Rockey Situ is in the hospital seeing patients at this time and that we are waiting on his review. He verbalized understanding and states that he just really does not want to have his test canceled. Advised that I would do my best but he is currently not in the office. He was understanding but hopeful we could get it done today.

## 2017-12-24 NOTE — Telephone Encounter (Signed)
Patient calling regarding status of Cardiac Clearance Told him we are waiting for Dr. Rockey Situ to review Please call

## 2017-12-24 NOTE — Telephone Encounter (Signed)
° °  [facility] Pre-operative Risk Assessment    Request for surgical clearance:  1. What type of surgery is being performed? Colonoscopy  2. When is this surgery scheduled? 12/29/17  3. Are there any medications that need to be held prior to surgery and how long? Eliqus  4. Practice name and name of physician performing surgery? Not listed   5. What is your office phone and fax number? Phone [phone] Fax [phone]  6. Anesthesia type (None, local, MAC, general) ? Not listed   Placed in nurses bin  _________________________________________________________________

## 2017-12-25 NOTE — Telephone Encounter (Signed)
Acceptable risk for procedure/colo Ok to hold eliquis 2 days prior to procedure None on Monday Tuesday, wed

## 2017-12-26 ENCOUNTER — Encounter: Payer: Self-pay | Admitting: Cardiovascular Disease

## 2017-12-27 NOTE — Telephone Encounter (Signed)
Clearance routed to number provided.  

## 2017-12-28 ENCOUNTER — Encounter: Payer: Self-pay | Admitting: *Deleted

## 2017-12-29 ENCOUNTER — Ambulatory Visit
Admission: RE | Admit: 2017-12-29 | Discharge: 2017-12-29 | Disposition: A | Discharge status: Home / Self Care | Payer: Medicare Other | Source: Ambulatory Visit | Attending: Unknown Physician Specialty | Admitting: Unknown Physician Specialty

## 2017-12-29 ENCOUNTER — Ambulatory Visit: Payer: Medicare Other | Admitting: Anesthesiology

## 2017-12-29 ENCOUNTER — Encounter
Admission: RE | Disposition: A | Discharge status: Home / Self Care | Payer: Self-pay | Source: Ambulatory Visit | Attending: Unknown Physician Specialty

## 2017-12-29 ENCOUNTER — Encounter: Payer: Self-pay | Admitting: *Deleted

## 2017-12-29 DIAGNOSIS — E039 Hypothyroidism, unspecified: Secondary | ICD-10-CM | POA: Diagnosis not present

## 2017-12-29 DIAGNOSIS — J45909 Unspecified asthma, uncomplicated: Secondary | ICD-10-CM | POA: Diagnosis not present

## 2017-12-29 DIAGNOSIS — D120 Benign neoplasm of cecum: Secondary | ICD-10-CM | POA: Diagnosis not present

## 2017-12-29 DIAGNOSIS — K635 Polyp of colon: Secondary | ICD-10-CM | POA: Insufficient documentation

## 2017-12-29 DIAGNOSIS — Z7982 Long term (current) use of aspirin: Secondary | ICD-10-CM | POA: Diagnosis not present

## 2017-12-29 DIAGNOSIS — E785 Hyperlipidemia, unspecified: Secondary | ICD-10-CM | POA: Diagnosis not present

## 2017-12-29 DIAGNOSIS — K648 Other hemorrhoids: Secondary | ICD-10-CM | POA: Diagnosis not present

## 2017-12-29 DIAGNOSIS — Z880 Allergy status to penicillin: Secondary | ICD-10-CM | POA: Diagnosis not present

## 2017-12-29 DIAGNOSIS — Z8719 Personal history of other diseases of the digestive system: Secondary | ICD-10-CM | POA: Insufficient documentation

## 2017-12-29 DIAGNOSIS — Z79899 Other long term (current) drug therapy: Secondary | ICD-10-CM | POA: Diagnosis not present

## 2017-12-29 DIAGNOSIS — Z09 Encounter for follow-up examination after completed treatment for conditions other than malignant neoplasm: Secondary | ICD-10-CM | POA: Diagnosis present

## 2017-12-29 DIAGNOSIS — K5730 Diverticulosis of large intestine without perforation or abscess without bleeding: Secondary | ICD-10-CM | POA: Diagnosis not present

## 2017-12-29 DIAGNOSIS — D123 Benign neoplasm of transverse colon: Secondary | ICD-10-CM | POA: Insufficient documentation

## 2017-12-29 DIAGNOSIS — Z87891 Personal history of nicotine dependence: Secondary | ICD-10-CM | POA: Insufficient documentation

## 2017-12-29 DIAGNOSIS — I480 Paroxysmal atrial fibrillation: Secondary | ICD-10-CM | POA: Insufficient documentation

## 2017-12-29 DIAGNOSIS — Z7901 Long term (current) use of anticoagulants: Secondary | ICD-10-CM | POA: Diagnosis not present

## 2017-12-29 DIAGNOSIS — K5790 Diverticulosis of intestine, part unspecified, without perforation or abscess without bleeding: Secondary | ICD-10-CM | POA: Diagnosis not present

## 2017-12-29 DIAGNOSIS — Z86010 Personal history of colonic polyps: Secondary | ICD-10-CM | POA: Diagnosis not present

## 2017-12-29 DIAGNOSIS — N183 Chronic kidney disease, stage 3 (moderate): Secondary | ICD-10-CM | POA: Diagnosis not present

## 2017-12-29 DIAGNOSIS — I10 Essential (primary) hypertension: Secondary | ICD-10-CM | POA: Diagnosis not present

## 2017-12-29 DIAGNOSIS — K640 First degree hemorrhoids: Secondary | ICD-10-CM | POA: Insufficient documentation

## 2017-12-29 DIAGNOSIS — I129 Hypertensive chronic kidney disease with stage 1 through stage 4 chronic kidney disease, or unspecified chronic kidney disease: Secondary | ICD-10-CM | POA: Diagnosis not present

## 2017-12-29 HISTORY — PX: COLONOSCOPY WITH PROPOFOL: SHX5780

## 2017-12-29 SURGERY — COLONOSCOPY WITH PROPOFOL
Anesthesia: General

## 2017-12-29 MED ORDER — PROPOFOL 500 MG/50ML IV EMUL
INTRAVENOUS | Status: AC
  Filled 2017-12-29: qty 50

## 2017-12-29 MED ORDER — PROPOFOL 10 MG/ML IV BOLUS
INTRAVENOUS | Status: AC
Start: 2017-12-29 — End: ?
  Filled 2017-12-29: qty 20

## 2017-12-29 MED ORDER — SODIUM CHLORIDE 0.9 % IV SOLN: INTRAVENOUS | Status: DC

## 2017-12-29 MED ORDER — SODIUM CHLORIDE 0.9 % IV SOLN
INTRAVENOUS | Status: DC
  Administered 2017-12-29: 09:00:00 via INTRAVENOUS

## 2017-12-29 MED ORDER — PROPOFOL 10 MG/ML IV BOLUS
INTRAVENOUS | Status: DC | PRN
  Administered 2017-12-29: 100 mg via INTRAVENOUS

## 2017-12-29 MED ORDER — FENTANYL CITRATE (PF) 100 MCG/2ML IJ SOLN
INTRAMUSCULAR | Status: DC | PRN
  Administered 2017-12-29 (×2): 50 ug via INTRAVENOUS

## 2017-12-29 MED ORDER — LIDOCAINE 2% (20 MG/ML) 5 ML SYRINGE
INTRAMUSCULAR | Status: DC | PRN
  Administered 2017-12-29: 40 mg via INTRAVENOUS

## 2017-12-29 MED ORDER — PHENYLEPHRINE HCL 10 MG/ML IJ SOLN
INTRAMUSCULAR | Status: DC | PRN
  Administered 2017-12-29 (×2): 100 ug via INTRAVENOUS

## 2017-12-29 MED ORDER — FENTANYL CITRATE (PF) 100 MCG/2ML IJ SOLN
INTRAMUSCULAR | Status: AC
  Filled 2017-12-29: qty 2

## 2017-12-29 MED ORDER — PROPOFOL 500 MG/50ML IV EMUL
INTRAVENOUS | Status: DC | PRN
  Administered 2017-12-29: 140 ug/kg/min via INTRAVENOUS

## 2017-12-29 MED ORDER — LIDOCAINE HCL (PF) 2 % IJ SOLN
INTRAMUSCULAR | Status: AC
  Filled 2017-12-29: qty 20

## 2017-12-29 NOTE — Anesthesia Postprocedure Evaluation (Signed)
Anesthesia Post Note  Patient: Andrew Benson  Procedure(s) Performed: COLONOSCOPY WITH PROPOFOL (N/A )  Patient location during evaluation: PACU Anesthesia Type: General Level of consciousness: awake and alert and oriented Pain management: pain level controlled Vital Signs Assessment: post-procedure vital signs reviewed and stable Respiratory status: spontaneous breathing Cardiovascular status: blood pressure returned to baseline Anesthetic complications: no     Last Vitals:  Vitals:   12/29/17 1007 12/29/17 1017  BP: 114/80 (!) 144/101  Pulse: 61 74  Resp: 15 18  Temp:    SpO2: 100% 99%    Last Pain:  Vitals:   12/29/17 0937  TempSrc: Tympanic                 [name]

## 2017-12-29 NOTE — Op Note (Signed)
[facility] Gastroenterology Patient Name: Andrew Benson Procedure Date: 12/29/2017 8:42 AM MRN: [id number] Account #: [id number] Date of Birth: 1946/03/12 Admit Type: Outpatient Age: 72 Room: [facility] ENDO ROOM 4 Gender: Male Note Status: Finalized Procedure:            Colonoscopy Indications:          High risk colon cancer surveillance: Personal history                        of colonic polyps Providers:            Manya Silvas, MD Referring MD:         Olin Hauser (Referring MD) Medicines:            Propofol per Anesthesia Complications:        No immediate complications. Procedure:            Pre-Anesthesia Assessment:                       - After reviewing the risks and benefits, the patient                        was deemed in satisfactory condition to undergo the                        procedure.                       After obtaining informed consent, the colonoscope was                        passed under direct vision. Throughout the procedure,                        the patient's blood pressure, pulse, and oxygen                        saturations were monitored continuously. The                        Colonoscope was introduced through the anus and                        advanced to the the cecum, identified by appendiceal                        orifice and ileocecal valve. The colonoscopy was                        performed without difficulty. The patient tolerated the                        procedure well. The quality of the bowel preparation                        was good. Findings:      A diminutive polyp was found in the cecum. The polyp was sessile. The       polyp was removed with a jumbo cold forceps. Resection and retrieval       were complete.      A diminutive polyp was found in  the cecum. The polyp was sessile. The       polyp was removed with a cold snare. Resection and retrieval were       complete.      A medium  polyp was found in the ileocecal valve. The polyp was sessile.       The polyp was removed with a hot snare. Resection and retrieval were       complete.      A small polyp was found in the hepatic flexure. The polyp was sessile.       The polyp was removed with a hot snare. Resection and retrieval were       complete.      A small polyp was found in the transverse colon. The polyp was sessile.       The polyp was removed with a hot snare. Resection and retrieval were       complete.      Multiple small-mouthed diverticula were found in the sigmoid colon,       descending colon and transverse colon.      Internal hemorrhoids were found during endoscopy. The hemorrhoids were       small and Grade I (internal hemorrhoids that do not prolapse). Impression:           - One diminutive polyp in the cecum, removed with a                        jumbo cold forceps. Resected and retrieved.                       - One diminutive polyp in the cecum, removed with a                        cold snare. Resected and retrieved.                       - One medium polyp at the ileocecal valve, removed with                        a hot snare. Resected and retrieved.                       - One small polyp at the hepatic flexure, removed with                        a hot snare. Resected and retrieved.                       - One small polyp in the transverse colon, removed with                        a hot snare. Resected and retrieved.                       - Diverticulosis in the sigmoid colon, in the                        descending colon and in the transverse colon.                       - Internal hemorrhoids. Recommendation:       -  Await pathology results. Manya Silvas, MD 12/29/2017 9:35:26 AM This report has been signed electronically. Number of Addenda: 0 Note Initiated On: 12/29/2017 8:42 AM Scope Withdrawal Time: 0 hours 20 minutes 59 seconds  Total Procedure Duration: 0 hours 31 minutes 59  seconds       [facility]

## 2017-12-29 NOTE — H&P (Signed)
Primary Care Physician:  Olin Hauser, DO Primary Gastroenterologist:  Dr. Vira Agar  Pre-Procedure History & Physical: HPI:  Andrew Benson is a 72 y.o. male is here for an colonoscopy.   Past Medical History:  Diagnosis Date  . Asthma   . Essential hypertension   . Hyperlipidemia   . PAF (paroxysmal atrial fibrillation) (HCC)    a. CHA2DS2VASc = 2;  b. takes amio/eliquis/metoprolol prn for PAF.    Past Surgical History:  Procedure Laterality Date  . FINGER SURGERY     left index finger  . HERNIA REPAIR    . INGUINAL HERNIA REPAIR    . KNEE SURGERY     left     Prior to Admission medications   Medication Sig Start Date End Date Taking? Authorizing Provider  aspirin EC 81 MG tablet Take 81 mg by mouth daily.  01/07/06  Yes [provider]  Co-Enzyme Q-10 30 MG CAPS Take 30 mg by mouth.   Yes [provider]  lisinopril (PRINIVIL,ZESTRIL) 2.5 MG tablet TAKE 1 TABLET (5 MG TOTAL) BY MOUTH DAILY. Patient taking differently: 2.5 mg. TAKE 2 TABLETs (5 MG TOTAL) BY MOUTH DAILY. 03/09/17  Yes Arlis Porta., MD  Multiple Vitamin (MULTIVITAMIN) capsule Take 1 capsule by mouth daily.   Yes [provider]  rosuvastatin (CRESTOR) 20 MG tablet Take 1 tablet (20 mg total) by mouth daily. 03/09/17  Yes Arlis Porta., MD  amiodarone (PACERONE) 200 MG tablet Take 1 tablet (200 mg total) by mouth 2 (two) times daily as needed (for afib). 01/10/16   Minna Merritts, MD  apixaban (ELIQUIS) 5 MG TABS tablet Take 1 tablet (5 mg total) by mouth 2 (two) times daily. 11/15/17   Minna Merritts, MD  baclofen (LIORESAL) 10 MG tablet Take 10 mg by mouth.    [provider]  levothyroxine (SYNTHROID, LEVOTHROID) 50 MCG tablet Take 50 mcg by mouth daily before breakfast.    [provider]  metoprolol tartrate (LOPRESSOR) 25 MG tablet Take 1 tablet (25 mg total) by mouth 2 (two) times daily as needed (for afib). 01/10/16   Minna Merritts, MD    Allergies as of 11/08/2017 - Review Complete 03/09/2017  Allergen Reaction Noted  . Penicillins  10/26/2013    Family History  Problem Relation Age of Onset  . Hyperlipidemia Mother   . Hypertension Mother   . Heart disease Mother   . Heart disease Sister        valve repair   . Hypertension Brother     Social History   Socioeconomic History  . Marital status: Married    Spouse name: Not on file  . Number of children: Not on file  . Years of education: Not on file  . Highest education level: Not on file  Social Needs  . Financial resource strain: Not on file  . Food insecurity - worry: Not on file  . Food insecurity - inability: Not on file  . Transportation needs - medical: Not on file  . Transportation needs - non-medical: Not on file  Occupational History  . Not on file  Tobacco Use  . Smoking status: Former Smoker    Packs/day: 1.00    Years: 20.00    Pack years: 20.00    Types: Cigarettes    Last attempt to quit: 02/01/1993    Years since quitting: 24.9  . Smokeless tobacco: Never Used  Substance and Sexual Activity  .  Alcohol use: Yes    Alcohol/week: 0.0 oz    Comment: social drinker.  . Drug use: No  . Sexual activity: Not on file  Other Topics Concern  . Not on file  Social History Narrative  . Not on file    Review of Systems: See HPI, otherwise negative ROS  Physical Exam: BP (!) 134/92   Pulse 90   Temp (!) 97.3 F (36.3 C) (Tympanic)   Resp (!) 22   Ht 5\' 11"  (1.803 m)   Wt 81.6 kg (180 lb)   SpO2 100%   BMI 25.10 kg/m  General:   Alert,  pleasant and cooperative in NAD Head:  Normocephalic and atraumatic. Neck:  Supple; no masses or thyromegaly. Lungs:  Clear throughout to auscultation.    Heart:  Regular rate and rhythm. Abdomen:  Soft, nontender and nondistended. Normal bowel sounds, without guarding, and without rebound.   Neurologic:  Alert and  oriented x4;  grossly normal  neurologically.  Impression/Plan: [name] is here for an colonoscopy to be performed for [facility] colon polyps.  Risks, benefits, limitations, and alternatives regarding  colonoscopy have been reviewed with the patient.  Questions have been answered.  All parties agreeable.   Gaylyn Cheers, MD  12/29/2017, 8:48 AM

## 2017-12-29 NOTE — Anesthesia Preprocedure Evaluation (Signed)
Anesthesia Evaluation  Patient identified by MRN, date of birth, ID band Patient awake    Reviewed: Allergy & Precautions, NPO status , Patient's Chart, lab work & pertinent test results, reviewed documented beta blocker date and time   Airway Mallampati: II  TM Distance: >3 FB     Dental  (+) Teeth Intact   Pulmonary asthma , former smoker,    Pulmonary exam normal        Cardiovascular hypertension, Pt. on medications and Pt. on home beta blockers Normal cardiovascular exam+ dysrhythmias Atrial Fibrillation      Neuro/Psych negative neurological ROS  negative psych ROS   GI/Hepatic negative GI ROS, Neg liver ROS,   Endo/Other  Hypothyroidism   Renal/GU Renal InsufficiencyRenal disease  negative genitourinary   Musculoskeletal negative musculoskeletal ROS (+)   Abdominal Normal abdominal exam  (+)   Peds negative pediatric ROS (+)  Hematology negative hematology ROS (+)   Anesthesia Other Findings   Reproductive/Obstetrics                             Anesthesia Physical  Anesthesia Plan  ASA: III  Anesthesia Plan: General   Post-op Pain Management:    Induction: Intravenous  PONV Risk Score and Plan:   Airway Management Planned: Nasal Cannula  Additional Equipment:   Intra-op Plan:   Post-operative Plan:   Informed Consent: I have reviewed the patients History and Physical, chart, labs and discussed the procedure including the risks, benefits and alternatives for the proposed anesthesia with the patient or authorized representative who has indicated his/her understanding and acceptance.     Dental advisory given  Plan Discussed with: CRNA and Surgeon  Anesthesia Plan Comments:         Anesthesia Quick Evaluation  

## 2017-12-29 NOTE — Transfer of Care (Signed)
Immediate Anesthesia Transfer of Care Note  Patient: Andrew Benson  Procedure(s) Performed: COLONOSCOPY WITH PROPOFOL (N/A )  Patient Location: PACU and Endoscopy Unit  Anesthesia Type:General  Level of Consciousness: sedated  Airway & Oxygen Therapy: Patient Spontanous Breathing and Patient connected to nasal cannula oxygen  Post-op Assessment: Report given to RN and Post -op Vital signs reviewed and stable  Post vital signs: Reviewed and stable  Last Vitals:  Vitals:   12/29/17 0825  BP: (!) 134/92  Pulse: 90  Resp: (!) 22  Temp: (!) 36.3 C  SpO2: 100%    Last Pain:  Vitals:   12/29/17 0825  TempSrc: Tympanic         Complications: No apparent anesthesia complications

## 2017-12-29 NOTE — Anesthesia Post-op Follow-up Note (Signed)
Anesthesia QCDR form completed.        

## 2017-12-30 ENCOUNTER — Encounter: Payer: Self-pay | Admitting: Unknown Physician Specialty

## 2017-12-30 LAB — SURGICAL PATHOLOGY

## 2018-01-07 ENCOUNTER — Telehealth: Payer: Self-pay | Admitting: Cardiovascular Disease

## 2018-01-07 NOTE — Telephone Encounter (Signed)
I spoke with the patient- he states that about about 9:30 am today he was driving and started to feel lightheaded- he pulled over and checked his HR- he was running 180's-209. He took a dose of his PRN amiodarone and PRN metoprolol about 10:00 am. Symptoms lasted about 1 hour and his HR's came down to the 80-90 range. He just checked this while on the phone with me and his is 66 bpm now.  I inquired if he is taking his eliquis 5 mg BID- he states he usually just takes the morning dose. I advised him he must take this twice daily to decrease his risk of stroke due to the a-fib.  He voices understanding.  I also advised him that if his a-fib returns and last 8-12 hours with rapid heart rates, he should report to the ER. If symptoms are short lived, but become more frequent, he should call the office and let Dr. Rockey Situ know.  Otherwise, he is advised that he may continue with PRN amiodarone & metoprolol as prescribed by Dr. Rockey Situ, but he must take his eliquis BID. He voices understanding.

## 2018-01-07 NOTE — Telephone Encounter (Signed)
Patient thinks he was in afib earlier and took amiodarone and metoprolol   Patient wants to know if he should take any additional meds or if he needs to come to the office   Please call.

## 2018-01-13 ENCOUNTER — Telehealth: Payer: Self-pay | Admitting: Cardiovascular Disease

## 2018-01-13 NOTE — Telephone Encounter (Signed)
Returned call to patient. Patient reports experiencing dizziness today while walking on the treadmill. Patient denies chest pain, shortness of breath. Says he occasionally feels palpitations but did not today or "much lately." Last week he called with elevated heart rate and took doses of his prn metoprolol and amiodarone. He has not taking any of those medications since Monday. He is taking his Eliquis. Patient does not take BP daily and has not taken it today. Offered patient appointment to see Dr Rockey Situ on 01/18/18 at 2:20pm at first available and he agreed. Pt verbalized understanding to call 911 or go to the emergency room, if he develops any new or worsening symptoms.

## 2018-01-13 NOTE — Telephone Encounter (Signed)
Pt states he gets lightheaded when he walks on the treadmill. States he is not taking any medication for his Afib. He doesn't feel like he is in afib now, but is concerned about his lightheadedness.

## 2018-01-16 NOTE — Progress Notes (Signed)
Cardiology Office Note  Date:  01/18/2018   ID:  Andrew Benson, DOB 1946/07/21, MRN [id number]  PCP:  Olin Hauser, DO   Chief Complaint  Patient presents with  . Other    Patient c/o lightheadedness and high heart rate. Meds reviewed verbally with patient.    HPI:  Andrew Benson is a pleasant 72 year old gentleman with remote history of  atrial fibrillation 25 years ago,  remote history of smoking for 20 years,  Previously had atrial fibrillation while he was on his tractor in 2015, developed acute onset of palpitations, presented to the emergency room  atrial fib 10/26/13 10/31/13 NSR who presents for routine followup for atrial fibrillation.   Recently up in [place] seeing grandchildren Had large coffee from [employer] Typically drinks decaf but this time had caffeinated Shortly after developed profound tachycardia rate up to 200 bpm Lasted approximately 20 minutes before terminating Towards the tail end of the arrhythmia took metoprolol possibly amiodarone Seem to feel better after it was all over  Continues to have episodes of paroxysmal atrial fibrillation, not on a frequent basis Typically in the morning, less symptomatic rate in the 130s  Otherwise feels well with no complaints No chest pain, active  chads VASC  score of 2   on Crestor 20 mgrams daily  Total chol 130, LDL 73  EKG personally reviewed by myself on today's visit shows normal sinus rhythm with rate 60 bpm, no significant ST or T wave changes   PMH:   has a past medical history of Asthma, Essential hypertension, Hyperlipidemia, and PAF (paroxysmal atrial fibrillation) ([place]).  PSH:    Past Surgical History:  Procedure Laterality Date  . COLONOSCOPY WITH PROPOFOL N/A 12/29/2017   Procedure: COLONOSCOPY WITH PROPOFOL;  Surgeon: Manya Silvas, MD;  Location: [facility] ENDOSCOPY;  Service: Endoscopy;  Laterality: N/A;  . FINGER SURGERY     left index finger  . HERNIA REPAIR    . INGUINAL  HERNIA REPAIR    . KNEE SURGERY     left     Current Outpatient Medications  Medication Sig Dispense Refill  . amiodarone (PACERONE) 200 MG tablet Take 1 tablet (200 mg total) by mouth 2 (two) times daily as needed (for afib). 30 tablet 2  . apixaban (ELIQUIS) 5 MG TABS tablet Take 1 tablet (5 mg total) by mouth 2 (two) times daily. 60 tablet 11  . aspirin EC 81 MG tablet Take 81 mg by mouth daily.     Marland Kitchen Co-Enzyme Q-10 30 MG CAPS Take 30 mg by mouth.    Marland Kitchen lisinopril (PRINIVIL,ZESTRIL) 2.5 MG tablet Take 2.5 mg by mouth daily.    . metoprolol tartrate (LOPRESSOR) 25 MG tablet Take 1 tablet (25 mg total) by mouth 2 (two) times daily as needed (for afib). 60 tablet 2  . Multiple Vitamin (MULTIVITAMIN) capsule Take 1 capsule by mouth daily.    . rosuvastatin (CRESTOR) 20 MG tablet Take 1 tablet (20 mg total) by mouth daily. 90 tablet 3   No current facility-administered medications for this visit.      Allergies:   Penicillins   Social History:  The patient  reports that he quit smoking about 24 years ago. His smoking use included cigarettes. He has a 20.00 pack-year smoking history. he has never used smokeless tobacco. He reports that he drinks alcohol. He reports that he does not use drugs.   Family History:   family history includes Heart disease in his mother and sister; Hyperlipidemia  in his mother; Hypertension in his brother and mother.    Review of Systems: Review of Systems  Constitutional: Negative.   Respiratory: Negative.   Cardiovascular: Positive for palpitations.  Gastrointestinal: Negative.   Musculoskeletal: Negative.   Neurological: Negative.   Psychiatric/Behavioral: Negative.   All other systems reviewed and are negative.    PHYSICAL EXAM: VS:  BP 120/82 (BP Location: Left Arm, Patient Position: Sitting, Cuff Size: Normal)   Pulse 60   Ht 5\' 11"  (1.803 m)   Wt 187 lb (84.8 kg)   BMI 26.08 kg/m  , BMI Body mass index is 26.08 kg/m. GEN: Well nourished,  well developed, in no acute distress  HEENT: normal  Neck: no JVD, carotid bruits, or masses Cardiac: RRR; no murmurs, rubs, or gallops,no edema  Respiratory:  clear to auscultation bilaterally, normal work of breathing GI: soft, nontender, nondistended, + BS MS: no deformity or atrophy  Skin: warm and dry, no rash Neuro:  Strength and sensation are intact Psych: euthymic mood, full affect    Recent Labs: 12/22/2017: ALT 25; BUN 19; Creat 1.38; Hemoglobin 14.4; Platelets 129; Potassium 3.9; Sodium 139    Lipid Panel Lab Results  Component Value Date   CHOL 146 12/22/2017   HDL 55 12/22/2017   LDLCALC 73 10/13/2016   TRIG 95 12/22/2017      Wt Readings from Last 3 Encounters:  01/18/18 187 lb (84.8 kg)  12/29/17 180 lb (81.6 kg)  12/20/17 191 lb (86.6 kg)       ASSESSMENT AND PLAN:  Paroxysmal atrial fibrillation (HCC) - Plan: EKG 12-Lead CHADS VASC of 2,  Tolerating  Eliquis 5 mg twice daily Taking amiodarone and metoprolol as needed If he has increasing frequency of episodes may need to add low-dose metoprolol 12.5 twice daily For frequent episodes would add higher dose metoprolol with amiodarone  Supraventricular tachycardia Likely source of his recent arrhythmia lasting 20 minutes or so after drinking large caffeinated coffee.  Seem to terminate on his own but took metoprolol anyway Long discussion concerning mechanism, management  Recommended he call us if he has additional episodes and he will avoid caffeine  Mixed hyperlipidemia Cholesterol is at goal on the current lipid regimen. No changes to the medications were made.  Stable on Crestor  Essential hypertension - Plan: EKG 12-Lead Currently not on any medications for blood pressure  CKD (chronic kidney disease), stage II Stable  Other chest pain No recent episodes of chest pain   Total encounter time more than 25 minutes  Greater than 50% was spent in counseling and coordination of care with the  patient   Disposition:   F/U  12 months   No orders of the defined types were placed in this encounter.    Signed, Esmond Plants, M.D., Ph.D. 01/18/2018  [place], [place]

## 2018-01-18 ENCOUNTER — Ambulatory Visit (INDEPENDENT_AMBULATORY_CARE_PROVIDER_SITE_OTHER): Payer: Medicare Other | Admitting: Cardiovascular Disease

## 2018-01-18 ENCOUNTER — Encounter: Payer: Self-pay | Admitting: Cardiovascular Disease

## 2018-01-18 VITALS — BP 120/82 | HR 60 | Ht 71.0 in | Wt 187.0 lb

## 2018-01-18 DIAGNOSIS — Z87891 Personal history of nicotine dependence: Secondary | ICD-10-CM | POA: Diagnosis not present

## 2018-01-18 DIAGNOSIS — E782 Mixed hyperlipidemia: Secondary | ICD-10-CM

## 2018-01-18 DIAGNOSIS — I471 Supraventricular tachycardia: Secondary | ICD-10-CM | POA: Diagnosis not present

## 2018-01-18 DIAGNOSIS — I10 Essential (primary) hypertension: Secondary | ICD-10-CM | POA: Diagnosis not present

## 2018-01-18 DIAGNOSIS — N182 Chronic kidney disease, stage 2 (mild): Secondary | ICD-10-CM

## 2018-01-18 DIAGNOSIS — N183 Chronic kidney disease, stage 3 unspecified: Secondary | ICD-10-CM | POA: Insufficient documentation

## 2018-01-18 DIAGNOSIS — I480 Paroxysmal atrial fibrillation: Secondary | ICD-10-CM | POA: Diagnosis not present

## 2018-01-18 NOTE — Patient Instructions (Signed)

## 2018-01-20 NOTE — Addendum Note (Signed)
Addended by: Janan Ridge on: 01/20/2018 04:44 PM   Modules accepted: Orders

## 2018-02-01 ENCOUNTER — Ambulatory Visit (INDEPENDENT_AMBULATORY_CARE_PROVIDER_SITE_OTHER): Payer: Medicare Other

## 2018-02-01 VITALS — BP 136/78 | HR 70 | Temp 97.9°F | Resp 16 | Ht 71.0 in | Wt 188.8 lb

## 2018-02-01 DIAGNOSIS — Z0000 Encounter for general adult medical examination without abnormal findings: Secondary | ICD-10-CM | POA: Diagnosis not present

## 2018-02-01 NOTE — Patient Instructions (Addendum)
Mr. Andrew Benson , Thank you for taking time to come for your Medicare Wellness Visit. I appreciate your ongoing commitment to your health goals. Please review the following plan we discussed and let me know if I can assist you in the future.   Screening recommendations/referrals: Colonoscopy: completed 12/29/2017 Recommended yearly ophthalmology/optometry visit for glaucoma screening and checkup Recommended yearly dental visit for hygiene and checkup  Vaccinations: Influenza vaccine: up to date Pneumococcal vaccine: up to date Tdap vaccine: due, check with your insurance company for coverage Shingles vaccine: up to date    Advanced directives: Please bring a copy of your health care power of attorney and living will to the office at your convenience.  Conditions/risks identified: Recommend drinking at least 6-8 glasses of water a day   Next appointment: Follow up in one year for your annual wellness exam.  Preventive Care 65 Years and Older, Male Preventive care refers to lifestyle choices and visits with your health care provider that can promote health and wellness. What does preventive care include?  A yearly physical exam. This is also called an annual well check.  Dental exams once or twice a year.  Routine eye exams. Ask your health care provider how often you should have your eyes checked.  Personal lifestyle choices, including:  Daily care of your teeth and gums.  Regular physical activity.  Eating a healthy diet.  Avoiding tobacco and drug use.  Limiting alcohol use.  Practicing safe sex.  Taking low doses of aspirin every day.  Taking vitamin and mineral supplements as recommended by your health care provider. What happens during an annual well check? The services and screenings done by your health care provider during your annual well check will depend on your age, overall health, lifestyle risk factors, and family history of disease. Counseling  Your health care  provider may ask you questions about your:  Alcohol use.  Tobacco use.  Drug use.  Emotional well-being.  Home and relationship well-being.  Sexual activity.  Eating habits.  History of falls.  Memory and ability to understand (cognition).  Work and work Statistician. Screening  You may have the following tests or measurements:  Height, weight, and BMI.  Blood pressure.  Lipid and cholesterol levels. These may be checked every 5 years, or more frequently if you are over 40 years old.  Skin check.  Lung cancer screening. You may have this screening every year starting at age 8 if you have a 30-pack-year history of smoking and currently smoke or have quit within the past 15 years.  Fecal occult blood test (FOBT) of the stool. You may have this test every year starting at age 81.  Flexible sigmoidoscopy or colonoscopy. You may have a sigmoidoscopy every 5 years or a colonoscopy every 10 years starting at age 64.  Prostate cancer screening. Recommendations will vary depending on your family history and other risks.  Hepatitis C blood test.  Hepatitis B blood test.  Sexually transmitted disease (STD) testing.  Diabetes screening. This is done by checking your blood sugar (glucose) after you have not eaten for a while (fasting). You may have this done every 1-3 years.  Abdominal aortic aneurysm (AAA) screening. You may need this if you are a current or former smoker.  Osteoporosis. You may be screened starting at age 38 if you are at high risk. Talk with your health care provider about your test results, treatment options, and if necessary, the need for more tests. Vaccines  Your health  care provider may recommend certain vaccines, such as:  Influenza vaccine. This is recommended every year.  Tetanus, diphtheria, and acellular pertussis (Tdap, Td) vaccine. You may need a Td booster every 10 years.  Zoster vaccine. You may need this after age 66.  Pneumococcal  13-valent conjugate (PCV13) vaccine. One dose is recommended after age 28.  Pneumococcal polysaccharide (PPSV23) vaccine. One dose is recommended after age 60. Talk to your health care provider about which screenings and vaccines you need and how often you need them. This information is not intended to replace advice given to you by your health care provider. Make sure you discuss any questions you have with your health care provider. Document Released: 01/03/2016 Document Revised: 08/26/2016 Document Reviewed: 10/08/2015 Elsevier Interactive Patient Education  2017 [place] [employer] Prevention in the Home Falls can cause injuries. They can happen to people of all ages. There are many things you can do to make your home safe and to help prevent falls. What can I do on the outside of my home?  Regularly fix the edges of walkways and driveways and fix any cracks.  Remove anything that might make you trip as you walk through a door, such as a raised step or threshold.  Trim any bushes or trees on the path to your home.  Use bright outdoor lighting.  Clear any walking paths of anything that might make someone trip, such as rocks or tools.  Regularly check to see if handrails are loose or broken. Make sure that both sides of any steps have handrails.  Any raised decks and porches should have guardrails on the edges.  Have any leaves, snow, or ice cleared regularly.  Use sand or salt on walking paths during winter.  Clean up any spills in your garage right away. This includes oil or grease spills. What can I do in the bathroom?  Use night lights.  Install grab bars by the toilet and in the tub and shower. Do not use towel bars as grab bars.  Use non-skid mats or decals in the tub or shower.  If you need to sit down in the shower, use a plastic, non-slip stool.  Keep the floor dry. Clean up any water that spills on the floor as soon as it happens.  Remove soap buildup in the  tub or shower regularly.  Attach bath mats securely with double-sided non-slip rug tape.  Do not have throw rugs and other things on the floor that can make you trip. What can I do in the bedroom?  Use night lights.  Make sure that you have a light by your bed that is easy to reach.  Do not use any sheets or blankets that are too big for your bed. They should not hang down onto the floor.  Have a firm chair that has side arms. You can use this for support while you get dressed.  Do not have throw rugs and other things on the floor that can make you trip. What can I do in the kitchen?  Clean up any spills right away.  Avoid walking on wet floors.  Keep items that you use a lot in easy-to-reach places.  If you need to reach something above you, use a strong step stool that has a grab bar.  Keep electrical cords out of the way.  Do not use floor polish or wax that makes floors slippery. If you must use wax, use non-skid floor wax.  Do not have  throw rugs and other things on the floor that can make you trip. What can I do with my stairs?  Do not leave any items on the stairs.  Make sure that there are handrails on both sides of the stairs and use them. Fix handrails that are broken or loose. Make sure that handrails are as long as the stairways.  Check any carpeting to make sure that it is firmly attached to the stairs. Fix any carpet that is loose or worn.  Avoid having throw rugs at the top or bottom of the stairs. If you do have throw rugs, attach them to the floor with carpet tape.  Make sure that you have a light switch at the top of the stairs and the bottom of the stairs. If you do not have them, ask someone to add them for you. What else can I do to help prevent falls?  Wear shoes that:  Do not have high heels.  Have rubber bottoms.  Are comfortable and fit you well.  Are closed at the toe. Do not wear sandals.  If you use a stepladder:  Make sure that it  is fully opened. Do not climb a closed stepladder.  Make sure that both sides of the stepladder are locked into place.  Ask someone to hold it for you, if possible.  Clearly mark and make sure that you can see:  Any grab bars or handrails.  First and last steps.  Where the edge of each step is.  Use tools that help you move around (mobility aids) if they are needed. These include:  Canes.  Walkers.  Scooters.  Crutches.  Turn on the lights when you go into a dark area. Replace any light bulbs as soon as they burn out.  Set up your furniture so you have a clear path. Avoid moving your furniture around.  If any of your floors are uneven, fix them.  If there are any pets around you, be aware of where they are.  Review your medicines with your doctor. Some medicines can make you feel dizzy. This can increase your chance of falling. Ask your doctor what other things that you can do to help prevent falls. This information is not intended to replace advice given to you by your health care provider. Make sure you discuss any questions you have with your health care provider. Document Released: 10/03/2009 Document Revised: 05/14/2016 Document Reviewed: 01/11/2015 Elsevier Interactive Patient Education  2017 [employer].

## 2018-02-01 NOTE — Progress Notes (Signed)
Subjective:   Andrew Benson is a 72 y.o. male who presents for Medicare Annual/Subsequent preventive examination.  Review of Systems:   Cardiac Risk Factors include: male gender;hypertension;dyslipidemia;advanced age (>70men, >82 women)     Objective:    Vitals: BP 136/78 (BP Location: Left Arm, Patient Position: Sitting)   Pulse 70   Temp 97.9 F (36.6 C) (Oral)   Resp 16   Ht 5\' 11"  (1.803 m)   Wt 188 lb 12.8 oz (85.6 kg)   BMI 26.33 kg/m   Body mass index is 26.33 kg/m.  Advanced Directives 02/01/2018 12/29/2017 11/07/2015  Does Patient Have a Medical Advance Directive? Yes Yes Yes  Type of Paramedic of [place];Living will Living will;Healthcare Power of [place];Living will  Copy of [place] in Chart? No - copy requested No - copy requested No - copy requested    Tobacco Social History   Tobacco Use  Smoking Status Former Smoker  . Packs/day: 1.00  . Years: 20.00  . Pack years: 20.00  . Types: Cigarettes  . Last attempt to quit: 02/01/1993  . Years since quitting: 25.0  Smokeless Tobacco Never Used     Counseling given: Not Answered   Clinical Intake:  Pre-visit preparation completed: Yes  Pain : No/denies pain     Nutritional Status: BMI 25 -29 Overweight Nutritional Risks: None Diabetes: No  How often do you need to have someone help you when you read instructions, pamphlets, or other written materials from your doctor or pharmacy?: 1 - Never What is the last grade level you completed in school?: College-[employer]  Interpreter Needed?: No  Information entered by :: Ziomara Birenbaum,LPN  Past Medical History:  Diagnosis Date  . Asthma   . Essential hypertension   . Hyperlipidemia   . PAF (paroxysmal atrial fibrillation) (HCC)    a. CHA2DS2VASc = 2;  b. takes amio/eliquis/metoprolol prn for PAF.   Past Surgical History:  Procedure Laterality Date  . COLONOSCOPY WITH PROPOFOL  N/A 12/29/2017   Procedure: COLONOSCOPY WITH PROPOFOL;  Surgeon: Manya Silvas, MD;  Location: [facility] ENDOSCOPY;  Service: Endoscopy;  Laterality: N/A;  . FINGER SURGERY     left index finger  . HERNIA REPAIR    . INGUINAL HERNIA REPAIR    . KNEE SURGERY     left    Family History  Problem Relation Age of Onset  . Hyperlipidemia Mother   . Hypertension Mother   . Heart disease Mother   . Heart disease Sister        valve repair   . Diabetes Brother   . Hypertension Brother    Social History   Socioeconomic History  . Marital status: Married    Spouse name: None  . Number of children: None  . Years of education: None  . Highest education level: None  Social Needs  . Financial resource strain: Not hard at all  . Food insecurity - worry: Never true  . Food insecurity - inability: Never true  . Transportation needs - medical: No  . Transportation needs - non-medical: No  Occupational History  . None  Tobacco Use  . Smoking status: Former Smoker    Packs/day: 1.00    Years: 20.00    Pack years: 20.00    Types: Cigarettes    Last attempt to quit: 02/01/1993    Years since quitting: 25.0  . Smokeless tobacco: Never Used  Substance and Sexual Activity  .  Alcohol use: Yes    Alcohol/week: 0.0 oz    Comment: social drinker.  . Drug use: No  . Sexual activity: None  Other Topics Concern  . None  Social History Narrative  . None    Outpatient Encounter Medications as of 02/01/2018  Medication Sig  . amiodarone (PACERONE) 200 MG tablet Take 1 tablet (200 mg total) by mouth 2 (two) times daily as needed (for afib).  Marland Kitchen apixaban (ELIQUIS) 5 MG TABS tablet Take 1 tablet (5 mg total) by mouth 2 (two) times daily.  Marland Kitchen aspirin EC 81 MG tablet Take 81 mg by mouth daily.   Marland Kitchen Co-Enzyme Q-10 30 MG CAPS Take 200 mg by mouth.   Marland Kitchen lisinopril (PRINIVIL,ZESTRIL) 2.5 MG tablet Take 2.5 mg by mouth daily.  . metoprolol tartrate (LOPRESSOR) 25 MG tablet Take 1 tablet (25 mg total) by  mouth 2 (two) times daily as needed (for afib).  . Multiple Vitamin (MULTIVITAMIN) capsule Take 1 capsule by mouth daily.  . rosuvastatin (CRESTOR) 20 MG tablet Take 1 tablet (20 mg total) by mouth daily.   No facility-administered encounter medications on file as of 02/01/2018.     Activities of Daily Living In your present state of health, do you have any difficulty performing the following activities: 02/01/2018 03/09/2017  Hearing? N N  Vision? N N  Difficulty concentrating or making decisions? N N  Walking or climbing stairs? N N  Dressing or bathing? N N  Doing errands, shopping? N N  Preparing Food and eating ? N -  Using the Toilet? N -  In the past six months, have you accidently leaked urine? N -  Do you have problems with loss of bowel control? N -  Managing your Medications? N -  Managing your Finances? N -  Housekeeping or managing your Housekeeping? N -  Some recent data might be hidden    Patient Care Team: Olin Hauser, DO as PCP - General (Family Medicine) Minna Merritts, MD as Consulting Physician (Cardiology)   Assessment:   This is a routine wellness examination for [name].  Exercise Activities and Dietary recommendations Current Exercise Habits: Structured exercise class, Type of exercise: treadmill;strength training/weights, Frequency (Times/Week): 2, Intensity: Mild, Exercise limited by: None identified  Goals    . DIET - INCREASE WATER INTAKE     Recommend drinking at least 6-8 glasses of water a day        Fall Risk Fall Risk  02/01/2018 12/20/2017 10/13/2016 08/25/2016 06/03/2015  Falls in the past year? No No No Yes No  Comment - - - [place]i Telephone Survey: data to providers prior to load -  Number falls in past yr: - - - 1 -  Comment - - - Emmi Telephone Survey Actual Response = 1 -  Injury with Fall? - - - No -   Is the patient's home free of loose throw rugs in walkways, pet beds, electrical cords, etc?   yes      Grab bars in  the bathroom? no      Handrails on the stairs?   no      Adequate lighting?   no  Timed Get Up and Go Performed: Completed in 8 seconds with no use of assistive devices, steady gait. No intervention needed at this time.   Depression Screen PHQ 2/9 Scores 02/01/2018 12/20/2017 10/13/2016 11/07/2015  PHQ - 2 Score 0 0 0 0    Cognitive Function  Immunization History  Administered Date(s) Administered  . Influenza,inj,Quad PF,6+ Mos 10/13/2016, 10/04/2017  . Influenza-Unspecified 08/28/2013  . Pneumococcal Conjugate-13 11/13/2014  . Pneumococcal Polysaccharide-23 10/16/2013  . Tdap 12/22/2007  . Zoster 07/21/2014    Qualifies for Shingles Vaccine? Discussed shingrix vaccine.   Screening Tests Health Maintenance  Topic Date Due  . TETANUS/TDAP  12/21/2017  . Hepatitis C Screening  12/24/2018 (Originally [birth year])  . COLONOSCOPY  12/30/2027  . INFLUENZA VACCINE  Completed  . PNA vac Low Risk Adult  Completed   Cancer Screenings: Lung: Low Dose CT Chest recommended if Age 55-80 years, 30 pack-year currently smoking OR have quit w/in 15years. Patient does not qualify. Colorectal: completed 12/29/2017  Additional Screenings:  Hepatitis B/HIV/Syphillis: not indicated Hepatitis C Screening: will order in future    Plan:    I have personally reviewed and addressed the Medicare Annual Wellness questionnaire and have noted the following in the patient's chart:  A. Medical and social history B. Use of alcohol, tobacco or illicit drugs  C. Current medications and supplements D. Functional ability and status E.  Nutritional status F.  Physical activity G. Advance directives H. List of other physicians I.  Hospitalizations, surgeries, and ER visits in previous 12 months J.  [place] such as hearing and vision if needed, cognitive and depression L. Referrals and appointments   In addition, I have reviewed and discussed with patient certain preventive protocols,  quality metrics, and best practice recommendations. A written personalized care plan for preventive services as well as general preventive health recommendations were provided to patient.   Signed,  Tyler Aas, LPN Nurse Health Advisor   Nurse Notes:none

## 2018-02-09 DIAGNOSIS — R972 Elevated prostate specific antigen [PSA]: Secondary | ICD-10-CM | POA: Diagnosis not present

## 2018-02-22 ENCOUNTER — Telehealth: Payer: Self-pay | Admitting: Family Medicine

## 2018-02-22 NOTE — Telephone Encounter (Signed)
Pt advised to drop urine specimen at urologist office.

## 2018-02-22 NOTE — Telephone Encounter (Signed)
Pt said his urologist wants him to do a urine sample.  Please call [phone]

## 2018-03-18 ENCOUNTER — Other Ambulatory Visit: Payer: Self-pay | Admitting: Cardiovascular Disease

## 2018-03-18 DIAGNOSIS — I10 Essential (primary) hypertension: Secondary | ICD-10-CM

## 2018-05-02 ENCOUNTER — Other Ambulatory Visit: Payer: Self-pay | Admitting: Cardiovascular Disease

## 2018-05-02 DIAGNOSIS — E785 Hyperlipidemia, unspecified: Secondary | ICD-10-CM

## 2018-05-10 DIAGNOSIS — Z880 Allergy status to penicillin: Secondary | ICD-10-CM | POA: Diagnosis not present

## 2018-05-10 DIAGNOSIS — Z7982 Long term (current) use of aspirin: Secondary | ICD-10-CM | POA: Diagnosis not present

## 2018-05-10 DIAGNOSIS — I10 Essential (primary) hypertension: Secondary | ICD-10-CM | POA: Diagnosis not present

## 2018-05-10 DIAGNOSIS — R3914 Feeling of incomplete bladder emptying: Secondary | ICD-10-CM | POA: Diagnosis not present

## 2018-05-10 DIAGNOSIS — N433 Hydrocele, unspecified: Secondary | ICD-10-CM | POA: Diagnosis not present

## 2018-05-10 DIAGNOSIS — Z7901 Long term (current) use of anticoagulants: Secondary | ICD-10-CM | POA: Diagnosis not present

## 2018-05-10 DIAGNOSIS — N480 Leukoplakia of penis: Secondary | ICD-10-CM | POA: Insufficient documentation

## 2018-05-10 DIAGNOSIS — R972 Elevated prostate specific antigen [PSA]: Secondary | ICD-10-CM | POA: Diagnosis not present

## 2018-05-10 DIAGNOSIS — E7800 Pure hypercholesterolemia, unspecified: Secondary | ICD-10-CM | POA: Diagnosis not present

## 2018-05-10 DIAGNOSIS — R339 Retention of urine, unspecified: Secondary | ICD-10-CM | POA: Diagnosis not present

## 2018-05-10 DIAGNOSIS — I4891 Unspecified atrial fibrillation: Secondary | ICD-10-CM | POA: Diagnosis not present

## 2018-05-10 DIAGNOSIS — N138 Other obstructive and reflux uropathy: Secondary | ICD-10-CM | POA: Diagnosis not present

## 2018-05-10 DIAGNOSIS — Z6824 Body mass index (BMI) 24.0-24.9, adult: Secondary | ICD-10-CM | POA: Diagnosis not present

## 2018-05-10 DIAGNOSIS — N401 Enlarged prostate with lower urinary tract symptoms: Secondary | ICD-10-CM | POA: Diagnosis not present

## 2018-06-14 ENCOUNTER — Encounter: Payer: Self-pay | Admitting: Family Medicine

## 2018-06-14 ENCOUNTER — Ambulatory Visit (INDEPENDENT_AMBULATORY_CARE_PROVIDER_SITE_OTHER): Payer: Medicare Other | Admitting: Family Medicine

## 2018-06-14 ENCOUNTER — Other Ambulatory Visit: Payer: Self-pay | Admitting: Family Medicine

## 2018-06-14 VITALS — BP 113/73 | HR 66 | Temp 98.5°F | Resp 16 | Ht 71.0 in | Wt 178.8 lb

## 2018-06-14 DIAGNOSIS — R7309 Other abnormal glucose: Secondary | ICD-10-CM

## 2018-06-14 DIAGNOSIS — I10 Essential (primary) hypertension: Secondary | ICD-10-CM

## 2018-06-14 DIAGNOSIS — N182 Chronic kidney disease, stage 2 (mild): Secondary | ICD-10-CM

## 2018-06-14 DIAGNOSIS — E782 Mixed hyperlipidemia: Secondary | ICD-10-CM

## 2018-06-14 DIAGNOSIS — I480 Paroxysmal atrial fibrillation: Secondary | ICD-10-CM

## 2018-06-14 DIAGNOSIS — E038 Other specified hypothyroidism: Secondary | ICD-10-CM | POA: Diagnosis not present

## 2018-06-14 DIAGNOSIS — N138 Other obstructive and reflux uropathy: Secondary | ICD-10-CM

## 2018-06-14 DIAGNOSIS — N401 Enlarged prostate with lower urinary tract symptoms: Secondary | ICD-10-CM

## 2018-06-14 DIAGNOSIS — Z7901 Long term (current) use of anticoagulants: Secondary | ICD-10-CM

## 2018-06-14 NOTE — Assessment & Plan Note (Signed)
Remains clinically stable off levothyroxine Previously followed by [facility] Endocrinology Dr Gabriel Carina Do not see recent office visit or lab result for TSH Will add thyroid studies to 12/2018 labs

## 2018-06-14 NOTE — Assessment & Plan Note (Signed)
Controlled cholesterol on statin and lifestyle Last lipid panel 12/2017  Plan: 1. Continue current meds - Rosuvastatin 20mg  daily 2. Continue ASA 81mg  for primary ASCVD risk reduction 3. Encourage improved lifestyle - low carb/cholesterol, reduce portion size, continue improving regular exercise 4. Follow-up 6 months fasting labs

## 2018-06-14 NOTE — Progress Notes (Signed)
Subjective:    Patient ID: Andrew Benson, male    DOB: 1946-07-21, 72 y.o.   MRN: [id number]  Andrew Benson is a 72 y.o. male presenting on 06/14/2018 for Hypertension   HPI   Specialists: Cardiology - Dr Ida Rogue ([place]) Urology - Dr Edrick Oh [facility] Urology)  CHRONIC HTN / Paroxysmal Atrial Fibrillation on anticoagulation - Last visit with Cardiology Dr Rockey Situ 12/2017. Interval update, recently only used Amiodarone and Metoprolol PRN x 1 dose in past 6 months, he thinks did not eat that AM and had some coffee, thinks this was related. - Has OTC BP Cuff monitor [employer], checks occasional - Lifestyle = Lost 10 lbs in >6 months, he does snack at night still, he is more active though No new concerns Current Meds - Lisinopril 2.5mg  daily and has Metoprolol 25mg  BID PRN ONLY Afib - Remains on Eliquis 5mg  BID from Cards, instead of PRN use in past - taking ASA 81mg  daily Reports good compliance, took meds today. Tolerating well, w/o complaints. Denies CP, dyspnea, HA, edema, dizziness / lightheadedness, bleeding problem or bruising   HYPERLIPIDEMIA: - Reports no concerns. Last lipid panel 12/2017, controlled  - Currently taking Rosuvastatin 20mg  daily, tolerating well without side effects or myalgias - On ASA 81 - previously took CoQ10 from pharmacy rec to get more energy he did not notice difference, did not have myalgias on statin and asking if he can stop this now - Also he took Fish Oil omega 3 OTC for while, he is not sure if it is helping, or if he needs, asking about this as well  CKD-II - Reports chronic history of CKD. His prior labs date back to 2014 in chart, stable Cr 1.2 to 1.4 in past. - He has no new concern. He agrees to follow-up blood at next visit in 6 months for annual - Not followed by Nephrology  History of Central Hypothyroidism - Followed by [facility] Endocrinology Dr Gabriel Carina, last seen 04/2017. He had Low TSH, Low T3, and  Low-normal T4, and low-normal update on thyroid scan. Other hormones pituitary normal. He was taken off of low dose Levothyroxine. - Today no new concerns, remains off levothyroxine, has not scheduled w/ Endocrinology  Elevated PSA / BPH Followed by Dr Edrick Oh [facility]. Prior PSA history elevated in 4 range, has been stable. He is followed q 6 months as planned. No new concerns from Urology.  Health Maintenance: Due for routine Hepatitis C screening. He is not sure if he wants to proceed. He declines this again for the future. States he is low risk, despite counseling on risk/benefit of screening  Due for TDap - declines at this time, no injury or laceration/abrasion. Will defer. Last done 2009  Colon CA Screening: Last Colonoscopy 12/29/17, done by Dr Rolly Salter GI, had multiple polyps tubular adenoma and other benign non high grade polyps, advised he is next due in 3 years. No known family history of colon CA. Due for screening test   Depression screen [facility] 2/9 06/14/2018 02/01/2018 12/20/2017  Decreased Interest 0 0 0  Down, Depressed, Hopeless 0 0 0  PHQ - 2 Score 0 0 0    Social History   Tobacco Use  . Smoking status: Former Smoker    Packs/day: 1.00    Years: 20.00    Pack years: 20.00    Types: Cigarettes    Last attempt to quit: 02/01/1993    Years since quitting: 25.3  .  Smokeless tobacco: Never Used  Substance Use Topics  . Alcohol use: Yes    Alcohol/week: 0.0 oz    Comment: social drinker.  . Drug use: No     Review of Systems Per HPI unless specifically indicated above     Objective:    BP 113/73   Pulse 66   Temp 98.5 F (36.9 C) (Oral)   Resp 16   Ht 5\' 11"  (1.803 m)   Wt 178 lb 12.8 oz (81.1 kg)   BMI 24.94 kg/m   Wt Readings from Last 3 Encounters:  06/14/18 178 lb 12.8 oz (81.1 kg)  02/01/18 188 lb 12.8 oz (85.6 kg)  01/18/18 187 lb (84.8 kg)    Physical Exam  Constitutional: He is oriented to person, place, and time. He  appears well-developed and well-nourished. No distress.  Well-appearing, comfortable, cooperative  HENT:  Head: Normocephalic and atraumatic.  Mouth/Throat: Oropharynx is clear and moist.  Eyes: Conjunctivae are normal. Right eye exhibits no discharge. Left eye exhibits no discharge.  Neck: Normal range of motion. Neck supple. No thyromegaly present.  Cardiovascular: Normal rate, regular rhythm, normal heart sounds and intact distal pulses.  No murmur heard. Pulmonary/Chest: Effort normal and breath sounds normal. No respiratory distress. He has no wheezes. He has no rales.  Musculoskeletal: Normal range of motion. He exhibits no edema.  Lymphadenopathy:    He has no cervical adenopathy.  Neurological: He is alert and oriented to person, place, and time.  Skin: Skin is warm and dry. No rash noted. He is not diaphoretic. No erythema.  Psychiatric: He has a normal mood and affect. His behavior is normal.  Well groomed, good eye contact, normal speech and thoughts  Nursing note and vitals reviewed.  Results for orders placed or performed during the hospital encounter of 12/29/17  Surgical pathology  Result Value Ref Range   SURGICAL PATHOLOGY      Surgical Pathology CASE: [id number] PATIENT: Andrew Benson Surgical Pathology Report     SPECIMEN SUBMITTED: A. Colon polyp x2, cecum; cold snare and cbx B. Colon polyp, ileocecal valve; hot snare and cbx C. Colon polyp, hepatic flexure; cbx D. Colon polyp, transverse; hot snare  CLINICAL HISTORY: None provided  PRE-OPERATIVE DIAGNOSIS: Personal HX of colon polyps  POST-OPERATIVE DIAGNOSIS: Polyps and internal hemorrhoids     DIAGNOSIS: A. COLON POLYP X 2, CECUM; COLD SNARE AND COLD BIOPSY: - TUBULAR ADENOMA, ONE FRAGMENT, NEGATIVE FOR HIGH-GRADE DYSPLASIA AND MALIGNANCY. - UNREMARKABLE MUCOSA, ONE FRAGMENT.  B. COLON POLYP, ILEOCECAL VALVE; HOT SNARE AND COLD BIOPSY: - ILEAL-TYPE MUCOSA WITH PROMINENT LYMPHOID  FOLLICLES. - NEGATIVE FOR DYSPLASIA AND MALIGNANCY.  C. COLON POLYP, HEPATIC FLEXURE; COLD BIOPSY: - TUBULAR ADENOMA. - NEGATIVE FOR HIGH-GRADE DYSPLASIA AND MALIGNANCY.  D. COLON POLYP, TRANSVERSE; HOT SNARE: - TUBULAR ADENOMA. - NE GATIVE FOR HIGH-GRADE DYSPLASIA AND MALIGNANCY.   GROSS DESCRIPTION: A. Labeled: cold snare and C BX cecum polyps 2 Tissue fragment(s): 2 Size: 0.2 and 0.4 cm Description: in formalin, pink-tan fragments  Entirely submitted in 1 cassette(s).   B. Labeled: hot snare and C BX ileocecal valve polyp Tissue fragment(s): multiple Size: aggregate, 1.4 x 0.5 x 0.2 cm Description: in formalin, red to tan tissue fragments and fecal material, marked blue  Entirely submitted in 1 cassette(s).   C. Labeled: C BX hepatic flexure polyp Tissue fragment(s): 2 Size: 0.2 and 0.4 cm Description: in formalin, pink-tan fragments  Entirely submitted in 1 cassette(s).   D. Labeled: hot snare transverse colon polyp  Tissue fragment(s): 1 tissue fragment Size: 0.8 x 0.3 x 0.1 cm Description:  in formalin, tan polypoid fragment inked blue at the base and a small amount of fecal material  Entirely submitted in 1 cassette(s).  Final Diagnosis performed by Bryan Lemma, MD.  Elect ronically signed 12/30/2017 6:28:54PM    The electronic signature indicates that the named Attending Pathologist has evaluated the specimen  Technical component performed at [facility], [address], [place], [place] [address] Lab: [phone] Dir: Rush Farmer, MD, MMM  Professional component performed at [facility], [facility], [place], [place], [place] [address] Lab: [phone] Dir: Dellia Nims. Reuel Derby, MD        Assessment & Plan:   Problem List Items Addressed This Visit    Central hypothyroidism    Remains clinically stable off levothyroxine Previously followed by [facility] Endocrinology Dr Gabriel Carina Do not see recent office visit or lab result for  TSH Will add thyroid studies to 12/2018 labs      CKD (chronic kidney disease), stage II    Improved on last check 12/2017 Now stable CKD-II with GFR >60. Relatively stable in >4 years Avoid NSAIDs Improve Hydration In future can monitor urine microalbumin. Already on low dose ACEi Follow-up 6 months labs      Essential hypertension - Primary    Well-controlled HTN - Home BP readings stable  Complication CKD-II, PAFib, on anticoag    Plan:  1. Continue current BP regimen - Lisinopril 2.5mg  for renal protection 2. Encourage improved lifestyle - low sodium diet, regular exercise 3. Continue monitor BP outside office, bring readings to next visit, if persistently >140/90 or new symptoms notify office sooner 4. Follow-up q 6 months for annual phys and labs      Relevant Medications   sildenafil (REVATIO) 20 MG tablet   Hyperlipidemia    Controlled cholesterol on statin and lifestyle Last lipid panel 12/2017  Plan: 1. Continue current meds - Rosuvastatin 20mg  daily 2. Continue ASA 81mg  for primary ASCVD risk reduction 3. Encourage improved lifestyle - low carb/cholesterol, reduce portion size, continue improving regular exercise 4. Follow-up 6 months fasting labs      Relevant Medications   sildenafil (REVATIO) 20 MG tablet   Paroxysmal atrial fibrillation (HCC)    Stable, controlled Paroxysmal Atrial Fibrillation, >25 yr Followed by Cardiology Dr Rockey Situ Has Amio/Metop PRN only rare occurrence - x 1 use in past 6 months On daily anticoagulation Eliquis 5mg  BID since 10/2017, continues on ASA 81 - Advised patient to further discussion long term anticoagulation with Cardiology again in future, he has concerns on both meds but understands and agrees to continue for now      Relevant Medications   sildenafil (REVATIO) 20 MG tablet      No orders of the defined types were placed in this encounter.   Follow up plan: Return in about 6 months (around 12/22/2018) for Yearly  Medicare Checkup.  Future labs ordered for 12/2018  [name], [facility] 06/14/2018, 1:28 PM

## 2018-06-14 NOTE — Assessment & Plan Note (Signed)
Well-controlled HTN - Home BP readings stable  Complication CKD-II, PAFib, on anticoag    Plan:  1. Continue current BP regimen - Lisinopril 2.5mg  for renal protection 2. Encourage improved lifestyle - low sodium diet, regular exercise 3. Continue monitor BP outside office, bring readings to next visit, if persistently >140/90 or new symptoms notify office sooner 4. Follow-up q 6 months for annual phys and labs

## 2018-06-14 NOTE — Assessment & Plan Note (Signed)
Stable, controlled Paroxysmal Atrial Fibrillation, >25 yr Followed by Cardiology Dr Rockey Situ Has Amio/Metop PRN only rare occurrence - x 1 use in past 6 months On daily anticoagulation Eliquis 5mg  BID since 10/2017, continues on ASA 81 - Advised patient to further discussion long term anticoagulation with Cardiology again in future, he has concerns on both meds but understands and agrees to continue for now

## 2018-06-14 NOTE — Patient Instructions (Addendum)
Thank you for coming to the office today.  Keep up the great work! No changes to medications.  DUE for FASTING BLOOD WORK (no food or drink after midnight before the lab appointment, only water or coffee without cream/sugar on the morning of)  SCHEDULE "Lab Only" visit in the morning at the clinic for lab draw in 6 MONTHS   - Make sure Lab Only appointment is at about 1 week before your next appointment, so that results will be available  For Lab Results, once available within 2-3 days of blood draw, you can can log in to MyChart online to view your results and a brief explanation. Also, we can discuss results at next follow-up visit.  Please schedule a Follow-up Appointment to: Return in about 6 months (around 12/22/2018) for Yearly Medicare Checkup.  If you have any other questions or concerns, please feel free to call the office or send a message through [place]. You may also schedule an earlier appointment if necessary.  Additionally, you may be receiving a survey about your experience at our office within a few days to 1 week by e-mail or mail. We value your feedback.  [name], DO [place]

## 2018-06-14 NOTE — Assessment & Plan Note (Signed)
Improved on last check 12/2017 Now stable CKD-II with GFR >60. Relatively stable in >4 years Avoid NSAIDs Improve Hydration In future can monitor urine microalbumin. Already on low dose ACEi Follow-up 6 months labs

## 2018-06-16 ENCOUNTER — Other Ambulatory Visit: Payer: Self-pay | Admitting: Cardiovascular Disease

## 2018-06-17 NOTE — Telephone Encounter (Signed)
Please review for refill, Thanks !  

## 2018-08-12 DIAGNOSIS — R972 Elevated prostate specific antigen [PSA]: Secondary | ICD-10-CM | POA: Diagnosis not present

## 2018-09-20 ENCOUNTER — Ambulatory Visit (INDEPENDENT_AMBULATORY_CARE_PROVIDER_SITE_OTHER): Payer: Medicare Other

## 2018-09-20 DIAGNOSIS — Z23 Encounter for immunization: Secondary | ICD-10-CM

## 2018-10-04 DIAGNOSIS — D485 Neoplasm of uncertain behavior of skin: Secondary | ICD-10-CM | POA: Diagnosis not present

## 2018-10-04 DIAGNOSIS — L720 Epidermal cyst: Secondary | ICD-10-CM | POA: Diagnosis not present

## 2018-10-24 DIAGNOSIS — J45909 Unspecified asthma, uncomplicated: Secondary | ICD-10-CM | POA: Diagnosis not present

## 2018-10-24 DIAGNOSIS — Z87891 Personal history of nicotine dependence: Secondary | ICD-10-CM | POA: Diagnosis not present

## 2018-10-24 DIAGNOSIS — R972 Elevated prostate specific antigen [PSA]: Secondary | ICD-10-CM | POA: Diagnosis not present

## 2018-10-24 DIAGNOSIS — N529 Male erectile dysfunction, unspecified: Secondary | ICD-10-CM | POA: Diagnosis not present

## 2018-10-24 DIAGNOSIS — I4891 Unspecified atrial fibrillation: Secondary | ICD-10-CM | POA: Diagnosis not present

## 2018-10-24 DIAGNOSIS — R3914 Feeling of incomplete bladder emptying: Secondary | ICD-10-CM | POA: Diagnosis not present

## 2018-10-24 DIAGNOSIS — I10 Essential (primary) hypertension: Secondary | ICD-10-CM | POA: Diagnosis not present

## 2018-10-24 DIAGNOSIS — E7800 Pure hypercholesterolemia, unspecified: Secondary | ICD-10-CM | POA: Diagnosis not present

## 2018-10-24 DIAGNOSIS — Z6824 Body mass index (BMI) 24.0-24.9, adult: Secondary | ICD-10-CM | POA: Diagnosis not present

## 2018-11-30 ENCOUNTER — Telehealth: Payer: Self-pay | Admitting: Family Medicine

## 2018-11-30 NOTE — Telephone Encounter (Signed)
LM to resched AWV c/b [phone] °Kathryn Brown °Care Guide ° ° °

## 2018-12-15 NOTE — Telephone Encounter (Signed)
3rd attempt

## 2018-12-22 ENCOUNTER — Ambulatory Visit: Payer: Self-pay | Admitting: Family Medicine

## 2018-12-29 ENCOUNTER — Encounter: Payer: Self-pay | Admitting: Family Medicine

## 2018-12-29 ENCOUNTER — Ambulatory Visit (INDEPENDENT_AMBULATORY_CARE_PROVIDER_SITE_OTHER): Payer: Medicare Other | Admitting: Family Medicine

## 2018-12-29 VITALS — BP 138/84 | HR 73 | Temp 98.9°F | Resp 16 | Ht 71.0 in | Wt 189.0 lb

## 2018-12-29 DIAGNOSIS — R6889 Other general symptoms and signs: Secondary | ICD-10-CM | POA: Diagnosis not present

## 2018-12-29 DIAGNOSIS — J0100 Acute maxillary sinusitis, unspecified: Secondary | ICD-10-CM

## 2018-12-29 DIAGNOSIS — R509 Fever, unspecified: Secondary | ICD-10-CM | POA: Diagnosis not present

## 2018-12-29 LAB — POCT INFLUENZA A/B
INFLUENZA A, POC: NEGATIVE
INFLUENZA B, POC: NEGATIVE

## 2018-12-29 MED ORDER — IPRATROPIUM BROMIDE 0.06 % NA SOLN: 2.0000 | Freq: Four times a day (QID) | NASAL | 0 refills | Status: DC

## 2018-12-29 MED ORDER — BENZONATATE 100 MG PO CAPS: 100.0000 mg | ORAL_CAPSULE | Freq: Three times a day (TID) | ORAL | 0 refills | Status: DC | PRN

## 2018-12-29 MED ORDER — LEVOFLOXACIN 500 MG PO TABS
500.0000 mg | ORAL_TABLET | Freq: Every day | ORAL | 0 refills | Status: DC
Start: 2018-12-29 — End: 2019-01-12

## 2018-12-29 NOTE — Progress Notes (Signed)
Subjective:    Patient ID: Andrew Benson, male    DOB: 1946/09/21, 73 y.o.   MRN: [id number]  Andrew Benson is a 73 y.o. male presenting on 12/29/2018 for Nasal Congestion (onset week chills denies fever or bodyache, cough, throat was sore past week improved now)  Patient presents for a same day appointment.  HPI   SINUSITIS / FLU-LIKE SYMPTOMS / CHILLS Reports symptoms started 2-3 weeks ago with congestion sore throat URI symptoms, there were some sick contacts at home, he had some chest congestion but difficulty clearing it up, he took Airborne vitamin C supplement and Echinacea, and eventually seemed to improve. He had sick contact at home granddaughter >1 week ago was reportedly diagnosed with the flu and treated. Now his symptoms have worsened in past week seemed to "get sick again", he still has sinus congestion and some chest congestion occasionally productive. He is worried about travel out of state to [place] to visit family on Monday. - He tried Mucinex 12 hr - it helped clear some chest congestion but he stopped. He considered Sudafed but was advised to not use this one due to Eliquis. - Admitted episodes of fever and chills last night - Admits tired and reduced energy - Denies nausea vomiting, abdominal pain, body aches  Health Maintenance: UTD 09/20/18  Depression screen [facility] 2/9 12/29/2018 06/14/2018 02/01/2018  Decreased Interest 0 0 0  Down, Depressed, Hopeless 0 0 0  PHQ - 2 Score 0 0 0    Social History   Tobacco Use  . Smoking status: Former Smoker    Packs/day: 1.00    Years: 20.00    Pack years: 20.00    Types: Cigarettes    Last attempt to quit: 02/01/1993    Years since quitting: 25.9  . Smokeless tobacco: Former Network engineer Use Topics  . Alcohol use: Yes    Alcohol/week: 0.0 standard drinks    Comment: social drinker.  . Drug use: No    Review of Systems Per HPI unless specifically indicated above     Objective:    BP 138/84   Pulse 73   Temp  98.9 F (37.2 C) (Oral)   Resp 16   Ht 5\' 11"  (1.803 m)   Wt 189 lb (85.7 kg)   SpO2 100%   BMI 26.36 kg/m   Wt Readings from Last 3 Encounters:  12/29/18 189 lb (85.7 kg)  06/14/18 178 lb 12.8 oz (81.1 kg)  02/01/18 188 lb 12.8 oz (85.6 kg)    Physical Exam Vitals signs and nursing note reviewed.  Constitutional:      General: He is not in acute distress.    Appearance: He is well-developed. He is not diaphoretic.     Comments: Well-appearing, comfortable, cooperative  HENT:     Head: Normocephalic and atraumatic.     Comments: Frontal / maxillary sinuses non-tender. Nares patent with some deeper congestion without purulence. Bilateral TMs clear with some mild effusion L>R without erythema or bulging. Oropharynx mostly clear with some post nasal drainage without erythema, exudates, edema or asymmetry. Eyes:     General:        Right eye: No discharge.        Left eye: No discharge.     Conjunctiva/sclera: Conjunctivae normal.  Neck:     Musculoskeletal: Normal range of motion and neck supple.     Thyroid: No thyromegaly.  Cardiovascular:     Rate and Rhythm: Normal rate and regular rhythm.  Heart sounds: Normal heart sounds. No murmur.  Pulmonary:     Effort: Pulmonary effort is normal. No respiratory distress.     Breath sounds: No wheezing or rales.     Comments: Scattered rhonchi bilateral with slightly coarse sound. Good air movement. Speaks full sentences. No fine crackles. No wheezing. Lymphadenopathy:     Cervical: No cervical adenopathy.  Skin:    General: Skin is warm and dry.     Findings: No erythema or rash.  Neurological:     Mental Status: He is alert and oriented to person, place, and time.  Psychiatric:        Behavior: Behavior normal.     Comments: Well groomed, good eye contact, normal speech and thoughts    Results for orders placed or performed in visit on 12/29/18 (from the past 48 hour(s))  POCT Influenza A/B     Status: Normal    Collection Time: 12/29/18  9:27 AM  Result Value Ref Range   Influenza A, POC Negative Negative   Influenza B, POC Negative Negative       Assessment & Plan:   Problem List Items Addressed This Visit    None    Visit Diagnoses    Acute non-recurrent maxillary sinusitis    -  Primary   Relevant Medications   levofloxacin (LEVAQUIN) 500 MG tablet   ipratropium (ATROVENT) 0.06 % nasal spray   benzonatate (TESSALON) 100 MG capsule   Flu-like symptoms       Relevant Orders   POCT Influenza A/B (Completed)   Fever and chills       Relevant Orders   POCT Influenza A/B (Completed)      Consistent with acute maxillary sinusitis, likely initially viral URI illness >2 weeks ago then improved then second sickening with recurrent illness now sinus infection. Concern with potential exposure to influenza virus sick contact however >1 week ago, seems afebrile now had some initial temperature instability and chills.  Plan: 1. Check rapid flu test today - results show negative flu - discussed may be less accurate later in illness and also not 100% - however agreed to test given his upcoming travel / visiting family and suspicious symptoms, duration now >1 week is too late to start empiric treatment - instead clinically seems currently more suspicious for sinus infection - Start taking Levaquin antibiotic 500mg  daily x 7 days - PCN allergy - Start Atrovent nasal spray decongestant 2 sprays in each nostril up to 4 times daily for 7 days - Start Tessalon Perls take 1 capsule up to 3 times a day as needed for cough  Return criteria reviewed - if worsening out of state should go to urgent care, may warrant chest x-ray in near future - offered this today but patient declined.   Meds ordered this encounter  Medications  . levofloxacin (LEVAQUIN) 500 MG tablet    Sig: Take 1 tablet (500 mg total) by mouth daily. For 7 days    Dispense:  7 tablet    Refill:  0  . ipratropium (ATROVENT) 0.06 % nasal  spray    Sig: Place 2 sprays into both nostrils 4 (four) times daily. For up to 5-7 days then stop.    Dispense:  15 mL    Refill:  0  . benzonatate (TESSALON) 100 MG capsule    Sig: Take 1 capsule (100 mg total) by mouth 3 (three) times daily as needed for cough.    Dispense:  30 capsule  Refill:  0      Follow up plan: Return in about 1 week (around 01/05/2019), or if symptoms worsen or fail to improve, for sinusitis.   [name], DO [facility] 12/29/2018, 8:47 AM

## 2018-12-29 NOTE — Patient Instructions (Addendum)
Thank you for coming to the office today.  Flu test negative today = this may not be 100% but it is fairly accurate  1. It sounds like you have a Sinusitis (Bacterial Infection) - this most likely started as an Upper Respiratory Virus that has settled into an infection.  - Start taking Levaquin antibiotic 500mg  daily x 7 days - Start Atrovent nasal spray decongestant 2 sprays in each nostril up to 4 times daily for 7 days - Start [employer] take 1 capsule up to 3 times a day as needed for cough - Recommend to try using Nasal Saline spray multiple times a day to help flush out congestion and clear sinuses - Improve hydration by drinking plenty of clear fluids (water, gatorade) to reduce secretions and thin congestion - Congestion draining down throat can cause irritation. May try warm herbal tea with honey, cough drops - Can take Tylenol or Ibuprofen as needed for fevers - May continue over the counter cold medicine as you are, I would not use any decongestant or mucinex longer than 7 days.  If you develop persistent fever >101F for at least 3 consecutive days, headaches with sinus pain or pressure or persistent earache, please schedule a follow-up evaluation within next few days to week.  May go to Urgent Care or elsewhere if out of town, and worsening on antibiotic - may need Chest X-ray   Please schedule a Follow-up Appointment to: Return in about 1 week (around 01/05/2019), or if symptoms worsen or fail to improve, for sinusitis.  If you have any other questions or concerns, please feel free to call the office or send a message through [place]. You may also schedule an earlier appointment if necessary.  Additionally, you may be receiving a survey about your experience at our office within a few days to 1 week by e-mail or mail. We value your feedback.  [name], DO [place]

## 2019-01-03 ENCOUNTER — Ambulatory Visit: Payer: Self-pay | Admitting: Family Medicine

## 2019-01-03 ENCOUNTER — Ambulatory Visit: Payer: Self-pay

## 2019-01-12 ENCOUNTER — Ambulatory Visit (INDEPENDENT_AMBULATORY_CARE_PROVIDER_SITE_OTHER): Payer: Medicare Other | Admitting: Cardiovascular Disease

## 2019-01-12 ENCOUNTER — Encounter: Payer: Self-pay | Admitting: Cardiovascular Disease

## 2019-01-12 VITALS — BP 124/72 | HR 94 | Ht 71.0 in | Wt 184.0 lb

## 2019-01-12 DIAGNOSIS — I480 Paroxysmal atrial fibrillation: Secondary | ICD-10-CM

## 2019-01-12 MED ORDER — RIVAROXABAN 20 MG PO TABS: 20.0000 mg | ORAL_TABLET | Freq: Every day | ORAL | 11 refills | Status: DC

## 2019-01-12 NOTE — Progress Notes (Signed)
Cardiology Office Note  Date:  01/12/2019   ID:  Andrew Benson, Andrew Benson 1946-01-23, MRN [id number]  PCP:  Olin Hauser, DO   Chief Complaint  Patient presents with  . Other    12 month follow up. Patient states he would like to come off of Eliquis due to the cost. Patient denies chest pain and SOB at this time. Meds reviewed verbally with patient.     HPI:  Andrew Benson is a pleasant 73 year old gentleman with remote history of  atrial fibrillation 25 years ago,  remote history of smoking for 20 years,  Previously had atrial fibrillation while he was on his tractor in 2015, developed acute onset of palpitations, presented to the emergency room  atrial fib 10/26/13 10/31/13 NSR who presents for routine followup for atrial fibrillation.   Denies having recent episodes of atrial fibrillation Concerned about the cost of the Eliquis Went to the pharmacy was told it was going to be $400 Last year is paying $40 a month Discussed Medicare deductible with him in detail  Otherwise reports he is exercising on a regular basis, no shortness of breath or chest pain on exertion Watching his diet  On last office visit in early 2019 had been in [place] visiting family, large coffee  developed profound tachycardia rate up to 200 bpm Lasted approximately 20 minutes before terminating Towards the tail end of the arrhythmia took metoprolol possibly amiodarone  At that time reported having paroxysmal episodes of tachycardia rate up to 130s at times   chads VASC  score of 2, age, blood pressure   on Crestor 20 mg daily  Total chol 130, LDL 73  EKG personally reviewed by myself on todays visit Shows normal sinus rhythm rate 94 bpm no significant ST or T wave changes   PMH:   has a past medical history of Asthma, Essential hypertension, Hyperlipidemia, and PAF (paroxysmal atrial fibrillation) ([place]).  PSH:    Past Surgical History:  Procedure Laterality Date  . COLONOSCOPY WITH PROPOFOL  N/A 12/29/2017   Procedure: COLONOSCOPY WITH PROPOFOL;  Surgeon: Manya Silvas, MD;  Location: [facility] ENDOSCOPY;  Service: Endoscopy;  Laterality: N/A;  . FINGER SURGERY     left index finger  . HERNIA REPAIR    . INGUINAL HERNIA REPAIR    . KNEE SURGERY     left     Current Outpatient Medications  Medication Sig Dispense Refill  . aspirin EC 81 MG tablet Take 81 mg by mouth daily.     Marland Kitchen ELIQUIS 5 MG TABS tablet TAKE 1 TABLET BY MOUTH TWICE A DAY 60 tablet 11  . lisinopril (PRINIVIL,ZESTRIL) 2.5 MG tablet TAKE 1 TABLET (5 MG TOTAL) BY MOUTH DAILY. (Patient taking differently: Take 2.5 mg by mouth daily. TAKE 1 TABLET (2.5 MG TOTAL) BY MOUTH DAILY.) 90 tablet 3  . Multiple Vitamin (MULTIVITAMIN) capsule Take 1 capsule by mouth daily.    . rosuvastatin (CRESTOR) 20 MG tablet TAKE 1 TABLET (20 MG TOTAL) BY MOUTH DAILY. 90 tablet 3  . sildenafil (REVATIO) 20 MG tablet 3-5 po tablets daily as needed     No current facility-administered medications for this visit.      Allergies:   Penicillins   Social History:  The patient  reports that he quit smoking about 25 years ago. His smoking use included cigarettes. He has a 20.00 pack-year smoking history. He has quit using smokeless tobacco. He reports current alcohol use. He reports that he does not use drugs.  Family History:   family history includes Diabetes in his brother; Heart disease in his mother and sister; Hyperlipidemia in his mother; Hypertension in his brother and mother.    Review of Systems: Review of Systems  Constitutional: Negative.   Respiratory: Negative.   Cardiovascular: Positive for palpitations.  Gastrointestinal: Negative.   Musculoskeletal: Negative.   Neurological: Negative.   Psychiatric/Behavioral: Negative.   All other systems reviewed and are negative.   PHYSICAL EXAM: VS:  BP 124/72 (BP Location: Left Arm, Patient Position: Sitting, Cuff Size: Normal)   Pulse 94   Ht 5\' 11"  (1.803 m)   Wt 184 lb  (83.5 kg)   BMI 25.66 kg/m  , BMI Body mass index is 25.66 kg/m. Constitutional:  oriented to person, place, and time. No distress.  HENT:  Head: Grossly normal Eyes:  no discharge. No scleral icterus.  Neck: No JVD, no carotid bruits  Cardiovascular: Regular rate and rhythm, no murmurs appreciated Pulmonary/Chest: Clear to auscultation bilaterally, no wheezes or rails Abdominal: Soft.  no distension.  no tenderness.  Musculoskeletal: Normal range of motion Neurological:  normal muscle tone. Coordination normal. No atrophy Skin: Skin warm and dry Psychiatric: normal affect, pleasant  Recent Labs: No results found for requested labs within last 8760 hours.    Lipid Panel Lab Results  Component Value Date   CHOL 146 12/22/2017   HDL 55 12/22/2017   LDLCALC 73 12/22/2017   TRIG 95 12/22/2017      Wt Readings from Last 3 Encounters:  01/12/19 184 lb (83.5 kg)  12/29/18 189 lb (85.7 kg)  06/14/18 178 lb 12.8 oz (81.1 kg)      ASSESSMENT AND PLAN:  Paroxysmal atrial fibrillation (HCC) - Plan: EKG 12-Lead CHADS VASC of 2,  Tolerating  Eliquis 5 mg twice daily Taking amiodarone and metoprolol as needed Long discussion with him concerning anticoagulation Is indicated he is concerned about the cost Explained that he is likely paying for his deductible following which he will have a lower co-pay He is missing doses of Eliquis here and there as he likes to take his medications 1 a day Suggested he take Xarelto 20 daily and stop the Eliquis, also stop aspirin  Mixed hyperlipidemia At goal.   Stable on Crestor  Essential hypertension -  Blood pressure is well controlled on today's visit. No changes made to the medications.  CKD (chronic kidney disease), stage II Stable Monitored by primary care  Other chest pain No recent episodes of chest pain   Total encounter time more than 25 minutes  Greater than 50% was spent in counseling and coordination of care with the  patient   Disposition:   F/U  12 months   Orders Placed This Encounter  Procedures  . EKG 12-Lead     Signed, Esmond Plants, M.D., Ph.D. 01/12/2019  [place], [place]

## 2019-01-12 NOTE — Patient Instructions (Addendum)
Medication Instructions:  Start xarelto 20 mg once a day Stop aspirin Stop eliquis  If you need a refill on your cardiac medications before your next appointment, please call your pharmacy.  Medication Samples have been provided to the patient.  Drug name: Xarelto       Strength: 20 mg        Qty: 4 bottles  LOT: [id number] Exp.Date: 11/21    Lab work: No new labs needed   If you have labs (blood work) drawn today and your tests are completely normal, you will receive your results only by: Marland Kitchen MyChart Message (if you have MyChart) OR . A paper copy in the mail If you have any lab test that is abnormal or we need to change your treatment, we will call you to review the results.   Testing/Procedures: No new testing needed   Follow-Up: At [facility], you and your health needs are our priority.  As part of our continuing mission to provide you with exceptional heart care, we have created designated Provider Care Teams.  These Care Teams include your primary Cardiologist (physician) and Advanced Practice Providers (APPs -  Physician Assistants and Nurse Practitioners) who all work together to provide you with the care you need, when you need it.  . You will need a follow up appointment in 12 months .   Please call our office 2 months in advance to schedule this appointment.    . Providers on your designated Care Team:   . Murray Hodgkins, NP . Christell Faith, PA-C . Marrianne Mood, PA-C  Any Other Special Instructions Will Be Listed Below (If Applicable).  For educational health videos Log in to : [email or web address] Or : [email or web address], password : [email or web address]

## 2019-01-20 ENCOUNTER — Other Ambulatory Visit: Payer: Self-pay | Admitting: Family Medicine

## 2019-01-20 DIAGNOSIS — J0100 Acute maxillary sinusitis, unspecified: Secondary | ICD-10-CM

## 2019-01-26 DIAGNOSIS — R972 Elevated prostate specific antigen [PSA]: Secondary | ICD-10-CM | POA: Diagnosis not present

## 2019-01-26 DIAGNOSIS — N400 Enlarged prostate without lower urinary tract symptoms: Secondary | ICD-10-CM | POA: Diagnosis not present

## 2019-02-06 DIAGNOSIS — Z6825 Body mass index (BMI) 25.0-25.9, adult: Secondary | ICD-10-CM | POA: Diagnosis not present

## 2019-02-06 DIAGNOSIS — R972 Elevated prostate specific antigen [PSA]: Secondary | ICD-10-CM | POA: Diagnosis not present

## 2019-02-14 ENCOUNTER — Ambulatory Visit: Payer: Self-pay

## 2019-02-14 ENCOUNTER — Ambulatory Visit (INDEPENDENT_AMBULATORY_CARE_PROVIDER_SITE_OTHER): Payer: Medicare Other | Admitting: Family Medicine

## 2019-02-14 ENCOUNTER — Ambulatory Visit (INDEPENDENT_AMBULATORY_CARE_PROVIDER_SITE_OTHER): Payer: Medicare Other

## 2019-02-14 ENCOUNTER — Ambulatory Visit: Payer: Self-pay | Admitting: Family Medicine

## 2019-02-14 ENCOUNTER — Other Ambulatory Visit: Payer: Self-pay

## 2019-02-14 ENCOUNTER — Encounter: Payer: Self-pay | Admitting: Family Medicine

## 2019-02-14 VITALS — BP 136/72 | HR 69 | Temp 98.1°F | Resp 16 | Ht 71.0 in | Wt 186.0 lb

## 2019-02-14 DIAGNOSIS — Z1159 Encounter for screening for other viral diseases: Secondary | ICD-10-CM | POA: Diagnosis not present

## 2019-02-14 DIAGNOSIS — Z0000 Encounter for general adult medical examination without abnormal findings: Secondary | ICD-10-CM | POA: Diagnosis not present

## 2019-02-14 NOTE — Progress Notes (Signed)
   Subjective:    Patient ID: Andrew Benson, male    DOB: 1946/05/09, 74 y.o.   MRN: [id number]  Andrew Benson is a 73 y.o. male presenting on 02/14/2019 for Medicare Wellness  Patient was not evaluated by physician today. He was scheduled for Annual Medicare Wellness with physician, instead of our Nurse Health Advisor, who was available. He preferred to switch his apt to see health team advisor for AMW, and he will re-schedule physician appointment to 1 month to match the time schedule of his wife's appointment as well. No medical examination was performed by myself as provider, and no treatment given. His vitals were taken on initial intake, and these were documented by medicare wellness visit by our nurse.  HPI Review of Systems Physical Exam  Cannot delete this section of note.   Andrew Benson, [facility] 02/14/2019, 1:58 PM

## 2019-02-14 NOTE — Progress Notes (Signed)
Subjective:   Andrew Benson is a 73 y.o. male who presents for Medicare Annual/Subsequent preventive examination.  Review of Systems:   Cardiac Risk Factors include: advanced age (>34men, >9 women);male gender;hypertension;dyslipidemia     Objective:    Vitals: BP 136/72 (BP Location: Left Arm, Patient Position: Sitting, Cuff Size: Normal)   Pulse 69   Temp 98.1 F (36.7 C) (Oral)   Resp 16   Ht 5\' 11"  (1.803 m)   Wt 186 lb (84.4 kg)   BMI 25.94 kg/m   Body mass index is 25.94 kg/m.  Advanced Directives 02/14/2019 02/01/2018 12/29/2017 11/07/2015  Does Patient Have a Medical Advance Directive? Yes Yes Yes Yes  Type of Advance Directive Living will;Healthcare Power of [place];Living will Living will;Healthcare Power of [place];Living will  Copy of [place] in Chart? No - copy requested No - copy requested No - copy requested No - copy requested    Tobacco Social History   Tobacco Use  Smoking Status Former Smoker  . Packs/day: 1.00  . Years: 20.00  . Pack years: 20.00  . Types: Cigarettes  . Last attempt to quit: 02/01/1993  . Years since quitting: 26.0  Smokeless Tobacco Never Used     Counseling given: Not Answered   Clinical Intake:  Pre-visit preparation completed: Yes  Pain : No/denies pain     Nutritional Status: BMI 25 -29 Overweight Nutritional Risks: None Diabetes: No  How often do you need to have someone help you when you read instructions, pamphlets, or other written materials from your doctor or pharmacy?: 1 - Never What is the last grade level you completed in school?: as degree and some more college   Interpreter Needed?: No  Information entered by :: Andrew Ledet,LPN   Past Medical History:  Diagnosis Date  . Asthma   . Essential hypertension   . Hyperlipidemia   . PAF (paroxysmal atrial fibrillation) (HCC)    a. CHA2DS2VASc = 2;  b. takes  amio/eliquis/metoprolol prn for PAF.   Past Surgical History:  Procedure Laterality Date  . COLONOSCOPY WITH PROPOFOL N/A 12/29/2017   Procedure: COLONOSCOPY WITH PROPOFOL;  Surgeon: Manya Silvas, MD;  Location: [facility];  Service: Endoscopy;  Laterality: N/A;  . FINGER SURGERY     left index finger  . HERNIA REPAIR    . INGUINAL HERNIA REPAIR    . KNEE SURGERY     left    Family History  Problem Relation Age of Onset  . Hyperlipidemia Mother   . Hypertension Mother   . Heart disease Mother   . Heart disease Sister        valve repair   . Diabetes Brother   . Hypertension Brother    Social History   Socioeconomic History  . Marital status: Married    Spouse name: Not on file  . Number of children: Not on file  . Years of education: Not on file  . Highest education level: Associate degree: academic program  Occupational History  . Not on file  Social Needs  . Financial resource strain: Not hard at all  . Food insecurity:    Worry: Never true    Inability: Never true  . Transportation needs:    Medical: No    Non-medical: No  Tobacco Use  . Smoking status: Former Smoker    Packs/day: 1.00    Years: 20.00    Pack years: 20.00  Types: Cigarettes    Last attempt to quit: 02/01/1993    Years since quitting: 26.0  . Smokeless tobacco: Never Used  Substance and Sexual Activity  . Alcohol use: Yes    Alcohol/week: 0.0 standard drinks    Comment: social drinker.  . Drug use: No  . Sexual activity: Not on file  Lifestyle  . Physical activity:    Days per week: 2 days    Minutes per session: 60 min  . Stress: Not at all  Relationships  . Social connections:    Talks on phone: More than three times a week    Gets together: More than three times a week    Attends religious service: More than 4 times per year    Active member of club or organization: Yes    Attends meetings of clubs or organizations: More than 4 times per year    Relationship status:  Married  Other Topics Concern  . Not on file  Social History Narrative   Participates in church activities, and food bank 3 days a week   Gym 2 times weekly- treadmill     Outpatient Encounter Medications as of 02/14/2019  Medication Sig  . lisinopril (PRINIVIL,ZESTRIL) 2.5 MG tablet TAKE 1 TABLET (5 MG TOTAL) BY MOUTH DAILY. (Patient taking differently: Take 2.5 mg by mouth daily. TAKE 1 TABLET (2.5 MG TOTAL) BY MOUTH DAILY.)  . Multiple Vitamin (MULTIVITAMIN) capsule Take 1 capsule by mouth daily.  . rivaroxaban (XARELTO) 20 MG TABS tablet Take 1 tablet (20 mg total) by mouth daily with supper.  . rosuvastatin (CRESTOR) 20 MG tablet TAKE 1 TABLET (20 MG TOTAL) BY MOUTH DAILY.  . sildenafil (REVATIO) 20 MG tablet 3-5 po tablets daily as needed  . ipratropium (ATROVENT) 0.06 % nasal spray PLACE 2 SPRAYS INTO BOTH NOSTRILS 4 (FOUR) TIMES DAILY. FOR UP TO 5-7 DAYS THEN STOP. (Patient not taking: Reported on 02/14/2019)   No facility-administered encounter medications on file as of 02/14/2019.     Activities of Daily Living In your present state of health, do you have any difficulty performing the following activities: 02/14/2019  Hearing? N  Vision? N  Difficulty concentrating or making decisions? N  Walking or climbing stairs? N  Dressing or bathing? N  Doing errands, shopping? N  Preparing Food and eating ? N  Using the Toilet? N  In the past six months, have you accidently leaked urine? N  Do you have problems with loss of bowel control? N  Managing your Medications? N  Managing your Finances? N  Housekeeping or managing your Housekeeping? N  Some recent data might be hidden    Patient Care Team: Andrew Hauser, DO as PCP - General (Family Medicine) Andrew Merritts, MD as Consulting Physician (Cardiology)   Assessment:   This is a routine wellness examination for Andrew Benson.  Exercise Activities and Dietary recommendations Current Exercise Habits: Home exercise  routine, Type of exercise: treadmill, Time (Minutes): 60, Frequency (Times/Week): 2, Weekly Exercise (Minutes/Week): 120, Intensity: Mild, Exercise limited by: None identified  Goals    . DIET - INCREASE WATER INTAKE     Recommend drinking at least 6-8 glasses of water a day        Fall Risk Fall Risk  02/14/2019 12/29/2018 06/14/2018 02/01/2018 12/20/2017  Falls in the past year? 0 0 No No No  Comment - - - - -  Number falls in past yr: - - - - -  Comment - - - - -  Injury with Fall? - - - - -  Follow up Falls evaluation completed Falls evaluation completed - - -   FALL RISK PREVENTION PERTAINING TO THE HOME:  Any stairs in or around the home? No  If so, are there any without handrails? na  Home free of loose throw rugs in walkways, pet beds, electrical cords, etc? Yes  Adequate lighting in your home to reduce risk of falls? Yes   ASSISTIVE DEVICES UTILIZED TO PREVENT FALLS:  Life alert? No  Use of a cane, walker or w/c? No  Grab bars in the bathroom? No  Shower chair or bench in shower? No  Elevated toilet seat or a handicapped toilet? No   DME ORDERS:  DME order needed?  No   TIMED UP AND GO:  Was the test performed? Yes .  Length of time to ambulate 10 feet: 11 sec.   GAIT:  Appearance of gait: Gait stead-fast without the use of an assistive device. Education: Fall risk prevention has been discussed.  Intervention(s) required? No    Depression Screen PHQ 2/9 Scores 02/14/2019 12/29/2018 06/14/2018 02/01/2018  PHQ - 2 Score 0 0 0 0    Cognitive Function     6CIT Screen 02/14/2019  What Year? 0 points  What month? 0 points  What time? 0 points  Count back from 20 0 points  Months in reverse 0 points  Repeat phrase 0 points  Total Score 0    Immunization History  Administered Date(s) Administered  . Influenza,inj,Quad PF,6+ Mos 10/13/2016, 10/04/2017, 09/20/2018  . Influenza-Unspecified 08/28/2013  . Pneumococcal Conjugate-13 11/13/2014  . Pneumococcal  Polysaccharide-23 10/16/2013  . Tdap 12/22/2007  . Zoster 07/21/2014    Qualifies for Shingles Vaccine? Yes  Zostavax completed 07/21/2014. Due for Shingrix. Education has been provided regarding the importance of this vaccine. Pt has been advised to call insurance company to determine out of pocket expense. Advised may also receive vaccine at local pharmacy or Health Dept. Verbalized acceptance and understanding.  Tdap: Although this vaccine is not a covered service during a Wellness Exam, does the patient still wish to receive this vaccine today?  No .  Education has been provided regarding the importance of this vaccine. Advised may receive this vaccine at local pharmacy or Health Dept. Aware to provide a copy of the vaccination record if obtained from local pharmacy or Health Dept. Verbalized acceptance and understanding.  Flu Vaccine: up to date  Pneumococcal Vaccine: up to date   Screening Tests Health Maintenance  Topic Date Due  . TETANUS/TDAP  12/21/2017  . Hepatitis C Screening  12/22/2019 (Originally 1946-09-23)  . COLONOSCOPY  12/29/2020  . INFLUENZA VACCINE  Completed  . PNA vac Low Risk Adult  Completed   Cancer Screenings:  Colorectal Screening: Completed 12/29/2017. Repeat every 3 years    Lung Cancer Screening: (Low Dose CT Chest recommended if Age 72-80 years, 30 pack-year currently smoking OR have quit w/in 15years.) does not qualify.     Additional Screening:  Hepatitis C Screening: does qualify; ordered  Vision Screening: Recommended annual ophthalmology exams for early detection of glaucoma and other disorders of the eye. Is the patient up to date with their annual eye exam?  Yes  Who is the provider or what is the name of the office in which the pt attends annual eye exams? [facility]   Dental Screening: Recommended annual dental exams for proper oral hygiene  Community Resource Referral:  CRR required this visit?  No  Plan:    I have  personally reviewed and addressed the Medicare Annual Wellness questionnaire and have noted the following in the patient's chart:  A. Medical and social history B. Use of alcohol, tobacco or illicit drugs  C. Current medications and supplements D. Functional ability and status E.  Nutritional status F.  Physical activity G. Advance directives H. List of other physicians I.  Hospitalizations, surgeries, and ER visits in previous 12 months J.  [place] such as hearing and vision if needed, cognitive and depression L. Referrals and appointments   In addition, I have reviewed and discussed with patient certain preventive protocols, quality metrics, and best practice recommendations. A written personalized care plan for preventive services as well as general preventive health recommendations were provided to patient.   Signed,  Tyler Aas, LPN Nurse Health Advisor   Nurse Notes:none

## 2019-02-14 NOTE — Patient Instructions (Signed)
Andrew Benson , Thank you for taking time to come for your Medicare Wellness Visit. I appreciate your ongoing commitment to your health goals. Please review the following plan we discussed and let me know if I can assist you in the future.   Screening recommendations/referrals: Colonoscopy: completed 12/2017, due in 3 years  Recommended yearly ophthalmology/optometry visit for glaucoma screening and checkup Recommended yearly dental visit for hygiene and checkup  Vaccinations: Influenza vaccine: up to date  Pneumococcal vaccine: up to date  Tdap vaccine:due, check with your insurance company for coverage  Shingles vaccine: shingrix eligible, check with your insurance company for coverage   Advanced directives: Please bring a copy of your health care power of attorney and living will to the office at your convenience.  Conditions/risks identified: Recommend 6-8 glasses of water a day.   Next appointment: Follow up in one year for your annual wellness exam.   Preventive Care 65 Years and Older, Male Preventive care refers to lifestyle choices and visits with your health care provider that can promote health and wellness. What does preventive care include?  A yearly physical exam. This is also called an annual well check.  Dental exams once or twice a year.  Routine eye exams. Ask your health care provider how often you should have your eyes checked.  Personal lifestyle choices, including:  Daily care of your teeth and gums.  Regular physical activity.  Eating a healthy diet.  Avoiding tobacco and drug use.  Limiting alcohol use.  Practicing safe sex.  Taking low doses of aspirin every day.  Taking vitamin and mineral supplements as recommended by your health care provider. What happens during an annual well check? The services and screenings done by your health care provider during your annual well check will depend on your age, overall health, lifestyle risk factors, and  family history of disease. Counseling  Your health care provider may ask you questions about your:  Alcohol use.  Tobacco use.  Drug use.  Emotional well-being.  Home and relationship well-being.  Sexual activity.  Eating habits.  History of falls.  Memory and ability to understand (cognition).  Work and work Statistician. Screening  You may have the following tests or measurements:  Height, weight, and BMI.  Blood pressure.  Lipid and cholesterol levels. These may be checked every 5 years, or more frequently if you are over 47 years old.  Skin check.  Lung cancer screening. You may have this screening every year starting at age 6 if you have a 30-pack-year history of smoking and currently smoke or have quit within the past 15 years.  Fecal occult blood test (FOBT) of the stool. You may have this test every year starting at age 41.  Flexible sigmoidoscopy or colonoscopy. You may have a sigmoidoscopy every 5 years or a colonoscopy every 10 years starting at age 68.  Prostate cancer screening. Recommendations will vary depending on your family history and other risks.  Hepatitis C blood test.  Hepatitis B blood test.  Sexually transmitted disease (STD) testing.  Diabetes screening. This is done by checking your blood sugar (glucose) after you have not eaten for a while (fasting). You may have this done every 1-3 years.  Abdominal aortic aneurysm (AAA) screening. You may need this if you are a current or former smoker.  Osteoporosis. You may be screened starting at age 18 if you are at high risk. Talk with your health care provider about your test results, treatment options, and if  necessary, the need for more tests. Vaccines  Your health care provider may recommend certain vaccines, such as:  Influenza vaccine. This is recommended every year.  Tetanus, diphtheria, and acellular pertussis (Tdap, Td) vaccine. You may need a Td booster every 10 years.  Zoster  vaccine. You may need this after age 24.  Pneumococcal 13-valent conjugate (PCV13) vaccine. One dose is recommended after age 27.  Pneumococcal polysaccharide (PPSV23) vaccine. One dose is recommended after age 23. Talk to your health care provider about which screenings and vaccines you need and how often you need them. This information is not intended to replace advice given to you by your health care provider. Make sure you discuss any questions you have with your health care provider. Document Released: 01/03/2016 Document Revised: 08/26/2016 Document Reviewed: 10/08/2015 Elsevier Interactive Patient Education  2017 [place] Prevention in the Home Falls can cause injuries. They can happen to people of all ages. There are many things you can do to make your home safe and to help prevent falls. What can I do on the outside of my home?  Regularly fix the edges of walkways and driveways and fix any cracks.  Remove anything that might make you trip as you walk through a door, such as a raised step or threshold.  Trim any bushes or trees on the path to your home.  Use bright outdoor lighting.  Clear any walking paths of anything that might make someone trip, such as rocks or tools.  Regularly check to see if handrails are loose or broken. Make sure that both sides of any steps have handrails.  Any raised decks and porches should have guardrails on the edges.  Have any leaves, snow, or ice cleared regularly.  Use sand or salt on walking paths during winter.  Clean up any spills in your garage right away. This includes oil or grease spills. What can I do in the bathroom?  Use night lights.  Install grab bars by the toilet and in the tub and shower. Do not use towel bars as grab bars.  Use non-skid mats or decals in the tub or shower.  If you need to sit down in the shower, use a plastic, non-slip stool.  Keep the floor dry. Clean up any water that spills on the  floor as soon as it happens.  Remove soap buildup in the tub or shower regularly.  Attach bath mats securely with double-sided non-slip rug tape.  Do not have throw rugs and other things on the floor that can make you trip. What can I do in the bedroom?  Use night lights.  Make sure that you have a light by your bed that is easy to reach.  Do not use any sheets or blankets that are too big for your bed. They should not hang down onto the floor.  Have a firm chair that has side arms. You can use this for support while you get dressed.  Do not have throw rugs and other things on the floor that can make you trip. What can I do in the kitchen?  Clean up any spills right away.  Avoid walking on wet floors.  Keep items that you use a lot in easy-to-reach places.  If you need to reach something above you, use a strong step stool that has a grab bar.  Keep electrical cords out of the way.  Do not use floor polish or wax that makes floors slippery. If you must  use wax, use non-skid floor wax.  Do not have throw rugs and other things on the floor that can make you trip. What can I do with my stairs?  Do not leave any items on the stairs.  Make sure that there are handrails on both sides of the stairs and use them. Fix handrails that are broken or loose. Make sure that handrails are as long as the stairways.  Check any carpeting to make sure that it is firmly attached to the stairs. Fix any carpet that is loose or worn.  Avoid having throw rugs at the top or bottom of the stairs. If you do have throw rugs, attach them to the floor with carpet tape.  Make sure that you have a light switch at the top of the stairs and the bottom of the stairs. If you do not have them, ask someone to add them for you. What else can I do to help prevent falls?  Wear shoes that:  Do not have high heels.  Have rubber bottoms.  Are comfortable and fit you well.  Are closed at the toe. Do not wear  sandals.  If you use a stepladder:  Make sure that it is fully opened. Do not climb a closed stepladder.  Make sure that both sides of the stepladder are locked into place.  Ask someone to hold it for you, if possible.  Clearly mark and make sure that you can see:  Any grab bars or handrails.  First and last steps.  Where the edge of each step is.  Use tools that help you move around (mobility aids) if they are needed. These include:  Canes.  Walkers.  Scooters.  Crutches.  Turn on the lights when you go into a dark area. Replace any light bulbs as soon as they burn out.  Set up your furniture so you have a clear path. Avoid moving your furniture around.  If any of your floors are uneven, fix them.  If there are any pets around you, be aware of where they are.  Review your medicines with your doctor. Some medicines can make you feel dizzy. This can increase your chance of falling. Ask your doctor what other things that you can do to help prevent falls. This information is not intended to replace advice given to you by your health care provider. Make sure you discuss any questions you have with your health care provider. Document Released: 10/03/2009 Document Revised: 05/14/2016 Document Reviewed: 01/11/2015 Elsevier Interactive Patient Education  2017 [employer].

## 2019-02-15 ENCOUNTER — Encounter: Payer: Self-pay | Admitting: Family Medicine

## 2019-02-22 ENCOUNTER — Ambulatory Visit: Payer: Self-pay | Admitting: Cardiovascular Disease

## 2019-03-07 ENCOUNTER — Ambulatory Visit: Payer: Self-pay

## 2019-03-16 ENCOUNTER — Other Ambulatory Visit: Payer: Self-pay

## 2019-03-23 ENCOUNTER — Ambulatory Visit: Payer: Medicare Other | Admitting: Family Medicine

## 2019-03-25 ENCOUNTER — Other Ambulatory Visit: Payer: Self-pay | Admitting: Cardiovascular Disease

## 2019-03-25 DIAGNOSIS — I10 Essential (primary) hypertension: Secondary | ICD-10-CM

## 2019-03-29 ENCOUNTER — Telehealth: Payer: Self-pay | Admitting: Cardiovascular Disease

## 2019-03-29 NOTE — Telephone Encounter (Signed)
Patient calling to make office aware  States that he is wanting to start back on Eliquis medication He will need patient assistance forms completed, should be arriving by fax Please advise

## 2019-03-29 NOTE — Telephone Encounter (Signed)
Call returned to the patient. He stated that he would like to restart the Eliquis since he may be able to receive assistance now. He is having the paperwork faxed to the office. He has been advised that once the papers are received then the switch to eliquis can be addressed.

## 2019-04-03 NOTE — Telephone Encounter (Signed)
Spoke with patient and reviewed that we have not received any paperwork from the assistance program. He reports discussing this with the company and that they would fax [place] the information they need. Advised that we would need his 2019 social security statement, expense report from pharmacy showing if he has met the 3% out of pocket, and if he has met those requirements he may then qualify. Let him know that I would certainly keep a eye out for this but if he does not hear anything in a week or so then to please give [place] a call back and we will be happy to assist if possible. He was appreciative for the call with no further questions at this time.

## 2019-05-01 ENCOUNTER — Other Ambulatory Visit: Payer: Self-pay | Admitting: Cardiovascular Disease

## 2019-05-01 DIAGNOSIS — E785 Hyperlipidemia, unspecified: Secondary | ICD-10-CM

## 2019-06-15 ENCOUNTER — Telehealth: Payer: Self-pay

## 2019-06-15 ENCOUNTER — Other Ambulatory Visit: Payer: Self-pay

## 2019-06-15 ENCOUNTER — Other Ambulatory Visit: Payer: Medicare Other

## 2019-06-15 DIAGNOSIS — I480 Paroxysmal atrial fibrillation: Secondary | ICD-10-CM | POA: Diagnosis not present

## 2019-06-15 DIAGNOSIS — I10 Essential (primary) hypertension: Secondary | ICD-10-CM

## 2019-06-15 DIAGNOSIS — E782 Mixed hyperlipidemia: Secondary | ICD-10-CM

## 2019-06-15 DIAGNOSIS — Z7901 Long term (current) use of anticoagulants: Secondary | ICD-10-CM | POA: Diagnosis not present

## 2019-06-15 DIAGNOSIS — Z1159 Encounter for screening for other viral diseases: Secondary | ICD-10-CM

## 2019-06-15 DIAGNOSIS — R7309 Other abnormal glucose: Secondary | ICD-10-CM

## 2019-06-15 DIAGNOSIS — R972 Elevated prostate specific antigen [PSA]: Secondary | ICD-10-CM

## 2019-06-15 NOTE — Telephone Encounter (Signed)
Signed labs  Yearly medicare check up

## 2019-06-16 LAB — COMPREHENSIVE METABOLIC PANEL
AG Ratio: 1.6 (calc) (ref 1.0–2.5)
ALT: 16 U/L (ref 9–46)
AST: 26 U/L (ref 10–35)
Albumin: 3.9 g/dL (ref 3.6–5.1)
Alkaline phosphatase (APISO): 82 U/L (ref 35–144)
BUN/Creatinine Ratio: 13 (calc) (ref 6–22)
BUN: 16 mg/dL (ref 7–25)
CO2: 27 mmol/L (ref 20–32)
Calcium: 9.1 mg/dL (ref 8.6–10.3)
Chloride: 108 mmol/L (ref 98–110)
Creat: 1.25 mg/dL — ABNORMAL HIGH (ref 0.70–1.18)
Globulin: 2.5 g/dL (calc) (ref 1.9–3.7)
Glucose, Bld: 83 mg/dL (ref 65–99)
Potassium: 4.2 mmol/L (ref 3.5–5.3)
Sodium: 142 mmol/L (ref 135–146)
Total Bilirubin: 0.9 mg/dL (ref 0.2–1.2)
Total Protein: 6.4 g/dL (ref 6.1–8.1)

## 2019-06-16 LAB — CBC WITH DIFFERENTIAL/PLATELET
Absolute Monocytes: 352 cells/uL (ref 200–950)
Basophils Absolute: 40 cells/uL (ref 0–200)
Basophils Relative: 0.9 %
Eosinophils Absolute: 101 cells/uL (ref 15–500)
Eosinophils Relative: 2.3 %
HCT: 43.2 % (ref 38.5–50.0)
Hemoglobin: 14.6 g/dL (ref 13.2–17.1)
Lymphs Abs: 1558 cells/uL (ref 850–3900)
MCH: 30.5 pg (ref 27.0–33.0)
MCHC: 33.8 g/dL (ref 32.0–36.0)
MCV: 90.4 fL (ref 80.0–100.0)
MPV: 10.8 fL (ref 7.5–12.5)
Monocytes Relative: 8 %
Neutro Abs: 2350 cells/uL (ref 1500–7800)
Neutrophils Relative %: 53.4 %
Platelets: 137 10*3/uL — ABNORMAL LOW (ref 140–400)
RBC: 4.78 10*6/uL (ref 4.20–5.80)
RDW: 15.1 % — ABNORMAL HIGH (ref 11.0–15.0)
Total Lymphocyte: 35.4 %
WBC: 4.4 10*3/uL (ref 3.8–10.8)

## 2019-06-16 LAB — HEMOGLOBIN A1C
Hgb A1c MFr Bld: 5.4 % of total Hgb (ref <5.7)
Mean Plasma Glucose: 108 (calc)
eAG (mmol/L): 6 (calc)

## 2019-06-21 ENCOUNTER — Other Ambulatory Visit: Payer: Self-pay

## 2019-06-21 ENCOUNTER — Ambulatory Visit (INDEPENDENT_AMBULATORY_CARE_PROVIDER_SITE_OTHER): Payer: Medicare Other | Admitting: Family Medicine

## 2019-06-21 ENCOUNTER — Encounter: Payer: Self-pay | Admitting: Family Medicine

## 2019-06-21 VITALS — BP 109/84 | HR 86 | Temp 98.6°F | Resp 16 | Ht 71.0 in | Wt 184.0 lb

## 2019-06-21 DIAGNOSIS — R1032 Left lower quadrant pain: Secondary | ICD-10-CM

## 2019-06-21 NOTE — Progress Notes (Signed)
Subjective:    Patient ID: Andrew Benson, male    DOB: 01/12/1946, 73 y.o.   MRN: [id number]  Andrew Benson is a 73 y.o. male presenting on 06/21/2019 for Abdominal Pain (left side with ROM hurts with more bending over but today is not bad as yesterday)   HPI   Accompanied by wife, Andrew Benson, who provides additional history  LEFT LOWER ABDOMINAL PAIN Reports onset Left sided lower abdominal pain mostly a sharp pain, worse with movements and bending, but still present if at rest and not active, mild at times 0-2 and most severe up to 7-8 with activity, if pressing on it it was worse. Persistent pain, he has rested some with improvement today now without pain at rest only with provoked activity or pressure, he feels it is improving. - Admits some abdominal bloating - Admits some mild loose stool yesterday, same today, no actual regular bowel movement - Denies fevers, chills, sweats, cough congestion, headache sinusitis   Depression screen [facility] 2/9 06/21/2019 02/14/2019 12/29/2018  Decreased Interest 0 0 0  Down, Depressed, Hopeless 0 0 0  PHQ - 2 Score 0 0 0    Social History   Tobacco Use  . Smoking status: Former Smoker    Packs/day: 1.00    Years: 20.00    Pack years: 20.00    Types: Cigarettes    Quit date: 02/01/1993    Years since quitting: 26.4  . Smokeless tobacco: Never Used  Substance Use Topics  . Alcohol use: Yes    Alcohol/week: 0.0 standard drinks    Comment: social drinker.  . Drug use: No    Review of Systems Per HPI unless specifically indicated above     Objective:    BP 109/84   Pulse 86   Temp 98.6 F (37 C) (Oral)   Resp 16   Ht 5\' 11"  (1.803 m)   Wt 184 lb (83.5 kg)   BMI 25.66 kg/m   Wt Readings from Last 3 Encounters:  06/21/19 184 lb (83.5 kg)  02/14/19 186 lb (84.4 kg)  02/14/19 186 lb (84.4 kg)    Physical Exam Vitals signs and nursing note reviewed.  Constitutional:      General: He is not in acute distress.    Appearance: He is  well-developed. He is not diaphoretic.     Comments: Well-appearing, comfortable, cooperative  HENT:     Head: Normocephalic and atraumatic.  Eyes:     General:        Right eye: No discharge.        Left eye: No discharge.     Conjunctiva/sclera: Conjunctivae normal.  Cardiovascular:     Rate and Rhythm: Normal rate.  Pulmonary:     Effort: Pulmonary effort is normal.  Abdominal:     Tenderness: There is abdominal tenderness in the left lower quadrant. There is no right CVA tenderness, left CVA tenderness, guarding or rebound. Negative signs include Murphy's sign, Rovsing's sign and McBurney's sign.     Hernia: No hernia is present.  Skin:    General: Skin is warm and dry.     Findings: No erythema or rash.  Neurological:     Mental Status: He is alert and oriented to person, place, and time.  Psychiatric:        Behavior: Behavior normal.     Comments: Well groomed, good eye contact, normal speech and thoughts    Results for orders placed or performed in visit on 06/15/19  Hemoglobin  A1c  Result Value Ref Range   Hgb A1c MFr Bld 5.4 <5.7 % of total Hgb   Mean Plasma Glucose 108 (calc)   eAG (mmol/L) 6.0 (calc)  CBC w/Diff/Platelet  Result Value Ref Range   WBC 4.4 3.8 - 10.8 Thousand/uL   RBC 4.78 4.20 - 5.80 Million/uL   Hemoglobin 14.6 13.2 - 17.1 g/dL   HCT 43.2 38.5 - 50.0 %   MCV 90.4 80.0 - 100.0 fL   MCH 30.5 27.0 - 33.0 pg   MCHC 33.8 32.0 - 36.0 g/dL   RDW 15.1 (H) 11.0 - 15.0 %   Platelets 137 (L) 140 - 400 Thousand/uL   MPV 10.8 7.5 - 12.5 fL   Neutro Abs 2,350 1,500 - 7,800 cells/uL   Lymphs Abs 1,558 850 - 3,900 cells/uL   Absolute Monocytes 352 200 - 950 cells/uL   Eosinophils Absolute 101 15 - 500 cells/uL   Basophils Absolute 40 0 - 200 cells/uL   Neutrophils Relative % 53.4 %   Total Lymphocyte 35.4 %   Monocytes Relative 8.0 %   Eosinophils Relative 2.3 %   Basophils Relative 0.9 %  Comprehensive metabolic panel  Result Value Ref Range    Glucose, Bld 83 65 - 99 mg/dL   BUN 16 7 - 25 mg/dL   Creat 1.25 (H) 0.70 - 1.18 mg/dL   BUN/Creatinine Ratio 13 6 - 22 (calc)   Sodium 142 135 - 146 mmol/L   Potassium 4.2 3.5 - 5.3 mmol/L   Chloride 108 98 - 110 mmol/L   CO2 27 20 - 32 mmol/L   Calcium 9.1 8.6 - 10.3 mg/dL   Total Protein 6.4 6.1 - 8.1 g/dL   Albumin 3.9 3.6 - 5.1 g/dL   Globulin 2.5 1.9 - 3.7 g/dL (calc)   AG Ratio 1.6 1.0 - 2.5 (calc)   Total Bilirubin 0.9 0.2 - 1.2 mg/dL   Alkaline phosphatase (APISO) 82 35 - 144 U/L   AST 26 10 - 35 U/L   ALT 16 9 - 46 U/L      Assessment & Plan:   Problem List Items Addressed This Visit    None    Visit Diagnoses    Left lower quadrant abdominal pain    -  Primary      Clinically LLQ abdominal pain mostly benign and seems improving today, < 24 hour onset Seems functional vs constipation, possibly loose stool represents area of fecal impaction or similar constipation No other concerning GI red flag symptoms to suggest other acute abdominal or GI illness at this time Possible viral gastro as well, but without nausea vomiting. No other infectious symptoms of fever chills. Unlikely COVID19 based on lack of other symptoms. No known exposure, and close contact wife, asymptomatic  Plan - Reassurance - Trial with improve diet as discussed to improve fiber, hydration, avoid triggers - May take metamucil fiber, probiotic - Consider Dicyclomine if not improving abdominal symptom - Recommend miralax trial as well to promote or reset bowel movements - Future consider KUB if needed Return precautions criteria given  No orders of the defined types were placed in this encounter.   Follow up plan: Return in about 1 week (around 06/28/2019), or if symptoms worsen or fail to improve, for abdominal pain.  [name], [facility] 06/21/2019, 2:53 PM

## 2019-06-21 NOTE — Patient Instructions (Addendum)
Thank you for coming to the office today.  Left lower abdominal pain and symptoms can be digestive related with colon  Can also be a gastroenteritis type issue  Possibly if too much loose stool can use Imodium OTC  For Constipation (less frequent bowel movement that can be hard dry or involve straining).  Recommend trying OTC Miralax 17g = 1 capful in large glass water once daily for now, try several days to see if working, goal is soft stool or BM 1-2 times daily, if too loose then reduce dose or try every other day. If not effective may need to increase it to 2 doses at once in AM or may do 1 in morning and 1 in afternoon/evening  - This medicine is very safe and can be used often without any problem and will not make you dehydrated. It is good for use on AS NEEDED BASIS or even MAINTENANCE therapy for longer term for several days to weeks at a time to help regulate bowel movements  Other more natural remedies or preventative treatment: - Increase hydration with water - Increase fiber in diet (high fiber foods = vegetables, leafy greens, oats/grains) - May take OTC Fiber supplement (metamucil powder or pill/gummy) - May try OTC Probiotic  Please schedule a Follow-up Appointment to: Return in about 1 week (around 06/28/2019), or if symptoms worsen or fail to improve, for abdominal pain.  If you have any other questions or concerns, please feel free to call the office or send a message through [place]. You may also schedule an earlier appointment if necessary.  Additionally, you may be receiving a survey about your experience at our office within a few days to 1 week by e-mail or mail. We value your feedback.  [name], DO [place]

## 2019-06-22 ENCOUNTER — Ambulatory Visit (INDEPENDENT_AMBULATORY_CARE_PROVIDER_SITE_OTHER): Payer: Medicare Other | Admitting: Family Medicine

## 2019-06-22 ENCOUNTER — Encounter: Payer: Self-pay | Admitting: Family Medicine

## 2019-06-22 VITALS — BP 122/70 | HR 70 | Temp 97.9°F | Resp 15 | Ht 71.0 in | Wt 183.6 lb

## 2019-06-22 DIAGNOSIS — E782 Mixed hyperlipidemia: Secondary | ICD-10-CM

## 2019-06-22 DIAGNOSIS — E038 Other specified hypothyroidism: Secondary | ICD-10-CM

## 2019-06-22 DIAGNOSIS — N183 Chronic kidney disease, stage 3 unspecified: Secondary | ICD-10-CM

## 2019-06-22 DIAGNOSIS — I129 Hypertensive chronic kidney disease with stage 1 through stage 4 chronic kidney disease, or unspecified chronic kidney disease: Secondary | ICD-10-CM | POA: Diagnosis not present

## 2019-06-22 DIAGNOSIS — I480 Paroxysmal atrial fibrillation: Secondary | ICD-10-CM

## 2019-06-22 NOTE — Assessment & Plan Note (Addendum)
Stable to improved, GFR < 60 Known HTN On low dose ACEi Re-check 6 months

## 2019-06-22 NOTE — Patient Instructions (Addendum)
Thank you for coming to the office today.  Great work overall! Keep it up  Added on cholesterol lab, will be available 1-2 days  Will check thyroid and kidney labs in 6 months  DUE for FASTING BLOOD WORK (no food or drink after midnight before the lab appointment, only water or coffee without cream/sugar on the morning of)  SCHEDULE "Lab Only" visit in the morning at the clinic for lab draw in 6 MONTHS   - Make sure Lab Only appointment is at about 1 week before your next appointment, so that results will be available  For Lab Results, once available within 2-3 days of blood draw, you can can log in to MyChart online to view your results and a brief explanation. Also, we can discuss results at next follow-up visit.   Please schedule a Follow-up Appointment to: Return in about 6 months (around 12/23/2019) for 6 months CKD, Thyroid, follow-up labs.  If you have any other questions or concerns, please feel free to call the office or send a message through [place]. You may also schedule an earlier appointment if necessary.  Additionally, you may be receiving a survey about your experience at our office within a few days to 1 week by e-mail or mail. We value your feedback.  [name], DO [place]

## 2019-06-22 NOTE — Assessment & Plan Note (Signed)
Well-controlled HTN - Home BP readings stable  Complication CKD-II, PAFib, now off anticoag    Plan:  1. Continue current BP regimen - Lisinopril 2.5mg  for renal protection 2. Encourage improved lifestyle - low sodium diet, regular exercise 3. Continue monitor BP outside office, bring readings to next visit, if persistently >140/90 or new symptoms notify office sooner 4. Follow-up q 6 months for CKD Cr trend

## 2019-06-22 NOTE — Assessment & Plan Note (Signed)
Clinically stable Remains off levothyroxine Can re-check TSH Free T4 in 6 months F/u

## 2019-06-22 NOTE — Assessment & Plan Note (Signed)
Stable, controlled Paroxysmal Atrial Fibrillation, >25 yr Followed by Cardiology Dr Rockey Situ Has Amio/Metop PRN only rare occurrence - x 1 use in past 6 months On ASA 81mg  daily OFF Anticoagulation due to cost/coverage  - Advised patient to further discussion long term anticoagulation with Cardiology again in future

## 2019-06-22 NOTE — Assessment & Plan Note (Signed)
Add fasting lipid panel to existing lab, initial was not drawn Continue Rosuvastatin daily at this time

## 2019-06-22 NOTE — Progress Notes (Signed)
Subjective:    Patient ID: Andrew Benson, male    DOB: [birth year], 73 y.o.   MRN: [id number]  Andrew Benson is a 73 y.o. male presenting on 06/22/2019 for Hyperlipidemia and Chronic Kidney Disease   HPI   Specialists: Cardiology - Dr Ida Rogue ([place]) Urology - [facility]  CHRONIC HTN/ CKD-III Paroxysmal Atrial Fibrillation Update from Cardiology - Off Xarelto now temporarily, unable to get Eliquis covered as well, he decided to come off anticoagulation for now, maybe reconsider in future Last lab Creatinine improved still mild reduced CKD Checks BP often at home Current Meds -Lisinopril 2.5mg  daily and has Metoprolol 25mg  BID PRN ONLY Afib - taking ASA 81mg  daily Reports good compliance, took meds today. Tolerating well, w/o complaints. Denies CP, dyspnea, HA, edema, dizziness / lightheadedness, bleeding problem or bruising  HYPERLIPIDEMIA: - Reports no concerns - pending result on lipids still - Currently takingRosuvastatin 20mg  daily, tolerating well without side effects or myalgias - On ASA 81  History of Central Hypothyroidism - Followed previously by [facility] Endocrin[facility] Dr Gabriel Carina back in 2018 He had Low TSH, Low T3, and Low-normal T4, and low-normal update on thyroid scan. Other hormones pituitary normal. He was taken off of low dose Levothyroxine. - Today no new concerns, remains off levothyroxine, has not scheduled w/ Endocrinology - He is interested in repeat test, otherwise no new concerns.  Elevated PSA / BPH Followed by [facility] Urology. Prior PSA history elevated in 4 range, has been stable. He is followed q 6 months as planned. No new concerns from Urology. Last updates had MRI imaging of prostate unremarkable.  Additional update - He is significantly improved since yesterday from visit 7/1 here for abdominal pain, now no significant abdominal pain, had more regular BM now, improving diet and tried tea and other natural option to improve his  bowel function  Health Maintenance:  Colon CA Screening: Last Colonoscopy1/9/19, done by Dr Rolly Salter [facility], had multiple polyps tubular adenoma and other benign non high grade polyps, advised he is next due in 3 years. No known family history of colon CA. Due for screening test   Depression screen [facility] 2/9 06/22/2019 06/21/2019 02/14/2019  Decreased Interest 0 0 0  Down, Depressed, Hopeless 0 0 0  PHQ - 2 Score 0 0 0    Past Medical History:  Diagnosis Date  . Asthma   . Essential hypertension   . Hyperlipidemia   . PAF (paroxysmal atrial fibrillation) (HCC)    a. CHA2DS2VASc = 2;  b. takes amio/eliquis/metoprolol prn for PAF.   Past Surgical History:  Procedure Laterality Date  . COLONOSCOPY WITH PROPOFOL N/A 12/29/2017   Procedure: COLONOSCOPY WITH PROPOFOL;  Surgeon: Manya Silvas, MD;  Location: [facility] ENDOSCOPY;  Service: Endoscopy;  Laterality: N/A;  . FINGER SURGERY     left index finger  . HERNIA REPAIR    . INGUINAL HERNIA REPAIR    . KNEE SURGERY     left    Social History   Socioeconomic History  . Marital status: Married    Spouse name: Not on file  . Number of children: Not on file  . Years of education: Not on file  . Highest education level: Associate degree: academic program  Occupational History  . Not on file  Social Needs  . Financial resource strain: Not hard at all  . Food insecurity    Worry: Never true    Inability: Never true  . Transportation needs    Medical:  No    Non-medical: No  Tobacco Use  . Smoking status: Former Smoker    Packs/day: 1.00    Years: 20.00    Pack years: 20.00    Types: Cigarettes    Quit date: 02/01/1993    Years since quitting: 26.4  . Smokeless tobacco: Never Used  Substance and Sexual Activity  . Alcohol use: Yes    Alcohol/week: 0.0 standard drinks    Comment: social drinker.  . Drug use: No  . Sexual activity: Not on file  Lifestyle  . Physical activity    Days per week: 2 days    Minutes per session:  60 min  . Stress: Not at all  Relationships  . Social connections    Talks on phone: More than three times a week    Gets together: More than three times a week    Attends religious service: More than 4 times per year    Active member of club or organization: Yes    Attends meetings of clubs or organizations: More than 4 times per year    Relationship status: Married  . Intimate partner violence    Fear of current or ex partner: No    Emotionally abused: No    Physically abused: No    Forced sexual activity: No  Other Topics Concern  . Not on file  Social History Narrative   Participates in church activities, and food bank 3 days a week   Gym 2 times weekly- treadmill    Family History  Problem Relation Age of Onset  . Hyperlipidemia Mother   . Hypertension Mother   . Heart disease Mother   . Heart disease Sister        valve repair   . Diabetes Brother   . Hypertension Brother    Current Outpatient Medications on File Prior to Visit  Medication Sig  . aspirin EC 81 MG tablet Take 81 mg by mouth daily.  Marland Kitchen lisinopril (PRINIVIL,ZESTRIL) 2.5 MG tablet TAKE 1 TABLET (5 MG TOTAL) BY MOUTH DAILY.  . Multiple Vitamin (MULTIVITAMIN) capsule Take 1 capsule by mouth daily.  . rosuvastatin (CRESTOR) 20 MG tablet TAKE 1 TABLET (20 MG TOTAL) BY MOUTH DAILY.  . sildenafil (REVATIO) 20 MG tablet 3-5 po tablets daily as needed   No current facility-administered medications on file prior to visit.     Review of Systems  Constitutional: Negative for activity change, appetite change, chills, diaphoresis, fatigue and fever.  HENT: Negative for congestion and hearing loss.   Eyes: Negative for visual disturbance.  Respiratory: Negative for apnea, cough, chest tightness, shortness of breath and wheezing.   Cardiovascular: Negative for chest pain, palpitations and leg swelling.  Gastrointestinal: Negative for abdominal pain, anal bleeding, blood in stool, constipation, diarrhea, nausea and  vomiting.  Endocrine: Negative for cold intolerance.  Genitourinary: Negative for difficulty urinating, dysuria, frequency and hematuria.  Musculoskeletal: Negative for arthralgias, back pain and neck pain.  Skin: Negative for rash.  Allergic/Immunologic: Negative for environmental allergies.  Neurological: Negative for dizziness, weakness, light-headedness, numbness and headaches.  Hematological: Negative for adenopathy.  Psychiatric/Behavioral: Negative for behavioral problems, dysphoric mood and sleep disturbance. The patient is not nervous/anxious.    Per HPI unless specifically indicated above      Objective:    BP 122/70 (BP Location: Left Arm, Patient Position: Sitting, Cuff Size: Normal)   Pulse 70   Temp 97.9 F (36.6 C) (Oral)   Resp 15   Ht 5\' 11"  (1.803  m)   Wt 183 lb 9.6 oz (83.3 kg)   SpO2 100%   BMI 25.61 kg/m   Wt Readings from Last 3 Encounters:  06/22/19 183 lb 9.6 oz (83.3 kg)  06/21/19 184 lb (83.5 kg)  02/14/19 186 lb (84.4 kg)    Physical Exam Vitals signs and nursing note reviewed.  Constitutional:      General: He is not in acute distress.    Appearance: He is well-developed. He is not diaphoretic.     Comments: Well-appearing, comfortable, cooperative  HENT:     Head: Normocephalic and atraumatic.  Eyes:     General:        Right eye: No discharge.        Left eye: No discharge.     Conjunctiva/sclera: Conjunctivae normal.  Neck:     Musculoskeletal: Normal range of motion and neck supple.     Thyroid: No thyromegaly.     Comments: No carotid bruits Cardiovascular:     Rate and Rhythm: Normal rate and regular rhythm.     Heart sounds: Normal heart sounds. No murmur.  Pulmonary:     Effort: Pulmonary effort is normal. No respiratory distress.     Breath sounds: Normal breath sounds. No wheezing or rales.  Abdominal:     General: Bowel sounds are normal. There is no distension.     Palpations: Abdomen is soft.     Tenderness: There is no  abdominal tenderness (RESOLVED LLQ).  Musculoskeletal: Normal range of motion.  Lymphadenopathy:     Cervical: No cervical adenopathy.  Skin:    General: Skin is warm and dry.     Findings: No erythema or rash.  Neurological:     Mental Status: He is alert and oriented to person, place, and time.  Psychiatric:        Behavior: Behavior normal.     Comments: Well groomed, good eye contact, normal speech and thoughts    Results for orders placed or performed in visit on 06/15/19  Hemoglobin A1c  Result Value Ref Range   Hgb A1c MFr Bld 5.4 <5.7 % of total Hgb   Mean Plasma Glucose 108 (calc)   eAG (mmol/L) 6.0 (calc)  CBC w/Diff/Platelet  Result Value Ref Range   WBC 4.4 3.8 - 10.8 Thousand/uL   RBC 4.78 4.20 - 5.80 Million/uL   Hemoglobin 14.6 13.2 - 17.1 g/dL   HCT 43.2 38.5 - 50.0 %   MCV 90.4 80.0 - 100.0 fL   MCH 30.5 27.0 - 33.0 pg   MCHC 33.8 32.0 - 36.0 g/dL   RDW 15.1 (H) 11.0 - 15.0 %   Platelets 137 (L) 140 - 400 Thousand/uL   MPV 10.8 7.5 - 12.5 fL   Neutro Abs 2,350 1,500 - 7,800 cells/uL   Lymphs Abs 1,558 850 - 3,900 cells/uL   Absolute Monocytes 352 200 - 950 cells/uL   Eosinophils Absolute 101 15 - 500 cells/uL   Basophils Absolute 40 0 - 200 cells/uL   Neutrophils Relative % 53.4 %   Total Lymphocyte 35.4 %   Monocytes Relative 8.0 %   Eosinophils Relative 2.3 %   Basophils Relative 0.9 %  Comprehensive metabolic panel  Result Value Ref Range   Glucose, Bld 83 65 - 99 mg/dL   BUN 16 7 - 25 mg/dL   Creat 1.25 (H) 0.70 - 1.18 mg/dL   BUN/Creatinine Ratio 13 6 - 22 (calc)   Sodium 142 135 - 146 mmol/L   Potassium  4.2 3.5 - 5.3 mmol/L   Chloride 108 98 - 110 mmol/L   CO2 27 20 - 32 mmol/L   Calcium 9.1 8.6 - 10.3 mg/dL   Total Protein 6.4 6.1 - 8.1 g/dL   Albumin 3.9 3.6 - 5.1 g/dL   Globulin 2.5 1.9 - 3.7 g/dL (calc)   AG Ratio 1.6 1.0 - 2.5 (calc)   Total Bilirubin 0.9 0.2 - 1.2 mg/dL   Alkaline phosphatase (APISO) 82 35 - 144 U/L   AST 26 10 -  35 U/L   ALT 16 9 - 46 U/L      Assessment & Plan:   Problem List Items Addressed This Visit    Benign hypertension with CKD (chronic kidney disease) stage III (HCC) - Primary    Well-controlled HTN - Home BP readings stable  Complication CKD-II, PAFib, now off anticoag    Plan:  1. Continue current BP regimen - Lisinopril 2.5mg  for renal protection 2. Encourage improved lifestyle - low sodium diet, regular exercise 3. Continue monitor BP outside office, bring readings to next visit, if persistently >140/90 or new symptoms notify office sooner 4. Follow-up q 6 months for CKD Cr trend      Relevant Medications   aspirin EC 81 MG tablet   Other Relevant Orders   BASIC METABOLIC PANEL WITH GFR   Central hypothyroidism    Clinically stable Remains off levothyroxine Can re-check TSH Free T4 in 6 months F/u      Relevant Orders   TSH   T4, free   Chronic kidney disease (CKD), stage III (moderate) (HCC)    Stable to improved, GFR < 60 Known HTN On low dose ACEi Re-check 6 months      Relevant Orders   BASIC METABOLIC PANEL WITH GFR   Hyperlipidemia    Add fasting lipid panel to existing lab, initial was not drawn Continue Rosuvastatin daily at this time      Relevant Medications   aspirin EC 81 MG tablet   Paroxysmal atrial fibrillation (HCC)    Stable, controlled Paroxysmal Atrial Fibrillation, >25 yr Followed by Cardiology Dr Rockey Situ Has Amio/Metop PRN only rare occurrence - x 1 use in past 6 months On ASA 81mg  daily OFF Anticoagulation due to cost/coverage  - Advised patient to further discussion long term anticoagulation with Cardiology again in future      Relevant Medications   aspirin EC 81 MG tablet      No orders of the defined types were placed in this encounter.   Follow up plan: Return in about 6 months (around 12/23/2019) for 6 months CKD, Thyroid, follow-up labs.  Future labs for thyroid and chemistry 11/2019  [name], DO  [facility] 06/22/2019, 9:02 AM

## 2019-09-21 ENCOUNTER — Other Ambulatory Visit: Payer: Self-pay | Admitting: Cardiovascular Disease

## 2019-09-21 DIAGNOSIS — I10 Essential (primary) hypertension: Secondary | ICD-10-CM

## 2019-10-04 ENCOUNTER — Ambulatory Visit (INDEPENDENT_AMBULATORY_CARE_PROVIDER_SITE_OTHER): Payer: Medicare Other

## 2019-10-04 ENCOUNTER — Other Ambulatory Visit: Payer: Self-pay

## 2019-10-04 DIAGNOSIS — Z23 Encounter for immunization: Secondary | ICD-10-CM | POA: Diagnosis not present

## 2019-10-13 ENCOUNTER — Other Ambulatory Visit: Payer: Self-pay

## 2019-10-13 ENCOUNTER — Other Ambulatory Visit: Payer: Self-pay | Admitting: Family Medicine

## 2019-10-13 ENCOUNTER — Ambulatory Visit (INDEPENDENT_AMBULATORY_CARE_PROVIDER_SITE_OTHER): Payer: Medicare Other | Admitting: Family Medicine

## 2019-10-13 ENCOUNTER — Encounter: Payer: Self-pay | Admitting: Family Medicine

## 2019-10-13 DIAGNOSIS — N401 Enlarged prostate with lower urinary tract symptoms: Secondary | ICD-10-CM

## 2019-10-13 DIAGNOSIS — N138 Other obstructive and reflux uropathy: Secondary | ICD-10-CM

## 2019-10-13 DIAGNOSIS — R339 Retention of urine, unspecified: Secondary | ICD-10-CM

## 2019-10-13 LAB — POCT URINALYSIS DIPSTICK
Bilirubin, UA: NEGATIVE
Blood, UA: NEGATIVE
Glucose, UA: NEGATIVE
Ketones, UA: NEGATIVE
Leukocytes, UA: NEGATIVE
Nitrite, UA: NEGATIVE
Protein, UA: NEGATIVE
Spec Grav, UA: 1.01 (ref 1.010–1.025)
Urobilinogen, UA: 0.2 E.U./dL
pH, UA: 5 (ref 5.0–8.0)

## 2019-10-13 MED ORDER — TAMSULOSIN HCL 0.4 MG PO CAPS: 0.4000 mg | ORAL_CAPSULE | Freq: Every day | ORAL | 2 refills | Status: DC

## 2019-10-13 NOTE — Progress Notes (Signed)
Virtual Visit via Telephone The purpose of this virtual visit is to provide medical care while limiting exposure to the novel coronavirus (COVID19) for both patient and office staff.  Consent was obtained for phone visit:  Yes.   Answered questions that patient had about telehealth interaction:  Yes.   I discussed the limitations, risks, security and privacy concerns of performing an evaluation and management service by telephone. I also discussed with the patient that there may be a patient responsible charge related to this service. The patient expressed understanding and agreed to proceed.  Patient Location: Home Provider Location: [facility])  ---------------------------------------------------------------------- Chief Complaint  Patient presents with  . Urinary Retention    onset 2 days his Urologist moved to [place] needed new referral    S: Reviewed CMA documentation. I have called patient and gathered additional HPI as follows:  BPH / Urinary retention / incomplete emptying Reports that symptoms started 2 days ago, increased urination and nocturia, he had incomplete voiding, if he held urine then would have more urgency and some decreased urine output. He still feels some difficulty voiding or feels full but inadequate voiding. - Previously followed by [facility] - previously Dr Kirke Corin, followed him for elevated PSA 4-5 range, and had MRI that was negative, he has not been on any BPH prostate medication before. - He admits some mild discomfort at times with urination and slight burning at end of voiding only - Denies any fever chills hematuria, anuria, abdominal pain or flank pain, nausea vomiting  Denies any high risk travel to areas of current concern for COVID19. Denies any known or suspected exposure to person with or possibly with COVID19.   Past Medical History:  Diagnosis Date  . Asthma   . Essential hypertension   . Hyperlipidemia   . PAF  (paroxysmal atrial fibrillation) (HCC)    a. CHA2DS2VASc = 2;  b. takes amio/eliquis/metoprolol prn for PAF.   Social History   Tobacco Use  . Smoking status: Former Smoker    Packs/day: 1.00    Years: 20.00    Pack years: 20.00    Types: Cigarettes    Quit date: 02/01/1993    Years since quitting: 26.7  . Smokeless tobacco: Never Used  Substance Use Topics  . Alcohol use: Yes    Alcohol/week: 0.0 standard drinks    Comment: social drinker.  . Drug use: No    Current Outpatient Medications:  .  aspirin EC 81 MG tablet, Take 81 mg by mouth daily., Disp: , Rfl:  .  lisinopril (ZESTRIL) 2.5 MG tablet, TAKE 1 TABLET BY MOUTH EVERY DAY, Disp: 90 tablet, Rfl: 0 .  Multiple Vitamin (MULTIVITAMIN) capsule, Take 1 capsule by mouth daily., Disp: , Rfl:  .  rosuvastatin (CRESTOR) 20 MG tablet, TAKE 1 TABLET (20 MG TOTAL) BY MOUTH DAILY., Disp: 90 tablet, Rfl: 2 .  sildenafil (REVATIO) 20 MG tablet, 3-5 po tablets daily as needed, Disp: , Rfl:  .  clobetasol ointment (TEMOVATE) 0.05 %, APPLY TO AFFECTED AREA EVERY DAY AS NEEDED, Disp: 30 g, Rfl: 2 .  tamsulosin (FLOMAX) 0.4 MG CAPS capsule, Take 1 capsule (0.4 mg total) by mouth daily., Disp: 30 capsule, Rfl: 2  Depression screen [facility] 2/9 10/13/2019 06/22/2019 06/21/2019  Decreased Interest 0 0 0  Down, Depressed, Hopeless 0 0 0  PHQ - 2 Score 0 0 0    No flowsheet data found.  -------------------------------------------------------------------------- O: No physical exam performed due to remote telephone  encounter.  Lab results reviewed.  Recent Results (from the past 2160 hour(s))  POCT urinalysis dipstick     Status: Normal   Collection Time: 10/13/19  2:05 PM  Result Value Ref Range   Color, UA amber    Clarity, UA clear    Glucose, UA Negative Negative   Bilirubin, UA negative    Ketones, UA negative    Spec Grav, UA 1.010 1.010 - 1.025   Blood, UA negative    pH, UA 5.0 5.0 - 8.0   Protein, UA Negative Negative    Urobilinogen, UA 0.2 0.2 or 1.0 E.U./dL   Nitrite, UA negative    Leukocytes, UA Negative Negative   Appearance     Odor      -------------------------------------------------------------------------- A&P:  Problem List Items Addressed This Visit    Incomplete bladder emptying   Relevant Medications   tamsulosin (FLOMAX) 0.4 MG CAPS capsule   Other Relevant Orders   Ambulatory referral to Urology   POCT urinalysis dipstick (Completed)   Urine Culture   Benign localized hyperplasia of prostate with urinary obstruction - Primary   Relevant Medications   tamsulosin (FLOMAX) 0.4 MG CAPS capsule   Other Relevant Orders   Ambulatory referral to Urology   POCT urinalysis dipstick (Completed)   Urine Culture     Acute on chronic flare of BPH LUTS with now more acute urinary retention and incomplete emptying, still emptying not complete retention at this time. Onset 2 days - Questionable if UTI from incomplete emptying but not entirely characteristic Never on BPH meds before - Last PSA 4-5 range followed by [facility] previously  Plan: 1. Return to office today since this was virtual visit - he can leave Urine sample for UA Dipstick and Urine Culture send out - f/u results - hold off on empiric antibiotic today  **Update results of UA dipstick, above - shows negative results, I called him reviewed result, he just took Flomax already, waiting on results, and I re ordered his clobetasol PRN as requested**  2. Start Tamsulosin 0.4mg  daily, advised on benefits, risks, if BP low caution with sudden standing up or position change  Referral to [facility] to establish for BPH and Elevated PSA, with urinary retention incomplete bladder emptying LUTS, he was followed by [facility] previously, now that doctor has left and he needs new Urologist, has had prostate MRI negative, just recently started on Tamsulosin and PCP checking urine to rule out infection. Request routine follow up  with Urology now  Orders Placed This Encounter  Procedures  . Urine Culture  . Ambulatory referral to Urology    Referral Priority:   Routine    Referral Type:   Consultation    Referral Reason:   Specialty Services Required    Requested Specialty:   Urology    Number of Visits Requested:   1  . POCT urinalysis dipstick     Meds ordered this encounter  Medications  . tamsulosin (FLOMAX) 0.4 MG CAPS capsule    Sig: Take 1 capsule (0.4 mg total) by mouth daily.    Dispense:  30 capsule    Refill:  2    Follow-up: - Return as needed 1-2 weeks for urinary frequency   Patient verbalizes understanding with the above medical recommendations including the limitation of remote medical advice.  Specific follow-up and call-back criteria were given for patient to follow-up or seek medical care more urgently if needed.  - Time spent in direct consultation  with patient on phone: 12 minutes  [name], [facility] 10/13/2019, 11:03 AM

## 2019-10-13 NOTE — Patient Instructions (Addendum)
Likely enlarged prostate reducing your urine flow Incomplete emptying of bladder  Try flomax (tamsulosin) 0.4mg  once daily can help improve urine flow and bladder emptying due to prostate Caution may get slightly dizzy when stand up suddenly  Come by office today to leave urine sample to check for infection, will call you if abnormal result and send to mychart may need antibiotics, may not be until next week we have results back.  Referral sent - stay tuned on apt from new Urologist locally to take over managing prostate.  [facility] -[address] floor [place] [place],  [place]  [address] Phone: [phone]  Please schedule a Follow-up Appointment to: Return in about 2 weeks (around 10/27/2019), or if symptoms worsen or fail to improve, for BPH prostate.  If you have any other questions or concerns, please feel free to call the office or send a message through [place]. You may also schedule an earlier appointment if necessary.  Additionally, you may be receiving a survey about your experience at our office within a few days to 1 week by e-mail or mail. We value your feedback.  [name], DO [place]

## 2019-10-14 LAB — URINE CULTURE
MICRO NUMBER:: 1023893
Result:: NO GROWTH
SPECIMEN QUALITY:: ADEQUATE

## 2019-11-15 ENCOUNTER — Ambulatory Visit: Payer: Medicare Other | Admitting: Urology

## 2019-12-19 ENCOUNTER — Other Ambulatory Visit: Payer: Medicare Other

## 2019-12-19 ENCOUNTER — Other Ambulatory Visit: Payer: Self-pay

## 2019-12-19 DIAGNOSIS — E038 Other specified hypothyroidism: Secondary | ICD-10-CM

## 2019-12-19 DIAGNOSIS — I129 Hypertensive chronic kidney disease with stage 1 through stage 4 chronic kidney disease, or unspecified chronic kidney disease: Secondary | ICD-10-CM

## 2019-12-19 DIAGNOSIS — N183 Chronic kidney disease, stage 3 unspecified: Secondary | ICD-10-CM

## 2019-12-20 ENCOUNTER — Other Ambulatory Visit: Payer: Self-pay | Admitting: Cardiovascular Disease

## 2019-12-20 DIAGNOSIS — I10 Essential (primary) hypertension: Secondary | ICD-10-CM

## 2019-12-20 LAB — BASIC METABOLIC PANEL WITH GFR
BUN/Creatinine Ratio: 12 (calc) (ref 6–22)
BUN: 15 mg/dL (ref 7–25)
CO2: 29 mmol/L (ref 20–32)
Calcium: 9.1 mg/dL (ref 8.6–10.3)
Chloride: 106 mmol/L (ref 98–110)
Creat: 1.29 mg/dL — ABNORMAL HIGH (ref 0.70–1.18)
GFR, Est African American: 64 mL/min/{1.73_m2} (ref >=60)
GFR, Est Non African American: 55 mL/min/{1.73_m2} — ABNORMAL LOW (ref >=60)
Glucose, Bld: 99 mg/dL (ref 65–99)
Potassium: 4 mmol/L (ref 3.5–5.3)
Sodium: 140 mmol/L (ref 135–146)

## 2019-12-20 LAB — T4, FREE: Free T4: 0.9 ng/dL (ref 0.8–1.8)

## 2019-12-20 LAB — TSH: TSH: 0.93 mIU/L (ref 0.40–4.50)

## 2019-12-26 ENCOUNTER — Other Ambulatory Visit: Payer: Self-pay | Admitting: Family Medicine

## 2019-12-26 ENCOUNTER — Encounter: Payer: Self-pay | Admitting: Family Medicine

## 2019-12-26 ENCOUNTER — Other Ambulatory Visit: Payer: Self-pay

## 2019-12-26 ENCOUNTER — Ambulatory Visit (INDEPENDENT_AMBULATORY_CARE_PROVIDER_SITE_OTHER): Payer: Medicare Other | Admitting: Family Medicine

## 2019-12-26 VITALS — BP 115/77 | HR 66 | Ht 71.0 in | Wt 183.0 lb

## 2019-12-26 DIAGNOSIS — N183 Chronic kidney disease, stage 3 unspecified: Secondary | ICD-10-CM

## 2019-12-26 DIAGNOSIS — R339 Retention of urine, unspecified: Secondary | ICD-10-CM

## 2019-12-26 DIAGNOSIS — I480 Paroxysmal atrial fibrillation: Secondary | ICD-10-CM

## 2019-12-26 DIAGNOSIS — N1831 Chronic kidney disease, stage 3a: Secondary | ICD-10-CM

## 2019-12-26 DIAGNOSIS — I129 Hypertensive chronic kidney disease with stage 1 through stage 4 chronic kidney disease, or unspecified chronic kidney disease: Secondary | ICD-10-CM | POA: Diagnosis not present

## 2019-12-26 DIAGNOSIS — E038 Other specified hypothyroidism: Secondary | ICD-10-CM

## 2019-12-26 DIAGNOSIS — N401 Enlarged prostate with lower urinary tract symptoms: Secondary | ICD-10-CM

## 2019-12-26 DIAGNOSIS — Z7901 Long term (current) use of anticoagulants: Secondary | ICD-10-CM | POA: Diagnosis not present

## 2019-12-26 DIAGNOSIS — R7309 Other abnormal glucose: Secondary | ICD-10-CM

## 2019-12-26 DIAGNOSIS — R972 Elevated prostate specific antigen [PSA]: Secondary | ICD-10-CM

## 2019-12-26 DIAGNOSIS — N138 Other obstructive and reflux uropathy: Secondary | ICD-10-CM

## 2019-12-26 DIAGNOSIS — Z1159 Encounter for screening for other viral diseases: Secondary | ICD-10-CM

## 2019-12-26 DIAGNOSIS — E782 Mixed hyperlipidemia: Secondary | ICD-10-CM

## 2019-12-26 NOTE — Progress Notes (Signed)
Virtual Visit via Telephone The purpose of this virtual visit is to provide medical care while limiting exposure to the novel coronavirus (COVID19) for both patient and office staff.  Consent was obtained for phone visit:  Yes.   Answered questions that patient had about telehealth interaction:  Yes.   I discussed the limitations, risks, security and privacy concerns of performing an evaluation and management service by telephone. I also discussed with the patient that there may be a patient responsible charge related to this service. The patient expressed understanding and agreed to proceed.  Patient Location: Home Provider Location: [facility])  ---------------------------------------------------------------------- Chief Complaint  Patient presents with  . Hypothyroidism    S: Reviewed CMA documentation. I have called patient and gathered additional HPI as follows:  CHRONIC HTN/ CKD-III Paroxysmal Atrial Fibrillation Previous update from Cardiology - he was on short term xarelto with samples in past but then was going to switch to Eliquis for chronic anticoag but then did not start due to higher cost >$400 has next apt with Cardiology [facility] in 01/15/20 Last lab Creatinine maintained level 1.2-1.3 range Checks BP often at home, low normal readings in morning. Current Meds -Lisinopril 2.5mg  daily and has Metoprolol 25mg  BID PRN ONLY Afib -taking ASA 81mg  daily Reports good compliance, took meds today. Tolerating well, w/o complaints. Denies CP, dyspnea, HA, edema, dizziness / lightheadedness, bleeding problem or bruising  History of Central Hypothyroidism - Followed previously by [facility] Endocrinology Dr Gabriel Carina back in 2018 He had Low TSH, Low T3, and Low-normal T4, and low-normal update on thyroid scan. Other hormones pituitary normal. He was taken off of low dose Levothyroxine. - Last lab done 11/2019 - normal Thyroid panel Free T4 TSH. He remains off  Levothyroxine. - Today doing well no new concerns.  Elevated PSA / BPH Followed by Urology. Prior PSA history elevated in 4 range, has been stable. He has continued Tamsulosin 0.4mg  daily Next apt with [facility] 12/2019 Last updates had MRI imaging of prostate unremarkable.   Denies any high risk travel to areas of current concern for COVID19. Denies any known or suspected exposure to person with or possibly with COVID19.  Denies any fevers, chills, sweats, body ache, cough, shortness of breath, sinus pain or pressure, headache, abdominal pain, diarrhea  Past Medical History:  Diagnosis Date  . Asthma   . Essential hypertension   . Hyperlipidemia   . PAF (paroxysmal atrial fibrillation) (HCC)    a. CHA2DS2VASc = 2;  b. takes amio/eliquis/metoprolol prn for PAF.   Social History   Tobacco Use  . Smoking status: Former Smoker    Packs/day: 1.00    Years: 20.00    Pack years: 20.00    Types: Cigarettes    Quit date: 02/01/1993    Years since quitting: 26.9  . Smokeless tobacco: Never Used  Substance Use Topics  . Alcohol use: Yes    Alcohol/week: 0.0 standard drinks    Comment: social drinker.  . Drug use: No    Current Outpatient Medications:  .  aspirin EC 81 MG tablet, Take 81 mg by mouth daily., Disp: , Rfl:  .  clobetasol ointment (TEMOVATE) 0.05 %, APPLY TO AFFECTED AREA EVERY DAY AS NEEDED, Disp: 30 g, Rfl: 2 .  lisinopril (ZESTRIL) 2.5 MG tablet, TAKE 1 TABLET BY MOUTH EVERY DAY, Disp: 90 tablet, Rfl: 0 .  Multiple Vitamin (MULTIVITAMIN) capsule, Take 1 capsule by mouth daily., Disp: , Rfl:  .  rosuvastatin (CRESTOR) 20  MG tablet, TAKE 1 TABLET (20 MG TOTAL) BY MOUTH DAILY., Disp: 90 tablet, Rfl: 2 .  sildenafil (REVATIO) 20 MG tablet, 3-5 po tablets daily as needed, Disp: , Rfl:  .  tamsulosin (FLOMAX) 0.4 MG CAPS capsule, Take 1 capsule (0.4 mg total) by mouth daily., Disp: 30 capsule, Rfl: 2  Depression screen [facility] 2/9 12/26/2019 10/13/2019 06/22/2019   Decreased Interest 0 0 0  Down, Depressed, Hopeless 0 0 0  PHQ - 2 Score 0 0 0    No flowsheet data found.  -------------------------------------------------------------------------- O: No physical exam performed due to remote telephone encounter.  Lab results reviewed.  Recent Results (from the past 2160 hour(s))  Urine Culture     Status: None   Collection Time: 10/13/19 11:18 AM   Specimen: Urine  Result Value Ref Range   MICRO NUMBER: [id number]    SPECIMEN QUALITY: Adequate    Sample Source URINE    STATUS: FINAL    Result: No Growth   POCT urinalysis dipstick     Status: Normal   Collection Time: 10/13/19  2:05 PM  Result Value Ref Range   Color, UA amber    Clarity, UA clear    Glucose, UA Negative Negative   Bilirubin, UA negative    Ketones, UA negative    Spec Grav, UA 1.010 1.010 - 1.025   Blood, UA negative    pH, UA 5.0 5.0 - 8.0   Protein, UA Negative Negative   Urobilinogen, UA 0.2 0.2 or 1.0 E.U./dL   Nitrite, UA negative    Leukocytes, UA Negative Negative   Appearance     Odor    BASIC METABOLIC PANEL WITH GFR     Status: Abnormal   Collection Time: 12/19/19  8:00 AM  Result Value Ref Range   Glucose, Bld 99 65 - 99 mg/dL    Comment: .            Fasting reference interval .    BUN 15 7 - 25 mg/dL   Creat 1.29 (H) 0.70 - 1.18 mg/dL    Comment: For patients >55 years of age, the reference limit for Creatinine is approximately 13% higher for people identified as African-American. .    GFR, Est Non African American 55 (L) > OR = 60 mL/min/1.70m2   GFR, Est African American 64 > OR = 60 mL/min/1.27m2   BUN/Creatinine Ratio 12 6 - 22 (calc)   Sodium 140 135 - 146 mmol/L   Potassium 4.0 3.5 - 5.3 mmol/L   Chloride 106 98 - 110 mmol/L   CO2 29 20 - 32 mmol/L   Calcium 9.1 8.6 - 10.3 mg/dL  T4, free     Status: None   Collection Time: 12/19/19  8:00 AM  Result Value Ref Range   Free T4 0.9 0.8 - 1.8 ng/dL  TSH     Status: None   Collection  Time: 12/19/19  8:00 AM  Result Value Ref Range   TSH 0.93 0.40 - 4.50 mIU/L    -------------------------------------------------------------------------- A&P:  Problem List Items Addressed This Visit    Paroxysmal atrial fibrillation (HCC)    Stable, controlled Paroxysmal Atrial Fibrillation, >25 yr Followed by Cardiology Dr Rockey Situ On ASA 81mg  daily OFF Anticoagulation due to cost/coverage - he would like to reconsider Eliquis, also may warrant patience assistance program or application if needed      Incomplete bladder emptying   Relevant Medications   tamsulosin (FLOMAX) 0.4 MG CAPS capsule   Chronic  anticoagulation    See A&P AFib Return to [facility] 12/2019, rediscuss Eliquis, may warrant financial assistance to remain on med      Benign localized hyperplasia of prostate with urinary obstruction    BPH controlled on alpha blocker Followed by [facility]      Relevant Medications   tamsulosin (FLOMAX) 0.4 MG CAPS capsule   Benign hypertension with CKD (chronic kidney disease) stage III ([place]) - Primary    Well-controlled HTN Creatinine lab stable 1.29 - Home BP readings stable  Complication CKD-II, PAFib, now off anticoag    Plan:  1. Continue current BP regimen - Lisinopril 2.5mg  for renal protection 2. Encourage improved lifestyle - low sodium diet, regular exercise 3. Continue monitor BP outside office, bring readings to next visit, if persistently >140/90 or new symptoms notify office sooner         No orders of the defined types were placed in this encounter.   Follow-up: - Return in 6 months for Annual Physical - Future labs ordered for 06/2020  Patient verbalizes understanding with the above medical recommendations including the limitation of remote medical advice.  Specific follow-up and call-back criteria were given for patient to follow-up or seek medical care more urgently if needed.   - Time spent in direct consultation with patient on  phone: 10 minutes   [name], [facility] 12/26/2019, 8:49 AM

## 2019-12-26 NOTE — Assessment & Plan Note (Signed)
See A&P AFib Return to [facility] 12/2019, rediscuss Eliquis, may warrant financial assistance to remain on med

## 2019-12-26 NOTE — Assessment & Plan Note (Signed)
BPH controlled on alpha blocker Followed by [facility] 

## 2019-12-26 NOTE — Assessment & Plan Note (Signed)
Stable, controlled Paroxysmal Atrial Fibrillation, >25 yr Followed by Cardiology Dr Rockey Situ On ASA 81mg  daily OFF Anticoagulation due to cost/coverage - he would like to reconsider Eliquis, also may warrant patience assistance program or application if needed

## 2019-12-26 NOTE — Patient Instructions (Addendum)
Follow up with specialists in January 2021  Ask about Eliquis and any financial assistance program if available. Let us know if you need help.  Ask Urologist about Tamsulosin and Sildenafil also for PSA blood test  DUE for FASTING BLOOD WORK (no food or drink after midnight before the lab appointment, only water or coffee without cream/sugar on the morning of)  SCHEDULE "Lab Only" visit in the morning at the clinic for lab draw in 6 MONTHS   - Make sure Lab Only appointment is at about 1 week before your next appointment, so that results will be available  For Lab Results, once available within 2-3 days of blood draw, you can can log in to MyChart online to view your results and a brief explanation. Also, we can discuss results at next follow-up visit.   Please schedule a Follow-up Appointment to: Return in about 6 months (around 06/24/2020) for Annual Physical.  If you have any other questions or concerns, please feel free to call the office or send a message through [place]. You may also schedule an earlier appointment if necessary.  Additionally, you may be receiving a survey about your experience at our office within a few days to 1 week by e-mail or mail. We value your feedback.  [name], DO [place]

## 2019-12-26 NOTE — Assessment & Plan Note (Signed)
Well-controlled HTN Creatinine lab stable 1.29 - Home BP readings stable  Complication CKD-II, PAFib, now off anticoag    Plan:  1. Continue current BP regimen - Lisinopril 2.5mg  for renal protection 2. Encourage improved lifestyle - low sodium diet, regular exercise 3. Continue monitor BP outside office, bring readings to next visit, if persistently >140/90 or new symptoms notify office sooner

## 2019-12-26 NOTE — Addendum Note (Signed)
Addended by: Olin Hauser on: 12/26/2019 09:09 AM   Modules accepted: Orders

## 2020-01-02 ENCOUNTER — Encounter: Payer: Self-pay | Admitting: Urology

## 2020-01-02 ENCOUNTER — Other Ambulatory Visit: Payer: Self-pay

## 2020-01-02 ENCOUNTER — Ambulatory Visit: Payer: Medicare Other | Admitting: Urology

## 2020-01-02 VITALS — BP 120/78 | HR 94 | Ht 72.0 in | Wt 180.0 lb

## 2020-01-02 DIAGNOSIS — N5203 Combined arterial insufficiency and corporo-venous occlusive erectile dysfunction: Secondary | ICD-10-CM

## 2020-01-02 DIAGNOSIS — N138 Other obstructive and reflux uropathy: Secondary | ICD-10-CM

## 2020-01-02 DIAGNOSIS — N401 Enlarged prostate with lower urinary tract symptoms: Secondary | ICD-10-CM

## 2020-01-02 DIAGNOSIS — Z87898 Personal history of other specified conditions: Secondary | ICD-10-CM | POA: Diagnosis not present

## 2020-01-02 DIAGNOSIS — N433 Hydrocele, unspecified: Secondary | ICD-10-CM | POA: Diagnosis not present

## 2020-01-02 LAB — URINALYSIS, COMPLETE
Bilirubin, UA: NEGATIVE
Ketones, UA: NEGATIVE
Leukocytes,UA: NEGATIVE
Nitrite, UA: NEGATIVE
Protein,UA: NEGATIVE
RBC, UA: NEGATIVE
Specific Gravity, UA: >1.03 — ABNORMAL HIGH (ref 1.005–1.030)
Urobilinogen, Ur: 0.2 mg/dL (ref 0.2–1.0)
pH, UA: 5.5 (ref 5.0–7.5)

## 2020-01-02 LAB — MICROSCOPIC EXAMINATION
Bacteria, UA: NONE SEEN
Epithelial Cells (non renal): NONE SEEN /hpf (ref 0–10)
RBC, Urine: NONE SEEN /hpf (ref 0–2)

## 2020-01-02 LAB — BLADDER SCAN AMB NON-IMAGING: Scan Result: 10

## 2020-01-02 NOTE — Progress Notes (Signed)
01/02/2020 11:46 AM   Andrew Benson 1946-04-22 [id number]  Referring provider: Olin Hauser, DO [address] [place],  [place] [address]  Chief Complaint  Patient presents with  . Benign Prostatic Hypertrophy    HPI: 74 year old male who presents today for further evaluation of BPH/history of elevated PSA/ ED.  He is previously followed by Dr. Sharl Ma urology.  He has a personal history of erectile dysfunction treated with sildenafil as needed up to 100 mg.  This is been working fairly well for him.  No side effects.  He is requesting refill of this today.  Personal history of elevated PSA.  Is a has been fluctuating in the 2-5 range over the past several years.  Most recent PSA was 4.34 on 07/2018.  Prostatic volume 101 g based on estimated size on MRI.  MRI was completed on 01/26/2019.  There was evidence of moderate BPH with a significant median lobe but no other suspicious lesions.  He reports that fairly recently, he had episode where he had difficulty voiding, frequency, weak stream, sensation of incomplete emptying and some mild dysuria.  Urinalysis negative.  Still very bothersome to him.  No identifiable alleviating or exacerbating symptoms.  He is started on Flomax by his primary care physician also started a supplement.  These helped within a few days and his symptoms are now well controlled.  He also mentions today that he has an asymptomatic left hydrocele.  This is not bothersome to him although very rarely about some dull aching.  He is not interested in surgical intervention for this.  He also has a personal history of circumcision around age 37.  He occasionally have tearing of what sounds like his frenulum for which he uses clobetasol ointment.  He does not need a refill of this.  Currently he is not having any issues.   IPSS    Row Name 01/02/20 0800         International Prostate Symptom Score   How often have you had the sensation of not emptying your  bladder?  Not at All     How often have you had to urinate less than every two hours?  Less than 1 in 5 times     How often have you found you stopped and started again several times when you urinated?  Less than 1 in 5 times     How often have you found it difficult to postpone urination?  Less than 1 in 5 times     How often have you had a weak urinary stream?  Not at All     How often have you had to strain to start urination?  Not at All     How many times did you typically get up at night to urinate?  1 Time     Total IPSS Score  4       Quality of Life due to urinary symptoms   If you were to spend the rest of your life with your urinary condition just the way it is now how would you feel about that?  Mostly Satisfied        Score:  1-7 Mild 8-19 Moderate 20-35 Severe   PMH: Past Medical History:  Diagnosis Date  . Asthma   . Essential hypertension   . Hyperlipidemia   . PAF (paroxysmal atrial fibrillation) (HCC)    a. CHA2DS2VASc = 2;  b. takes amio/eliquis/metoprolol prn for PAF.    Surgical History:  Past Surgical History:  Procedure Laterality Date  . COLONOSCOPY WITH PROPOFOL N/A 12/29/2017   Procedure: COLONOSCOPY WITH PROPOFOL;  Surgeon: Manya Silvas, MD;  Location: [facility];  Service: Endoscopy;  Laterality: N/A;  . FINGER SURGERY     left index finger  . HERNIA REPAIR    . INGUINAL HERNIA REPAIR    . KNEE SURGERY     left     Home Medications:  Allergies as of 01/02/2020      Reactions   Penicillins    Rash Rash      Medication List       Accurate as of January 02, 2020 11:46 AM. If you have any questions, ask your nurse or doctor.        aspirin EC 81 MG tablet Take 81 mg by mouth daily.   clobetasol ointment 0.05 % Commonly known as: TEMOVATE APPLY TO AFFECTED AREA EVERY DAY AS NEEDED   lisinopril 2.5 MG tablet Commonly known as: ZESTRIL TAKE 1 TABLET BY MOUTH EVERY DAY   multivitamin capsule Take 1 capsule by mouth  daily.   rosuvastatin 20 MG tablet Commonly known as: CRESTOR TAKE 1 TABLET (20 MG TOTAL) BY MOUTH DAILY.   sildenafil 20 MG tablet Commonly known as: REVATIO 3-5 po tablets daily as needed   tamsulosin 0.4 MG Caps capsule Commonly known as: FLOMAX Take 1 capsule (0.4 mg total) by mouth daily.       Allergies:  Allergies  Allergen Reactions  . Penicillins     Rash Rash     Family History: Family History  Problem Relation Age of Onset  . Hyperlipidemia Mother   . Hypertension Mother   . Heart disease Mother   . Heart disease Sister        valve repair   . Diabetes Brother   . Hypertension Brother     Social History:  reports that he quit smoking about 26 years ago. His smoking use included cigarettes. He has a 20.00 pack-year smoking history. He has never used smokeless tobacco. He reports current alcohol use. He reports that he does not use drugs.  ROS: UROLOGY Frequent Urination?: No Hard to postpone urination?: No Burning/pain with urination?: No Get up at night to urinate?: Yes Leakage of urine?: No Urine stream starts and stops?: No Trouble starting stream?: No Do you have to strain to urinate?: No Blood in urine?: No Urinary tract infection?: No Sexually transmitted disease?: No Injury to kidneys or bladder?: No Painful intercourse?: No Weak stream?: No Erection problems?: No Penile pain?: No  Gastrointestinal Nausea?: No Vomiting?: No Indigestion/heartburn?: No Diarrhea?: No Constipation?: No  Constitutional Fever: No Night sweats?: No Weight loss?: No Fatigue?: No  Skin Skin rash/lesions?: No Itching?: No  Eyes Blurred vision?: No Double vision?: No  Ears/Nose/Throat Sore throat?: No Sinus problems?: No  Hematologic/Lymphatic Swollen glands?: No Easy bruising?: No  Cardiovascular Leg swelling?: No Chest pain?: No  Respiratory Cough?: No Shortness of breath?: No  Endocrine Excessive thirst?:  No  Musculoskeletal Back pain?: No Joint pain?: No  Neurological Headaches?: No Dizziness?: No  Psychologic Depression?: No Anxiety?: No  Physical Exam: BP 120/78   Pulse 94   Ht 6' (1.829 m)   Wt 180 lb (81.6 kg)   BMI 24.41 kg/m   Constitutional:  Alert and oriented, No acute distress. HEENT: [place] AT, moist mucus membranes.  Trachea midline, no masses. Cardiovascular: No clubbing, cyanosis, or edema. Respiratory: Normal respiratory effort, no increased work of breathing.  GI: Abdomen is soft, nontender, nondistended, no abdominal masses GU: Circumcised phallus with orthotopic meatus.  Vitiligo type appearance of penile glans.  Left limb incised hydrocele.  Right testicle normal.  Left testicle nonpalpable. Rectal: Normal sphincter tone.  50+ cc prostate, nontender, no nodules. Skin: No rashes, bruises or suspicious lesions. Neurologic: Grossly intact, no focal deficits, moving all 4 extremities. Psychiatric: Normal mood and affect.  Laboratory Data: Lab Results  Component Value Date   WBC 4.4 06/15/2019   HGB 14.6 06/15/2019   HCT 43.2 06/15/2019   MCV 90.4 06/15/2019   PLT 137 (L) 06/15/2019    Lab Results  Component Value Date   CREATININE 1.29 (H) 12/19/2019    Lab Results  Component Value Date   HGBA1C 5.4 06/15/2019    Urinalysis UA negative, see epic  Pertinent Imaging: Results for orders placed or performed in visit on 01/02/20  Bladder Scan (Post Void Residual) in office  Result Value Ref Range   Scan Result 10     Assessment & Plan:    1. Benign localized hyperplasia of prostate with urinary obstruction Symptoms now well controlled on Flomax started by PCP  We discussed the pathophysiology of bladder outlet obstruction as well as the changes of the bladder associated with chronic outlet obstruction.  We discussed various treatment options for BPH including consideration of outlet procedure versus continued pharmacotherapy which will likely  be chronic/lifelong.  Risk and benefits of each were discussed.  Based on his overall prostatic volume of around 100 g, he may be a good candidate for holmium laser enucleation of the prostate.  We discussed this procedure and literature was given.  In light of COVID-19 pandemic, elective surgeries are being held.  He also does not think that this the optimal time for surgery but may consider this down the road.  We will reassess in 6 months.  All questions answered.  In the interim continue Flomax and return if symptoms worsen  - Urinalysis, Complete - Bladder Scan (Post Void Residual) in office  2. History of elevated PSA Personal history of fluctuating/elevated PSA  Based on adjusted prostatic volume, his PSA is likely appropriate.  MRI was reassuring.  Rectal exam today unremarkable.  PSA updated today.  We will continue prostate cancer screening given his relatively good health and life expectancy greater than 10 years. - PSA  3. Combined arterial insufficiency and corporo-venous occlusive erectile dysfunction Continue sildenafil as needed  4. Left hydrocele Appreciated on exam today, minimally symptomatic.  We discussed options including continued conservative management versus surgical intervention.  He is not interested in surgery at this time.   Return in about 6 months (around 07/01/2020) for IPSS/ PVR/ discuss possible outlet surgery.  Hollice Espy, MD  [facility] [address] [place], [place] [address] [phone]

## 2020-01-02 NOTE — Patient Instructions (Signed)

## 2020-01-03 LAB — PSA: Prostate Specific Ag, Serum: 5.7 ng/mL — ABNORMAL HIGH (ref 0.0–4.0)

## 2020-01-04 ENCOUNTER — Other Ambulatory Visit: Payer: Self-pay | Admitting: Family Medicine

## 2020-01-04 ENCOUNTER — Encounter: Payer: Self-pay | Admitting: Urology

## 2020-01-04 DIAGNOSIS — N138 Other obstructive and reflux uropathy: Secondary | ICD-10-CM

## 2020-01-04 DIAGNOSIS — R339 Retention of urine, unspecified: Secondary | ICD-10-CM

## 2020-01-04 MED ORDER — SILDENAFIL CITRATE 20 MG PO TABS: ORAL_TABLET | ORAL | 3 refills | Status: DC

## 2020-01-05 ENCOUNTER — Telehealth: Payer: Self-pay | Admitting: Family Medicine

## 2020-01-05 NOTE — Chronic Care Management (AMB) (Signed)
  Chronic Care Management   Note  01/05/2020 Name: Pharaoh Pio MRN: [id number] DOB: Jul 22, 1946  [name] is a 74 y.o. year old male who is a primary care patient of Olin Hauser, DO. I reached out to [employer] by phone today in response to a referral sent by Mr. Ziere Docken health plan.     Mr. Lessley was given information about Chronic Care Management services today including:  1. CCM service includes personalized support from designated clinical staff supervised by his physician, including individualized plan of care and coordination with other care providers 2. 24/7 contact phone numbers for assistance for urgent and routine care needs. 3. Service will only be billed when office clinical staff spend 20 minutes or more in a month to coordinate care. 4. Only one practitioner may furnish and bill the service in a calendar month. 5. The patient may stop CCM services at any time (effective at the end of the month) by phone call to the office staff. 6. The patient will be responsible for cost sharing (co-pay) of up to 20% of the service fee (after annual deductible is met).  Patient did not agree to enrollment in care management services and does not wish to consider at this time.  Follow up plan: The patient has been provided with contact information for the care management team and has been advised to call with any health related questions or concerns.   [place],  [id number] Direct Dial: [email or web address]  Website: [email or web address]

## 2020-01-08 ENCOUNTER — Telehealth: Payer: Self-pay | Admitting: *Deleted

## 2020-01-08 NOTE — Telephone Encounter (Addendum)
Patient informed, lab appointment scheduled. Patient voiced understanding.   ----- Message from Hollice Espy, MD sent at 01/05/2020 12:06 PM EST ----- PSA up again but seems to be fluctuating around previous baseline.  Let us plan to recheck this again at your follow-up in 6 months to ensure stability.  Please make lab visit a few days before his visit for blood draw.  Hollice Espy, MD

## 2020-01-09 ENCOUNTER — Encounter: Payer: Self-pay | Admitting: Urology

## 2020-01-11 MED ORDER — SILDENAFIL CITRATE 20 MG PO TABS: ORAL_TABLET | ORAL | 3 refills | Status: DC

## 2020-01-14 NOTE — Progress Notes (Signed)
Cardiology Office Note  Date:  01/15/2020   ID:  Andrew Benson, Andrew Benson 07/09/1946, MRN [id number]  PCP:  Andrew Hauser, DO   Chief Complaint  Patient presents with  . other    12 month follow up. Meds reviewed by the pt. verbally. Pt. c/o some spells of chest pain.     HPI:  Andrew Benson is a pleasant 74 year old gentleman with remote history of  atrial fibrillation 25 years ago,  remote history of smoking for 20 years,  atrial fib 10/26/13  atrial fibrillation while he was on his tractor in 2015, developed acute onset of palpitations, presented to the emergency room  2019 episode in [place], 20 min who presents for routine followup for atrial fibrillation.   On his last clinic visit reported having difficulty with the cost of Eliquis We recommended he try Xarelto He reported that he had patient assistance in April 2020 but we had not received the paperwork On today's visit appears he is not taking anticoagulation  Followed by urology, PSA elevated  No recent hospital visits  Goes to church, unloads food, panty  Denies having recent episodes of atrial fibrillation  Otherwise reports he is exercising on a regular basis, no shortness of breath or chest pain on exertion Watching his diet  EKG personally reviewed by myself on todays visit Showing normal sinus rhythm rate 61 bpm    On last office visit in early 2019 had been in [place] visiting family, large coffee  developed profound tachycardia rate up to 200 bpm Lasted approximately 20 minutes before terminating Towards the tail end of the arrhythmia took metoprolol possibly amiodarone  At that time reported having paroxysmal episodes of tachycardia rate up to 130s at times   chads VASC  score of 2, age, blood pressure   on Crestor 20 mg daily  Total chol 130, LDL 73  EKG personally reviewed by myself on todays visit Shows normal sinus rhythm rate 94 bpm no significant ST or T wave changes   PMH:   has a  past medical history of Asthma, Essential hypertension, Hyperlipidemia, and PAF (paroxysmal atrial fibrillation) ([place]).  PSH:    Past Surgical History:  Procedure Laterality Date  . COLONOSCOPY WITH PROPOFOL N/A 12/29/2017   Procedure: COLONOSCOPY WITH PROPOFOL;  Surgeon: Manya Silvas, MD;  [facility]: [facility] ENDOSCOPY;  Service: Endoscopy;  Laterality: N/A;  . FINGER SURGERY     left index finger  . HERNIA REPAIR    . INGUINAL HERNIA REPAIR    . KNEE SURGERY     left     Current Outpatient Medications  Medication Sig Dispense Refill  . clobetasol ointment (TEMOVATE) 0.05 % APPLY TO AFFECTED AREA EVERY DAY AS NEEDED 30 g 2  . lisinopril (ZESTRIL) 2.5 MG tablet TAKE 1 TABLET BY MOUTH EVERY DAY 90 tablet 0  . Multiple Vitamin (MULTIVITAMIN) capsule Take 1 capsule by mouth daily.    . rosuvastatin (CRESTOR) 20 MG tablet TAKE 1 TABLET (20 MG TOTAL) BY MOUTH DAILY. 90 tablet 2  . sildenafil (REVATIO) 20 MG tablet 3-5 po tablets daily as needed 90 tablet 3  . tamsulosin (FLOMAX) 0.4 MG CAPS capsule TAKE 1 CAPSULE BY MOUTH EVERY DAY 90 capsule 1  . apixaban (ELIQUIS) 5 MG TABS tablet Take 1 tablet (5 mg total) by mouth 2 (two) times daily. 60 tablet 11   No current facility-administered medications for this visit.     Allergies:   Penicillins   Social History:  The patient  reports that he quit smoking about 26 years ago. His smoking use included cigarettes. He has a 20.00 pack-year smoking history. He has never used smokeless tobacco. He reports current alcohol use. He reports that he does not use drugs.   Family History:   family history includes Diabetes in his brother; Heart disease in his mother and sister; Hyperlipidemia in his mother; Hypertension in his brother and mother.    Review of Systems: Review of Systems  Constitutional: Negative.   Respiratory: Negative.   Cardiovascular: Positive for palpitations.  Gastrointestinal: Negative.   Musculoskeletal: Negative.    Neurological: Negative.   Psychiatric/Behavioral: Negative.   All other systems reviewed and are negative.   PHYSICAL EXAM: VS:  BP 120/72 (BP Location: Left Arm, Patient Position: Sitting, Cuff Size: Normal)   Pulse 61   Ht 5\' 11"  (1.803 m)   Wt 185 lb 8 oz (84.1 kg)   BMI 25.87 kg/m  , BMI Body mass index is 25.87 kg/m. Constitutional:  oriented to person, place, and time. No distress.  HENT:  Head: Grossly normal Eyes:  no discharge. No scleral icterus.  Neck: No JVD, no carotid bruits  Cardiovascular: Regular rate and rhythm, no murmurs appreciated Pulmonary/Chest: Clear to auscultation bilaterally, no wheezes or rails Abdominal: Soft.  no distension.  no tenderness.  Musculoskeletal: Normal range of motion Neurological:  normal muscle tone. Coordination normal. No atrophy Skin: Skin warm and dry Psychiatric: normal affect, pleasant   Recent Labs: 06/15/2019: ALT 16; Hemoglobin 14.6; Platelets 137 12/19/2019: BUN 15; Creat 1.29; Potassium 4.0; Sodium 140; TSH 0.93    Lipid Panel Lab Results  Component Value Date   CHOL 146 12/22/2017   HDL 55 12/22/2017   LDLCALC 73 12/22/2017   TRIG 95 12/22/2017      Wt Readings from Last 3 Encounters:  01/15/20 185 lb 8 oz (84.1 kg)  01/02/20 180 lb (81.6 kg)  12/26/19 183 lb (83 kg)      ASSESSMENT AND PLAN:  Paroxysmal atrial fibrillation ([place]) - Plan: EKG 12-Lead CHADS VASC of 2,  Restart Eliquis 5 mg twice daily Taking amiodarone and metoprolol as needed  Mixed hyperlipidemia At goal.   Stable on Crestor Lipids today  Essential hypertension -  BP stable  CKD (chronic kidney disease), stage II Stable Monitored by primary care  Other chest pain musculoskeletal pain   Total encounter time more than 25 minutes  Greater than 50% was spent in counseling and coordination of care with the patient   Disposition:   F/U  12 months   Orders Placed This Encounter  Procedures  . EKG 12-Lead      Signed, Esmond Plants, M.D., Ph.D. 01/15/2020  [place], [place]

## 2020-01-15 ENCOUNTER — Encounter: Payer: Self-pay | Admitting: Cardiovascular Disease

## 2020-01-15 ENCOUNTER — Other Ambulatory Visit: Payer: Self-pay

## 2020-01-15 ENCOUNTER — Ambulatory Visit (INDEPENDENT_AMBULATORY_CARE_PROVIDER_SITE_OTHER): Payer: Medicare Other | Admitting: Cardiovascular Disease

## 2020-01-15 VITALS — BP 120/72 | HR 61 | Ht 71.0 in | Wt 185.5 lb

## 2020-01-15 DIAGNOSIS — Z87891 Personal history of nicotine dependence: Secondary | ICD-10-CM

## 2020-01-15 DIAGNOSIS — I480 Paroxysmal atrial fibrillation: Secondary | ICD-10-CM | POA: Diagnosis not present

## 2020-01-15 DIAGNOSIS — N182 Chronic kidney disease, stage 2 (mild): Secondary | ICD-10-CM | POA: Diagnosis not present

## 2020-01-15 DIAGNOSIS — E782 Mixed hyperlipidemia: Secondary | ICD-10-CM

## 2020-01-15 DIAGNOSIS — I10 Essential (primary) hypertension: Secondary | ICD-10-CM | POA: Diagnosis not present

## 2020-01-15 MED ORDER — AMIODARONE HCL 200 MG PO TABS: 200.0000 mg | ORAL_TABLET | Freq: Two times a day (BID) | ORAL | 0 refills | Status: DC | PRN

## 2020-01-15 MED ORDER — APIXABAN 5 MG PO TABS: 5.0000 mg | ORAL_TABLET | Freq: Two times a day (BID) | ORAL | 11 refills | Status: DC

## 2020-01-15 MED ORDER — METOPROLOL TARTRATE 25 MG PO TABS: 25.0000 mg | ORAL_TABLET | Freq: Two times a day (BID) | ORAL | 0 refills | Status: DC | PRN

## 2020-01-15 NOTE — Patient Instructions (Addendum)
Medication Instructions:  Your physician has recommended you make the following change in your medication:  1. RESTART Eliquis 5 mg twice a day 2. STOP Aspirin  Amiodarone and Metoprolol added back on list as needed for afib.   If you need a refill on your cardiac medications before your next appointment, please call your pharmacy.    Lab work: Lipids today   If you have labs (blood work) drawn today and your tests are completely normal, you will receive your results only by: Marland Kitchen MyChart Message (if you have MyChart) OR . A paper copy in the mail If you have any lab test that is abnormal or we need to change your treatment, we will call you to review the results.   Testing/Procedures: No new testing needed   Follow-Up: At [facility], you and your health needs are our priority.  As part of our continuing mission to provide you with exceptional heart care, we have created designated Provider Care Teams.  These Care Teams include your primary Cardiologist (physician) and Advanced Practice Providers (APPs -  Physician Assistants and Nurse Practitioners) who all work together to provide you with the care you need, when you need it.  . You will need a follow up appointment in 12 months   . Providers on your designated Care Team:   . Murray Hodgkins, NP . Christell Faith, PA-C . Marrianne Mood, PA-C  Any Other Special Instructions Will Be Listed Below (If Applicable).  For educational health videos Log in to : [email or web address] Or : [email or web address], password : [email or web address]

## 2020-01-16 LAB — LIPID PANEL
Chol/HDL Ratio: 2.7 ratio (ref 0.0–5.0)
Cholesterol, Total: 147 mg/dL (ref 100–199)
HDL: 54 mg/dL (ref >39)
LDL Chol Calc (NIH): 78 mg/dL (ref 0–99)
Triglycerides: 79 mg/dL (ref 0–149)
VLDL Cholesterol Cal: 15 mg/dL (ref 5–40)

## 2020-01-18 ENCOUNTER — Other Ambulatory Visit: Payer: Self-pay | Admitting: Cardiovascular Disease

## 2020-01-18 DIAGNOSIS — E785 Hyperlipidemia, unspecified: Secondary | ICD-10-CM

## 2020-03-17 ENCOUNTER — Other Ambulatory Visit: Payer: Self-pay | Admitting: Cardiovascular Disease

## 2020-03-17 DIAGNOSIS — I10 Essential (primary) hypertension: Secondary | ICD-10-CM

## 2020-04-07 ENCOUNTER — Other Ambulatory Visit: Payer: Self-pay | Admitting: Cardiovascular Disease

## 2020-05-30 ENCOUNTER — Encounter: Payer: Medicare Other | Admitting: Family Medicine

## 2020-06-25 ENCOUNTER — Other Ambulatory Visit: Payer: Self-pay | Admitting: *Deleted

## 2020-06-25 DIAGNOSIS — N138 Other obstructive and reflux uropathy: Secondary | ICD-10-CM

## 2020-07-01 ENCOUNTER — Other Ambulatory Visit: Payer: Medicare Other

## 2020-07-01 ENCOUNTER — Encounter: Payer: Self-pay | Admitting: Urology

## 2020-07-02 ENCOUNTER — Other Ambulatory Visit
Admission: RE | Admit: 2020-07-02 | Discharge: 2020-07-02 | Disposition: A | Discharge status: Home / Self Care | Payer: Medicare Other | Attending: Urology | Admitting: Urology

## 2020-07-02 DIAGNOSIS — N401 Enlarged prostate with lower urinary tract symptoms: Secondary | ICD-10-CM | POA: Insufficient documentation

## 2020-07-02 DIAGNOSIS — N138 Other obstructive and reflux uropathy: Secondary | ICD-10-CM | POA: Diagnosis not present

## 2020-07-02 LAB — PSA: Prostatic Specific Antigen: 3.6 ng/mL (ref 0.00–4.00)

## 2020-07-04 ENCOUNTER — Ambulatory Visit (INDEPENDENT_AMBULATORY_CARE_PROVIDER_SITE_OTHER): Payer: Medicare Other | Admitting: Urology

## 2020-07-04 ENCOUNTER — Other Ambulatory Visit: Payer: Self-pay | Admitting: Family Medicine

## 2020-07-04 ENCOUNTER — Other Ambulatory Visit: Payer: Self-pay

## 2020-07-04 VITALS — BP 141/88 | HR 82 | Ht 71.0 in | Wt 178.0 lb

## 2020-07-04 DIAGNOSIS — N138 Other obstructive and reflux uropathy: Secondary | ICD-10-CM

## 2020-07-04 DIAGNOSIS — N401 Enlarged prostate with lower urinary tract symptoms: Secondary | ICD-10-CM | POA: Diagnosis not present

## 2020-07-04 DIAGNOSIS — R339 Retention of urine, unspecified: Secondary | ICD-10-CM

## 2020-07-04 LAB — BLADDER SCAN AMB NON-IMAGING: Scan Result: 33

## 2020-07-04 NOTE — Progress Notes (Signed)
07/09/2020 8:43 AM   Andrew Benson 13-Dec-1946 [id number]  Referring provider: Olin Hauser, DO [address] [place],  [place] [address] Chief Complaint  Patient presents with  . Other    HPI: Andrew Benson is a 74 y.o. male with BPH/history of elevated PSA/ ED presents for routine 6 month f/u.  Personal history of elevated PSA.  Is a has been fluctuating in the 2-5 range over the past several years.  Most recent PSA was 4.34 on 07/2018.  Prostatic volume 101 g based on estimated size on MRI.  MRI was completed on 01/26/2019.  There was evidence of moderate BPH with a significant median lobe but no other suspicious lesions.  PSA was 3.60 as of 07/02/2020.   He also has a personal history of circumcision around age 57.  He occasionally have tearing of what sounds like his frenulum for which he uses clobetasol ointment.  He brings this up again today of an area of concern.  Urinary symptoms are stable today.  He was on Flomax is helping him significantly.  He is on saw palmetto as well as a supplement which he believes is also effective. IPSS was 5, and PVR was 33 mL on 07/04/2020.   He mentions that his hydrocele is enlarging but is not bother him.  He is also worried today about his renal function.  He is on my chart that is creatinine is about 1.3 and a diagnosis of a stage IIIa CKD.      PMH: Past Medical History:  Diagnosis Date  . Asthma   . Essential hypertension   . Hyperlipidemia   . PAF (paroxysmal atrial fibrillation) (HCC)    a. CHA2DS2VASc = 2;  b. takes amio/eliquis/metoprolol prn for PAF.    Surgical History: Past Surgical History:  Procedure Laterality Date  . COLONOSCOPY WITH PROPOFOL N/A 12/29/2017   Procedure: COLONOSCOPY WITH PROPOFOL;  Surgeon: Manya Silvas, MD;  Location: [facility] ENDOSCOPY;  Service: Endoscopy;  Laterality: N/A;  . FINGER SURGERY     left index finger  . HERNIA REPAIR    . INGUINAL HERNIA REPAIR    . KNEE SURGERY     left       Home Medications:  Allergies as of 07/09/2020      Reactions   Penicillins    Rash Rash      Medication List       Accurate as of July 09, 2020  8:43 AM. If you have any questions, ask your nurse or doctor.        amiodarone 200 MG tablet Commonly known as: PACERONE TAKE 1 TABLET (200 MG TOTAL) BY MOUTH 2 (TWO) TIMES DAILY AS NEEDED (FOR AFIB).   apixaban 5 MG Tabs tablet Commonly known as: Eliquis Take 1 tablet (5 mg total) by mouth 2 (two) times daily.   clobetasol ointment 0.05 % Commonly known as: TEMOVATE APPLY TO AFFECTED AREA EVERY DAY AS NEEDED   lisinopril 2.5 MG tablet Commonly known as: ZESTRIL TAKE 1 TABLET BY MOUTH EVERY DAY   metoprolol tartrate 25 MG tablet Commonly known as: LOPRESSOR TAKE 1 TABLET (25 MG TOTAL) BY MOUTH 2 (TWO) TIMES DAILY AS NEEDED (FOR AFIB).   multivitamin capsule Take 1 capsule by mouth daily.   rosuvastatin 20 MG tablet Commonly known as: CRESTOR TAKE 1 TABLET BY MOUTH EVERY DAY   sildenafil 20 MG tablet Commonly known as: REVATIO 3-5 po tablets daily as needed   tamsulosin 0.4 MG Caps capsule  Commonly known as: FLOMAX TAKE 1 CAPSULE BY MOUTH EVERY DAY       Allergies:  Allergies  Allergen Reactions  . Penicillins     Rash Rash     Family History: Family History  Problem Relation Age of Onset  . Hyperlipidemia Mother   . Hypertension Mother   . Heart disease Mother   . Heart disease Sister        valve repair   . Diabetes Brother   . Hypertension Brother     Social History:  reports that he quit smoking about 27 years ago. His smoking use included cigarettes. He has a 20.00 pack-year smoking history. He has never used smokeless tobacco. He reports current alcohol use. He reports that he does not use drugs.   Physical Exam: BP 137/76 (BP Location: Left Arm, Patient Position: Sitting, Cuff Size: Normal)   Pulse 92   Ht 5\' 11"  (1.803 m)   Wt 184 lb (83.5 kg)   BMI 25.66 kg/m   Constitutional:   Alert and oriented, No acute distress. HEENT: [place] AT, moist mucus membranes.  Trachea midline, no masses. Cardiovascular: No clubbing, cyanosis, or edema. Respiratory: Normal respiratory effort, no increased work of breathing. GI: Abdomen is soft, nontender, nondistended, no abdominal masses GU: No CVA tenderness. Normal appearing circumcision. Ortho topic meatus. No evidence of frenulum disruption or any other lesions.  Skin: No rashes, bruises or suspicious lesions. Neurologic: Grossly intact, no focal deficits, moving all 4 extremities. Psychiatric: Normal mood and affect.   Assessment & Plan:    1. BPH with outlet obstruction Symptoms are controlled on Flomax / saw palmetto Voiding well with adequate emptying.  PVR was 33 mL on 07/04/2020. IPSS score was 5 as of 07/04/2020. We discussed various treatment options for BPH including consideration of outlet procedure versus continued pharmacotherapy which will likely be chronic/lifelong.  Risk and benefits of each were discussed. Patient declined surgical intervention at this time.   2. History of elevated PSA Personal history of fluctuating/elevated PSA. PSA is trending down and was 3.60 as of 07/02/2020. Continue to monitor PSA annually with DRE.   3. Left hydrocele  Asymptomatic today. No surgical intervention needed at this time unless symptoms become bothersome.   4. Chronic kidney disease, Stage 3a Patient as concerned about his creatinine value on Mychart. I reassured his creatinine has remained stable for 6+ years.  5. Penile pain Exam normal, reassured    Follow up 1 year with PSA/DRE/PVR/IPSS.   [name] [address] [place], [name] [address] [phone]  I, Andrew Benson, am acting as a scribe for Dr. Hollice Espy.  I have reviewed the above documentation for accuracy and completeness, and I agree with the above.   Hollice Espy, MD

## 2020-07-04 NOTE — Progress Notes (Signed)
Patient was unable to be seen and was rescheduled for Tuesday. IPSS was 5, PVR is 33 ML. Information is in chart

## 2020-07-09 ENCOUNTER — Encounter: Payer: Self-pay | Admitting: Urology

## 2020-07-09 ENCOUNTER — Ambulatory Visit: Payer: Medicare Other | Admitting: Urology

## 2020-07-09 ENCOUNTER — Other Ambulatory Visit: Payer: Self-pay

## 2020-07-09 VITALS — BP 137/76 | HR 92 | Ht 71.0 in | Wt 184.0 lb

## 2020-07-09 DIAGNOSIS — Z87898 Personal history of other specified conditions: Secondary | ICD-10-CM

## 2020-08-28 ENCOUNTER — Encounter: Payer: Self-pay | Admitting: Urology

## 2020-08-29 ENCOUNTER — Other Ambulatory Visit: Payer: Self-pay | Admitting: *Deleted

## 2020-08-29 DIAGNOSIS — R7309 Other abnormal glucose: Secondary | ICD-10-CM

## 2020-08-29 DIAGNOSIS — Z1159 Encounter for screening for other viral diseases: Secondary | ICD-10-CM

## 2020-08-29 DIAGNOSIS — I480 Paroxysmal atrial fibrillation: Secondary | ICD-10-CM

## 2020-08-29 DIAGNOSIS — I129 Hypertensive chronic kidney disease with stage 1 through stage 4 chronic kidney disease, or unspecified chronic kidney disease: Secondary | ICD-10-CM

## 2020-08-29 DIAGNOSIS — N183 Chronic kidney disease, stage 3 unspecified: Secondary | ICD-10-CM

## 2020-08-29 DIAGNOSIS — Z7901 Long term (current) use of anticoagulants: Secondary | ICD-10-CM

## 2020-08-29 DIAGNOSIS — E038 Other specified hypothyroidism: Secondary | ICD-10-CM

## 2020-08-29 DIAGNOSIS — E782 Mixed hyperlipidemia: Secondary | ICD-10-CM

## 2020-08-29 DIAGNOSIS — N1831 Chronic kidney disease, stage 3a: Secondary | ICD-10-CM

## 2020-08-30 ENCOUNTER — Other Ambulatory Visit: Payer: Self-pay

## 2020-08-30 ENCOUNTER — Other Ambulatory Visit: Payer: Medicare Other

## 2020-09-02 LAB — COMPLETE METABOLIC PANEL WITH GFR
AG Ratio: 1.7 (calc) (ref 1.0–2.5)
ALT: 10 U/L (ref 9–46)
AST: 19 U/L (ref 10–35)
Albumin: 4 g/dL (ref 3.6–5.1)
Alkaline phosphatase (APISO): 77 U/L (ref 35–144)
BUN/Creatinine Ratio: 13 (calc) (ref 6–22)
BUN: 17 mg/dL (ref 7–25)
CO2: 28 mmol/L (ref 20–32)
Calcium: 9.3 mg/dL (ref 8.6–10.3)
Chloride: 106 mmol/L (ref 98–110)
Creat: 1.33 mg/dL — ABNORMAL HIGH (ref 0.70–1.18)
GFR, Est African American: 61 mL/min/{1.73_m2} (ref >=60)
GFR, Est Non African American: 53 mL/min/{1.73_m2} — ABNORMAL LOW (ref >=60)
Globulin: 2.4 g/dL (calc) (ref 1.9–3.7)
Glucose, Bld: 85 mg/dL (ref 65–99)
Potassium: 3.9 mmol/L (ref 3.5–5.3)
Sodium: 143 mmol/L (ref 135–146)
Total Bilirubin: 1.1 mg/dL (ref 0.2–1.2)
Total Protein: 6.4 g/dL (ref 6.1–8.1)

## 2020-09-02 LAB — CBC WITH DIFFERENTIAL/PLATELET
Absolute Monocytes: 456 cells/uL (ref 200–950)
Basophils Absolute: 38 cells/uL (ref 0–200)
Basophils Relative: 0.8 %
Eosinophils Absolute: 108 cells/uL (ref 15–500)
Eosinophils Relative: 2.3 %
HCT: 45.7 % (ref 38.5–50.0)
Hemoglobin: 15.3 g/dL (ref 13.2–17.1)
Lymphs Abs: 1636 cells/uL (ref 850–3900)
MCH: 30.7 pg (ref 27.0–33.0)
MCHC: 33.5 g/dL (ref 32.0–36.0)
MCV: 91.8 fL (ref 80.0–100.0)
MPV: 10.6 fL (ref 7.5–12.5)
Monocytes Relative: 9.7 %
Neutro Abs: 2463 cells/uL (ref 1500–7800)
Neutrophils Relative %: 52.4 %
Platelets: 135 10*3/uL — ABNORMAL LOW (ref 140–400)
RBC: 4.98 10*6/uL (ref 4.20–5.80)
RDW: 14.3 % (ref 11.0–15.0)
Total Lymphocyte: 34.8 %
WBC: 4.7 10*3/uL (ref 3.8–10.8)

## 2020-09-02 LAB — HEPATITIS C ANTIBODY
Hepatitis C Ab: NONREACTIVE
SIGNAL TO CUT-OFF: 0.02 (ref <1.00)

## 2020-09-02 LAB — LIPID PANEL
Cholesterol: 160 mg/dL (ref <200)
HDL: 61 mg/dL (ref >=40)
LDL Cholesterol (Calc): 85 mg/dL (calc)
Non-HDL Cholesterol (Calc): 99 mg/dL (calc) (ref <130)
Total CHOL/HDL Ratio: 2.6 (calc) (ref <5.0)
Triglycerides: 65 mg/dL (ref <150)

## 2020-09-02 LAB — HEMOGLOBIN A1C
Hgb A1c MFr Bld: 5.3 % of total Hgb (ref <5.7)
Mean Plasma Glucose: 105 (calc)
eAG (mmol/L): 5.8 (calc)

## 2020-09-02 LAB — TSH: TSH: 0.36 mIU/L — ABNORMAL LOW (ref 0.40–4.50)

## 2020-09-02 LAB — T4, FREE: Free T4: 1 ng/dL (ref 0.8–1.8)

## 2020-09-04 ENCOUNTER — Encounter: Payer: Self-pay | Admitting: Family Medicine

## 2020-09-04 ENCOUNTER — Other Ambulatory Visit: Payer: Self-pay

## 2020-09-04 ENCOUNTER — Ambulatory Visit (INDEPENDENT_AMBULATORY_CARE_PROVIDER_SITE_OTHER): Payer: Medicare Other | Admitting: Family Medicine

## 2020-09-04 VITALS — BP 117/88 | HR 77 | Temp 97.3°F | Resp 16 | Ht 71.0 in | Wt 179.0 lb

## 2020-09-04 DIAGNOSIS — N183 Chronic kidney disease, stage 3 unspecified: Secondary | ICD-10-CM

## 2020-09-04 DIAGNOSIS — I129 Hypertensive chronic kidney disease with stage 1 through stage 4 chronic kidney disease, or unspecified chronic kidney disease: Secondary | ICD-10-CM | POA: Diagnosis not present

## 2020-09-04 DIAGNOSIS — N138 Other obstructive and reflux uropathy: Secondary | ICD-10-CM

## 2020-09-04 DIAGNOSIS — E038 Other specified hypothyroidism: Secondary | ICD-10-CM | POA: Diagnosis not present

## 2020-09-04 DIAGNOSIS — Z0000 Encounter for general adult medical examination without abnormal findings: Secondary | ICD-10-CM

## 2020-09-04 DIAGNOSIS — N1831 Chronic kidney disease, stage 3a: Secondary | ICD-10-CM

## 2020-09-04 DIAGNOSIS — I480 Paroxysmal atrial fibrillation: Secondary | ICD-10-CM

## 2020-09-04 DIAGNOSIS — E782 Mixed hyperlipidemia: Secondary | ICD-10-CM

## 2020-09-04 DIAGNOSIS — N401 Enlarged prostate with lower urinary tract symptoms: Secondary | ICD-10-CM

## 2020-09-04 NOTE — Assessment & Plan Note (Signed)
BPH controlled on alpha blocker Followed by [facility]

## 2020-09-04 NOTE — Assessment & Plan Note (Signed)
Clinically stable Remains off levothyroxine Can re-check TSH Free T4 yearly

## 2020-09-04 NOTE — Assessment & Plan Note (Addendum)
Well-controlled HTN Creatinine lab stable 1.3 - Home BP readings stable  Complication CKD-II, PAFib    Plan:  1. Continue current BP regimen - Lisinopril 2.5mg  for renal protection 2. Encourage improved lifestyle - low sodium diet, regular exercise 3. Continue monitor BP outside office, bring readings to next visit, if persistently >140/90 or new symptoms notify office sooner

## 2020-09-04 NOTE — Assessment & Plan Note (Signed)
Stable to improved, GFR < 60 Known HTN On low dose ACEi

## 2020-09-04 NOTE — Assessment & Plan Note (Signed)
Controlled cholesterol on statin lifestyle Last lipid panel 08/2020 The 10-year ASCVD risk score Mikey Bussing DC Jr., et al., 2013) is: 15.9%  Plan: 1. Continue current meds - Rosuvastatin 20mg  2. Encourage improved lifestyle - low carb/cholesterol, reduce portion size, continue improving regular exercise

## 2020-09-04 NOTE — Assessment & Plan Note (Signed)
Stable, controlled Paroxysmal Atrial Fibrillation, >25 yr Followed by Cardiology Dr Rockey Situ Now off ASA back on Eliquis - he will discuss with Cardiology next visit determine if he should continue

## 2020-09-04 NOTE — Patient Instructions (Addendum)
Thank you for coming to the office today.  Please discuss the AFib medications and blood thinner with Dr Rockey Situ.  ----  Please send mychart message with dates of your 1st 2nd doses of Moderna vaccine.  [email or web address]  [email or web address]  ---------------   Please schedule and return for a NURSE ONLY VISIT for VACCINE - Approximately around October November - Need High Dose Flu Vaccine  ---------------------------------  Next year January or February 2022 would be due for Colonoscopy  - let me know when ready to proceed.  DUE for FASTING BLOOD WORK (no food or drink after midnight before the lab appointment, only water or coffee without cream/sugar on the morning of)  SCHEDULE "Lab Only" visit in the morning at the clinic for lab draw in 1 YEAR  - Make sure Lab Only appointment is at about 1 week before your next appointment, so that results will be available  For Lab Results, once available within 2-3 days of blood draw, you can can log in to MyChart online to view your results and a brief explanation. Also, we can discuss results at next follow-up visit.   Please schedule a Follow-up Appointment to: Return in about 1 year (around 09/04/2021) for Annual Physical.  If you have any other questions or concerns, please feel free to call the office or send a message through [place]. You may also schedule an earlier appointment if necessary.  Additionally, you may be receiving a survey about your experience at our office within a few days to 1 week by e-mail or mail. We value your feedback.  [name], DO [place]

## 2020-09-04 NOTE — Progress Notes (Signed)
Subjective:    Patient ID: Andrew Benson, male    DOB: 09-27-1946, 74 y.o.   MRN: [id number]  Andrew Benson is a 74 y.o. male presenting on 09/04/2020 for Hypertension   HPI   CHRONIC HTN/CKD-IIIParoxysmal Atrial Fibrillation He does not take Amiodarone or Metoprolol - only PRN very rarely for Paroxysmal AFib, he has been taking Eliquis very intermittently and not regularly, he plans to discontinue these medicines in future, he will return to Cardiology as scheduled to discuss.   Last lab Creatinine maintained level 1.2-1.3 range Checks BP often at home, low normal readings in morning. Current Meds -Lisinopril 2.5mg  daily and has Metoprolol 25mg  BID PRN ONLY Afib Off ASA Reports good compliance, took meds today. Tolerating well, w/o complaints. Denies CP, dyspnea, HA, edema, dizziness / lightheadedness, bleeding problem or bruising  History of Central Hypothyroidism - [facility]l [facility] 2018He had Low TSH, Low T3, and Low-normal T4, and low-normal update on thyroid scan. Other hormones pituitary normal. He was taken off of low dose Levothyroxine. - Last lab done 08/2020- slight low TSH but normal Free T4. Remains off levothyroxine - Today doing well no new concerns.  Elevated PSA / BPH [facility]  HYPERLIPIDEMIA: - Reports no concerns. Last lipid panel 08/2020, controlled on statin - Currently taking Rosuvastatin 20mg , tolerating well without side effects or myalgias  Thrombocytopenia, chronic Trend of CBC with PLT result 120-140s mild low in past 7 years, he has no bleeding or bruising.   Health Maintenance:  COVID19 vaccine up to date, Moderna in March 2021 - he will send [place] a copy of the dates.  Due for Flu Shot, declines today, defer to return with wife to get later in season   Depression screen [facility] 2/9 09/04/2020 12/26/2019 10/13/2019  Decreased Interest 0 0 0  Down, Depressed, Hopeless 0 0 0  PHQ - 2 Score 0  0 0    Past Medical History:  Diagnosis Date  . Asthma   . Essential hypertension   . Hyperlipidemia   . PAF (paroxysmal atrial fibrillation) (HCC)    a. CHA2DS2VASc = 2;  b. takes amio/eliquis/metoprolol prn for PAF.   Past Surgical History:  Procedure Laterality Date  . COLONOSCOPY WITH PROPOFOL N/A 12/29/2017   Procedure: COLONOSCOPY WITH PROPOFOL;  Surgeon: Manya Silvas, MD;  [facility] ENDOSCOPY;  Service: Endoscopy;  Laterality: N/A;  . FINGER SURGERY     left index finger  . HERNIA REPAIR    . INGUINAL HERNIA REPAIR    . KNEE SURGERY     left    Social History   Socioeconomic History  . Marital status: Married    Spouse name: Not on file  . Number of children: Not on file  . Years of education: Not on file  . Highest education level: Associate degree: academic program  Occupational History  . Not on file  Tobacco Use  . Smoking status: Former Smoker    Packs/day: 1.00    Years: 20.00    Pack years: 20.00    Types: Cigarettes    Quit date: 02/01/1993    Years since quitting: 27.6  . Smokeless tobacco: Never Used  Vaping Use  . Vaping Use: Never used  Substance and Sexual Activity  . Alcohol use: Yes    Alcohol/week: 0.0 standard drinks    Comment: social drinker.  . Drug use: No  . Sexual activity: Not on file  Other Topics Concern  . Not on file  Social History Narrative   Participates in church activities, and food bank 3 days a week   Gym 2 times weekly- treadmill    Social Determinants of Health   Financial Resource Strain:   . Difficulty of Paying Living Expenses: Not on file  Food Insecurity:   . Worried About Charity fundraiser in the Last Year: Not on file  . Ran Out of Food in the Last Year: Not on file  Transportation Needs:   . Lack of Transportation (Medical): Not on file  . Lack of Transportation (Non-Medical): Not on file  Physical Activity:   . Days of Exercise per Week: Not on file  . Minutes of Exercise per Session: Not  on file  Stress:   . Feeling of Stress : Not on file  Social Connections:   . Frequency of Communication with Friends and Family: Not on file  . Frequency of Social Gatherings with Friends and Family: Not on file  . Attends Religious Services: Not on file  . Active Member of Clubs or Organizations: Not on file  . Attends Archivist Meetings: Not on file  . Marital Status: Not on file  Intimate Partner Violence:   . Fear of Current or Ex-Partner: Not on file  . Emotionally Abused: Not on file  . Physically Abused: Not on file  . Sexually Abused: Not on file   Family History  Problem Relation Age of Onset  . Hyperlipidemia Mother   . Hypertension Mother   . Heart disease Mother   . Heart disease Sister        valve repair   . Diabetes Brother   . Hypertension Brother    Current Outpatient Medications on File Prior to Visit  Medication Sig  . apixaban (ELIQUIS) 5 MG TABS tablet Take 1 tablet (5 mg total) by mouth 2 (two) times daily.  . clobetasol ointment (TEMOVATE) 0.05 % APPLY TO AFFECTED AREA EVERY DAY AS NEEDED  . lisinopril (ZESTRIL) 2.5 MG tablet TAKE 1 TABLET BY MOUTH EVERY DAY  . Multiple Vitamin (MULTIVITAMIN) capsule Take 1 capsule by mouth daily.  . rosuvastatin (CRESTOR) 20 MG tablet TAKE 1 TABLET BY MOUTH EVERY DAY  . sildenafil (REVATIO) 20 MG tablet 3-5 po tablets daily as needed  . tamsulosin (FLOMAX) 0.4 MG CAPS capsule TAKE 1 CAPSULE BY MOUTH EVERY DAY   No current facility-administered medications on file prior to visit.    Review of Systems  Constitutional: Negative for activity change, appetite change, chills, diaphoresis, fatigue and fever.  HENT: Negative for congestion and hearing loss.   Eyes: Negative for visual disturbance.  Respiratory: Negative for apnea, cough, chest tightness, shortness of breath and wheezing.   Cardiovascular: Negative for chest pain, palpitations and leg swelling.  Gastrointestinal: Negative for abdominal pain,  anal bleeding, blood in stool, constipation, diarrhea, nausea and vomiting.  Endocrine: Negative for cold intolerance.  Genitourinary: Negative for difficulty urinating, dysuria, frequency and hematuria.  Musculoskeletal: Negative for arthralgias, back pain and neck pain.  Skin: Negative for rash.  Allergic/Immunologic: Negative for environmental allergies.  Neurological: Negative for dizziness, weakness, light-headedness, numbness and headaches.  Hematological: Negative for adenopathy.  Psychiatric/Behavioral: Negative for behavioral problems, dysphoric mood and sleep disturbance. The patient is not nervous/anxious.    Per HPI unless specifically indicated above      Objective:    BP 117/88   Pulse 77   Temp (!) 97.3 F (36.3 C) (Temporal)   Resp 16   Ht 5'  11" (1.803 m)   Wt 179 lb (81.2 kg)   SpO2 99%   BMI 24.97 kg/m   Wt Readings from Last 3 Encounters:  09/04/20 179 lb (81.2 kg)  07/09/20 184 lb (83.5 kg)  07/04/20 178 lb (80.7 kg)    Physical Exam Vitals and nursing note reviewed.  Constitutional:      General: He is not in acute distress.    Appearance: He is well-developed. He is not diaphoretic.     Comments: Well-appearing, comfortable, cooperative  HENT:     Head: Normocephalic and atraumatic.  Eyes:     General:        Right eye: No discharge.        Left eye: No discharge.     Conjunctiva/sclera: Conjunctivae normal.     Pupils: Pupils are equal, round, and reactive to light.  Neck:     Thyroid: No thyromegaly.     Vascular: No carotid bruit.  Cardiovascular:     Rate and Rhythm: Normal rate and regular rhythm.     Heart sounds: Normal heart sounds. No murmur heard.   Pulmonary:     Effort: Pulmonary effort is normal. No respiratory distress.     Breath sounds: Normal breath sounds. No wheezing or rales.  Abdominal:     General: Bowel sounds are normal. There is no distension.     Palpations: Abdomen is soft. There is no mass.     Tenderness:  There is no abdominal tenderness.  Musculoskeletal:        General: No tenderness. Normal range of motion.     Cervical back: Normal range of motion and neck supple.     Right lower leg: No edema.     Left lower leg: No edema.     Comments: Upper / Lower Extremities: - Normal muscle tone, strength bilateral upper extremities 5/5, lower extremities 5/5  Lymphadenopathy:     Cervical: No cervical adenopathy.  Skin:    General: Skin is warm and dry.     Findings: No erythema or rash.  Neurological:     Mental Status: He is alert and oriented to person, place, and time.     Comments: Distal sensation intact to light touch all extremities  Psychiatric:        Behavior: Behavior normal.     Comments: Well groomed, good eye contact, normal speech and thoughts        Results for orders placed or performed in visit on 08/29/20  [facility] - Hepatitis C antibody, Hep C Screen w/ reflex RNA quant  Result Value Ref Range   Hepatitis C Ab NON-REACTIVE NON-REACTI   SIGNAL TO CUT-OFF 0.02 <1.00  [facility] - T4, free  Result Value Ref Range   Free T4 1.0 0.8 - 1.8 ng/dL  [facility] - TSH  Result Value Ref Range   TSH 0.36 (L) 0.40 - 4.50 mIU/L  [facility] - Lipid panel physical  Result Value Ref Range   Cholesterol 160 <200 mg/dL   HDL 61 > OR = 40 mg/dL   Triglycerides 65 <150 mg/dL   LDL Cholesterol (Calc) 85 mg/dL (calc)   Total CHOL/HDL Ratio 2.6 <5.0 (calc)   Non-HDL Cholesterol (Calc) 99 <130 mg/dL (calc)  [facility] - CMET w/ GFR CMP Complete Metabolic Panel physical  Result Value Ref Range   Glucose, Bld 85 65 - 99 mg/dL   BUN 17 7 - 25 mg/dL   Creat 1.33 (H) 0.70 - 1.18 mg/dL   GFR, Est  Non African American 53 (L) > OR = 60 mL/min/1.42m2   GFR, Est African American 61 > OR = 60 mL/min/1.22m2   BUN/Creatinine Ratio 13 6 - 22 (calc)   Sodium 143 135 - 146 mmol/L   Potassium 3.9 3.5 - 5.3 mmol/L   Chloride 106 98 - 110 mmol/L   CO2 28 20 - 32 mmol/L   Calcium 9.3 8.6 - 10.3 mg/dL   Total Protein 6.4  6.1 - 8.1 g/dL   Albumin 4.0 3.6 - 5.1 g/dL   Globulin 2.4 1.9 - 3.7 g/dL (calc)   AG Ratio 1.7 1.0 - 2.5 (calc)   Total Bilirubin 1.1 0.2 - 1.2 mg/dL   Alkaline phosphatase (APISO) 77 35 - 144 U/L   AST 19 10 - 35 U/L   ALT 10 9 - 46 U/L  [facility] - CBC with Differential/Platelet physical  Result Value Ref Range   WBC 4.7 3.8 - 10.8 Thousand/uL   RBC 4.98 4.20 - 5.80 Million/uL   Hemoglobin 15.3 13.2 - 17.1 g/dL   HCT 45.7 38 - 50 %   MCV 91.8 80.0 - 100.0 fL   MCH 30.7 27.0 - 33.0 pg   MCHC 33.5 32.0 - 36.0 g/dL   RDW 14.3 11.0 - 15.0 %   Platelets 135 (L) 140 - 400 Thousand/uL   MPV 10.6 7.5 - 12.5 fL   Neutro Abs 2,463 1,500 - 7,800 cells/uL   Lymphs Abs 1,636 850 - 3,900 cells/uL   Absolute Monocytes 456 200 - 950 cells/uL   Eosinophils Absolute 108 15 - 500 cells/uL   Basophils Absolute 38 0 - 200 cells/uL   Neutrophils Relative % 52.4 %   Total Lymphocyte 34.8 %   Monocytes Relative 9.7 %   Eosinophils Relative 2.3 %   Basophils Relative 0.8 %  [facility] - A1c LAB Hemoglobin A1C physical  Result Value Ref Range   Hgb A1c MFr Bld 5.3 <5.7 % of total Hgb   Mean Plasma Glucose 105 (calc)   eAG (mmol/L) 5.8 (calc)      Assessment & Plan:   Problem List Items Addressed This Visit    Paroxysmal atrial fibrillation (HCC)    Stable, controlled Paroxysmal Atrial Fibrillation, >25 yr Followed by Cardiology Dr Rockey Situ Now off ASA back on Eliquis - he will discuss with Cardiology next visit determine if he should continue      Hyperlipidemia    Controlled cholesterol on statin lifestyle Last lipid panel 08/2020 The 10-year ASCVD risk score Mikey Bussing DC Jr., et al., 2013) is: 15.9%  Plan: 1. Continue current meds - Rosuvastatin 20mg  2. Encourage improved lifestyle - low carb/cholesterol, reduce portion size, continue improving regular exercise      Chronic kidney disease (CKD), stage III (moderate)    Stable to improved, GFR < 60 Known HTN On low dose ACEi      Central  hypothyroidism    Clinically stable Remains off levothyroxine Can re-check TSH Free T4 yearly      Benign localized hyperplasia of prostate with urinary obstruction    BPH controlled on alpha blocker Followed by [facility]      Benign hypertension with CKD (chronic kidney disease) stage III ([place])    Well-controlled HTN Creatinine lab stable 1.3 - Home BP readings stable  Complication CKD-II, PAFib    Plan:  1. Continue current BP regimen - Lisinopril 2.5mg  for renal protection 2. Encourage improved lifestyle - low sodium diet, regular exercise 3. Continue monitor BP outside office, bring  readings to next visit, if persistently >140/90 or new symptoms notify office sooner       Other Visit Diagnoses    Annual physical exam    -  Primary      Updated Health Maintenance information - Return for flu shot later in season - send [place] covid vaccine updates - next colonoscopy 12/2020 - will refer when ready he will consider options on location for GI Reviewed recent lab results with patient Encouraged improvement to lifestyle with diet and exercise - Goal of weight loss   No orders of the defined types were placed in this encounter.     Follow up plan: Return in about 1 year (around 09/04/2021) for Annual Physical.  Future labs 1 year, no PSA. Can add Thyroid panel.  [name], [facility] 09/04/2020, 9:12 AM

## 2020-09-17 ENCOUNTER — Other Ambulatory Visit: Payer: Self-pay

## 2020-09-17 ENCOUNTER — Ambulatory Visit (INDEPENDENT_AMBULATORY_CARE_PROVIDER_SITE_OTHER): Payer: Medicare Other

## 2020-09-17 DIAGNOSIS — Z23 Encounter for immunization: Secondary | ICD-10-CM

## 2020-10-16 ENCOUNTER — Other Ambulatory Visit: Payer: Self-pay | Admitting: Cardiovascular Disease

## 2020-10-16 DIAGNOSIS — E785 Hyperlipidemia, unspecified: Secondary | ICD-10-CM

## 2020-11-05 ENCOUNTER — Ambulatory Visit (INDEPENDENT_AMBULATORY_CARE_PROVIDER_SITE_OTHER): Payer: Medicare Other

## 2020-11-05 VITALS — Ht 71.0 in | Wt 176.0 lb

## 2020-11-05 DIAGNOSIS — Z0000 Encounter for general adult medical examination without abnormal findings: Secondary | ICD-10-CM

## 2020-11-05 NOTE — Progress Notes (Signed)
I connected with [name] today by telephone and verified that I am speaking with the correct person using two identifiers. Location patient: home Location provider: work Persons participating in the virtual visit: Domingo Mend LPN.   I discussed the limitations, risks, security and privacy concerns of performing an evaluation and management service by telephone and the availability of in person appointments. I also discussed with the patient that there may be a patient responsible charge related to this service. The patient expressed understanding and verbally consented to this telephonic visit.    Interactive audio and video telecommunications were attempted between this provider and patient, however failed, due to patient having technical difficulties OR patient did not have access to video capability.  We continued and completed visit with audio only.     Vital signs may be patient reported or missing.  Subjective:   Andrew Benson is a 74 y.o. male who presents for Medicare Annual/Subsequent preventive examination.  Review of Systems     Cardiac Risk Factors include: advanced age (>4men, >44 women);dyslipidemia;hypertension;male gender;sedentary lifestyle     Objective:    Today's Vitals   11/05/20 0956  Weight: 176 lb (79.8 kg)  Height: 5\' 11"  (1.803 m)   Body mass index is 24.55 kg/m.  Advanced Directives 11/05/2020 02/14/2019 02/01/2018 12/29/2017 11/07/2015  Does Patient Have a Medical Advance Directive? Yes Yes Yes Yes Yes  Type of Paramedic of [place];Living will Living will;Healthcare Power of [place];Living will Living will;Healthcare Power of [place];Living will  Copy of [place] in Chart? No - copy requested No - copy requested No - copy requested No - copy requested No - copy requested    Current Medications (verified) Outpatient  Encounter Medications as of 11/05/2020  Medication Sig  . apixaban (ELIQUIS) 5 MG TABS tablet Take 1 tablet (5 mg total) by mouth 2 (two) times daily.  . clobetasol ointment (TEMOVATE) 0.05 % APPLY TO AFFECTED AREA EVERY DAY AS NEEDED  . lisinopril (ZESTRIL) 2.5 MG tablet TAKE 1 TABLET BY MOUTH EVERY DAY  . Multiple Vitamin (MULTIVITAMIN) capsule Take 1 capsule by mouth daily.  . rosuvastatin (CRESTOR) 20 MG tablet TAKE 1 TABLET BY MOUTH EVERY DAY  . sildenafil (REVATIO) 20 MG tablet 3-5 po tablets daily as needed  . tamsulosin (FLOMAX) 0.4 MG CAPS capsule TAKE 1 CAPSULE BY MOUTH EVERY DAY   No facility-administered encounter medications on file as of 11/05/2020.    Allergies (verified) Penicillins   History: Past Medical History:  Diagnosis Date  . Asthma   . Essential hypertension   . Hyperlipidemia   . PAF (paroxysmal atrial fibrillation) (HCC)    a. CHA2DS2VASc = 2;  b. takes amio/eliquis/metoprolol prn for PAF.   Past Surgical History:  Procedure Laterality Date  . COLONOSCOPY WITH PROPOFOL N/A 12/29/2017   Procedure: COLONOSCOPY WITH PROPOFOL;  Surgeon: Manya Silvas, MD;  Location[facility] ENDOSCOPY;  Service: Endoscopy;  Laterality: N/A;  . FINGER SURGERY     left index finger  . HERNIA REPAIR    . INGUINAL HERNIA REPAIR    . KNEE SURGERY     left    Family History  Problem Relation Age of Onset  . Hyperlipidemia Mother   . Hypertension Mother   . Heart disease Mother   . Heart disease Sister        valve repair   . Diabetes Brother   . Hypertension Brother  Social History   Socioeconomic History  . Marital status: Married    Spouse name: Not on file  . Number of children: Not on file  . Years of education: Not on file  . Highest education level: Associate degree: academic program  Occupational History  . Not on file  Tobacco Use  . Smoking status: Former Smoker    Packs/day: 1.00    Years: 20.00    Pack years: 20.00    Types: Cigarettes    Quit  date: 02/01/1993    Years since quitting: 27.7  . Smokeless tobacco: Never Used  Vaping Use  . Vaping Use: Never used  Substance and Sexual Activity  . Alcohol use: Yes    Alcohol/week: 0.0 standard drinks    Comment: social drinker.  . Drug use: No  . Sexual activity: Not on file  Other Topics Concern  . Not on file  Social History Narrative   Participates in church activities, and food bank 3 days a week   Gym 2 times weekly- treadmill    Social Determinants of Health   Financial Resource Strain: Low Risk   . Difficulty of Paying Living Expenses: Not hard at all  Food Insecurity: No Food Insecurity  . Worried About Charity fundraiser in the Last Year: Never true  . Ran Out of Food in the Last Year: Never true  Transportation Needs: No Transportation Needs  . Lack of Transportation (Medical): No  . Lack of Transportation (Non-Medical): No  Physical Activity: Inactive  . Days of Exercise per Week: 0 days  . Minutes of Exercise per Session: 0 min  Stress: No Stress Concern Present  . Feeling of Stress : Not at all  Social Connections:   . Frequency of Communication with Friends and Family: Not on file  . Frequency of Social Gatherings with Friends and Family: Not on file  . Attends Religious Services: Not on file  . Active Member of Clubs or Organizations: Not on file  . Attends Archivist Meetings: Not on file  . Marital Status: Not on file    Tobacco Counseling Counseling given: Not Answered   Clinical Intake:  Pre-visit preparation completed: Yes  Pain : No/denies pain     Nutritional Status: BMI of 19-24  Normal Nutritional Risks: None Diabetes: No  How often do you need to have someone help you when you read instructions, pamphlets, or other written materials from your doctor or pharmacy?: 1 - Never What is the last grade level you completed in school?: some college  Diabetic?no  Interpreter Needed?: No  Information entered by :: NAllen  LPN   Activities of Daily Living In your present state of health, do you have any difficulty performing the following activities: 11/05/2020 09/04/2020  Hearing? N N  Vision? N N  Difficulty concentrating or making decisions? N N  Walking or climbing stairs? N N  Dressing or bathing? N N  Doing errands, shopping? N N  Preparing Food and eating ? N -  Using the Toilet? N -  In the past six months, have you accidently leaked urine? N -  Do you have problems with loss of bowel control? N -  Managing your Medications? N -  Managing your Finances? N -  Housekeeping or managing your Housekeeping? N -  Some recent data might be hidden    Patient Care Team: Olin Hauser, DO as PCP - General (Family Medicine) Minna Merritts, MD as Consulting Physician (Cardiology)  Indicate any recent Medical Services you may have received from other than [facility] providers in the past year (date may be approximate).     Assessment:   This is a routine wellness examination for [place].  Hearing/Vision screen  Hearing Screening   125Hz  250Hz  500Hz  1000Hz  2000Hz  3000Hz  4000Hz  6000Hz  8000Hz   Right ear:           Left ear:           Vision Screening Comments: Regular eye exams, Dr. Ellin Mayhew  Dietary issues and exercise activities discussed: Current Exercise Habits: The patient has a physically strenuous job, but has no regular exercise apart from work.  Goals    . DIET - INCREASE WATER INTAKE     Recommend drinking at least 6-8 glasses of water a day     . Patient Stated     11/05/2020, stay healthy      Depression Screen PHQ 2/9 Scores 11/05/2020 09/04/2020 12/26/2019 10/13/2019 06/22/2019 06/21/2019 02/14/2019  PHQ - 2 Score 0 0 0 0 0 0 0    Fall Risk Fall Risk  11/05/2020 09/04/2020 10/13/2019 06/21/2019 02/14/2019  Falls in the past year? 0 0 0 0 0  Comment - - - - -  Number falls in past yr: - 0 - - -  Comment - - - - -  Injury with Fall? - 0 - - -  Risk for fall due to : Medication  side effect - - - -  Follow up Falls evaluation completed;Education provided;Falls prevention discussed Falls evaluation completed Falls evaluation completed Falls evaluation completed Falls evaluation completed    Any stairs in or around the home? Yes  If so, are there any without handrails? No  Home free of loose throw rugs in walkways, pet beds, electrical cords, etc? Yes  Adequate lighting in your home to reduce risk of falls? Yes   ASSISTIVE DEVICES UTILIZED TO PREVENT FALLS:  Life alert? No  Use of a cane, walker or w/c? No  Grab bars in the bathroom? Yes  Shower chair or bench in shower? Yes  Elevated toilet seat or a handicapped toilet? Yes   TIMED UP AND GO:  Was the test performed? No .   Cognitive Function:     6CIT Screen 11/05/2020 02/14/2019  What Year? 0 points 0 points  What month? 0 points 0 points  What time? 0 points 0 points  Count back from 20 0 points 0 points  Months in reverse 0 points 0 points  Repeat phrase 0 points 0 points  Total Score 0 0    Immunizations Immunization History  Administered Date(s) Administered  . Fluad Quad(high Dose 65+) 10/04/2019  . Influenza,inj,Quad PF,6+ Mos 10/13/2016, 10/04/2017, 09/20/2018, 09/17/2020  . Influenza-Unspecified 08/28/2013  . Moderna SARS-COVID-2 Vaccination 02/02/2020, 03/01/2020  . Pneumococcal Conjugate-13 11/13/2014  . Pneumococcal Polysaccharide-23 10/16/2013  . Tdap 12/22/2007  . Zoster 07/21/2014    TDAP status: Due, Education has been provided regarding the importance of this vaccine. Advised may receive this vaccine at local pharmacy or Health Dept. Aware to provide a copy of the vaccination record if obtained from local pharmacy or Health Dept. Verbalized acceptance and understanding. Flu Vaccine status: Up to date Pneumococcal vaccine status: Up to date Covid-19 vaccine status: Completed vaccines  Qualifies for Shingles Vaccine? Yes   Zostavax completed Yes   Shingrix Completed?: No.     Education has been provided regarding the importance of this vaccine. Patient has been advised to call insurance company to  determine out of pocket expense if they have not yet received this vaccine. Advised may also receive vaccine at local pharmacy or Health Dept. Verbalized acceptance and understanding.  Screening Tests Health Maintenance  Topic Date Due  . TETANUS/TDAP  12/21/2017  . COLONOSCOPY  12/29/2020  . INFLUENZA VACCINE  Completed  . COVID-19 Vaccine  Completed  . Hepatitis C Screening  Completed  . PNA vac Low Risk Adult  Completed    Health Maintenance  Health Maintenance Due  Topic Date Due  . TETANUS/TDAP  12/21/2017    Colorectal cancer screening: Completed 12/29/2017. Repeat every 3 years  Lung Cancer Screening: (Low Dose CT Chest recommended if Age 40-80 years, 30 pack-year currently smoking OR have quit w/in 15years.) does not qualify.   Lung Cancer Screening Referral: no  Additional Screening:  Hepatitis C Screening: does qualify; Completed 08/30/2020  Vision Screening: Recommended annual ophthalmology exams for early detection of glaucoma and other disorders of the eye. Is the patient up to date with their annual eye exam?  Yes  Who is the provider or what is the name of the office in which the patient attends annual eye exams? Dr. Ellin Mayhew If pt is not established with a provider, would they like to be referred to a provider to establish care? No .   Dental Screening: Recommended annual dental exams for proper oral hygiene  Community Resource Referral / Chronic Care Management: CRR required this visit?  No   CCM required this visit?  No      Plan:     I have personally reviewed and noted the following in the patient's chart:   . Medical and social history . Use of alcohol, tobacco or illicit drugs  . Current medications and supplements . Functional ability and status . Nutritional status . Physical activity . Advanced directives . List of  other physicians . Hospitalizations, surgeries, and ER visits in previous 12 months . Vitals . Screenings to include cognitive, depression, and falls . Referrals and appointments  In addition, I have reviewed and discussed with patient certain preventive protocols, quality metrics, and best practice recommendations. A written personalized care plan for preventive services as well as general preventive health recommendations were provided to patient.     Kellie Simmering, LPN   [id number]   Nurse Notes:

## 2020-11-05 NOTE — Patient Instructions (Signed)
Andrew Benson , Thank you for taking time to come for your Medicare Wellness Visit. I appreciate your ongoing commitment to your health goals. Please review the following plan we discussed and let me know if I can assist you in the future.   Screening recommendations/referrals: Colonoscopy: completed 12/29/2017, due 12/29/2020 Recommended yearly ophthalmology/optometry visit for glaucoma screening and checkup Recommended yearly dental visit for hygiene and checkup  Vaccinations: Influenza vaccine: completed 09/17/2020, due 07/21/2021 Pneumococcal vaccine: completed 11/13/2014 Tdap vaccine: due Shingles vaccine: discussed   Covid-19:  03/01/2020, 02/02/2020  Advanced directives: Please bring a copy of your POA (Power of Attorney) and/or Living Will to your next appointment.   Conditions/risks identified: none  Next appointment: Follow up in one year for your annual wellness visit.   Preventive Care 73 Years and Older, Male Preventive care refers to lifestyle choices and visits with your health care provider that can promote health and wellness. What does preventive care include?  A yearly physical exam. This is also called an annual well check.  Dental exams once or twice a year.  Routine eye exams. Ask your health care provider how often you should have your eyes checked.  Personal lifestyle choices, including:  Daily care of your teeth and gums.  Regular physical activity.  Eating a healthy diet.  Avoiding tobacco and drug use.  Limiting alcohol use.  Practicing safe sex.  Taking low doses of aspirin every day.  Taking vitamin and mineral supplements as recommended by your health care provider. What happens during an annual well check? The services and screenings done by your health care provider during your annual well check will depend on your age, overall health, lifestyle risk factors, and family history of disease. Counseling  Your health care provider may ask you  questions about your:  Alcohol use.  Tobacco use.  Drug use.  Emotional well-being.  Home and relationship well-being.  Sexual activity.  Eating habits.  History of falls.  Memory and ability to understand (cognition).  Work and work Statistician. Screening  You may have the following tests or measurements:  Height, weight, and BMI.  Blood pressure.  Lipid and cholesterol levels. These may be checked every 5 years, or more frequently if you are over 5 years old.  Skin check.  Lung cancer screening. You may have this screening every year starting at age 73 if you have a 30-pack-year history of smoking and currently smoke or have quit within the past 15 years.  Fecal occult blood test (FOBT) of the stool. You may have this test every year starting at age 73.  Flexible sigmoidoscopy or colonoscopy. You may have a sigmoidoscopy every 5 years or a colonoscopy every 10 years starting at age 73.  Prostate cancer screening. Recommendations will vary depending on your family history and other risks.  Hepatitis C blood test.  Hepatitis B blood test.  Sexually transmitted disease (STD) testing.  Diabetes screening. This is done by checking your blood sugar (glucose) after you have not eaten for a while (fasting). You may have this done every 1-3 years.  Abdominal aortic aneurysm (AAA) screening. You may need this if you are a current or former smoker.  Osteoporosis. You may be screened starting at age 73 if you are at high risk. Talk with your health care provider about your test results, treatment options, and if necessary, the need for more tests. Vaccines  Your health care provider may recommend certain vaccines, such as:  Influenza vaccine. This is recommended  every year.  Tetanus, diphtheria, and acellular pertussis (Tdap, Td) vaccine. You may need a Td booster every 10 years.  Zoster vaccine. You may need this after age 73.  Pneumococcal 13-valent conjugate  (PCV13) vaccine. One dose is recommended after age 73.  Pneumococcal polysaccharide (PPSV23) vaccine. One dose is recommended after age 70. Talk to your health care provider about which screenings and vaccines you need and how often you need them. This information is not intended to replace advice given to you by your health care provider. Make sure you discuss any questions you have with your health care provider. Document Released: 01/03/2016 Document Revised: 08/26/2016 Document Reviewed: 10/08/2015 Elsevier Interactive Patient Education  2017 [place] Prevention in the Home Falls can cause injuries. They can happen to people of all ages. There are many things you can do to make your home safe and to help prevent falls. What can I do on the outside of my home?  Regularly fix the edges of walkways and driveways and fix any cracks.  Remove anything that might make you trip as you walk through a door, such as a raised step or threshold.  Trim any bushes or trees on the path to your home.  Use bright outdoor lighting.  Clear any walking paths of anything that might make someone trip, such as rocks or tools.  Regularly check to see if handrails are loose or broken. Make sure that both sides of any steps have handrails.  Any raised decks and porches should have guardrails on the edges.  Have any leaves, snow, or ice cleared regularly.  Use sand or salt on walking paths during winter.  Clean up any spills in your garage right away. This includes oil or grease spills. What can I do in the bathroom?  Use night lights.  Install grab bars by the toilet and in the tub and shower. Do not use towel bars as grab bars.  Use non-skid mats or decals in the tub or shower.  If you need to sit down in the shower, use a plastic, non-slip stool.  Keep the floor dry. Clean up any water that spills on the floor as soon as it happens.  Remove soap buildup in the tub or shower  regularly.  Attach bath mats securely with double-sided non-slip rug tape.  Do not have throw rugs and other things on the floor that can make you trip. What can I do in the bedroom?  Use night lights.  Make sure that you have a light by your bed that is easy to reach.  Do not use any sheets or blankets that are too big for your bed. They should not hang down onto the floor.  Have a firm chair that has side arms. You can use this for support while you get dressed.  Do not have throw rugs and other things on the floor that can make you trip. What can I do in the kitchen?  Clean up any spills right away.  Avoid walking on wet floors.  Keep items that you use a lot in easy-to-reach places.  If you need to reach something above you, use a strong step stool that has a grab bar.  Keep electrical cords out of the way.  Do not use floor polish or wax that makes floors slippery. If you must use wax, use non-skid floor wax.  Do not have throw rugs and other things on the floor that can make you trip. What  can I do with my stairs?  Do not leave any items on the stairs.  Make sure that there are handrails on both sides of the stairs and use them. Fix handrails that are broken or loose. Make sure that handrails are as long as the stairways.  Check any carpeting to make sure that it is firmly attached to the stairs. Fix any carpet that is loose or worn.  Avoid having throw rugs at the top or bottom of the stairs. If you do have throw rugs, attach them to the floor with carpet tape.  Make sure that you have a light switch at the top of the stairs and the bottom of the stairs. If you do not have them, ask someone to add them for you. What else can I do to help prevent falls?  Wear shoes that:  Do not have high heels.  Have rubber bottoms.  Are comfortable and fit you well.  Are closed at the toe. Do not wear sandals.  If you use a stepladder:  Make sure that it is fully  opened. Do not climb a closed stepladder.  Make sure that both sides of the stepladder are locked into place.  Ask someone to hold it for you, if possible.  Clearly mark and make sure that you can see:  Any grab bars or handrails.  First and last steps.  Where the edge of each step is.  Use tools that help you move around (mobility aids) if they are needed. These include:  Canes.  Walkers.  Scooters.  Crutches.  Turn on the lights when you go into a dark area. Replace any light bulbs as soon as they burn out.  Set up your furniture so you have a clear path. Avoid moving your furniture around.  If any of your floors are uneven, fix them.  If there are any pets around you, be aware of where they are.  Review your medicines with your doctor. Some medicines can make you feel dizzy. This can increase your chance of falling. Ask your doctor what other things that you can do to help prevent falls. This information is not intended to replace advice given to you by your health care provider. Make sure you discuss any questions you have with your health care provider. Document Released: 10/03/2009 Document Revised: 05/14/2016 Document Reviewed: 01/11/2015 Elsevier Interactive Patient Education  2017 [employer].

## 2020-12-29 ENCOUNTER — Other Ambulatory Visit: Payer: Self-pay | Admitting: Family Medicine

## 2020-12-29 DIAGNOSIS — N401 Enlarged prostate with lower urinary tract symptoms: Secondary | ICD-10-CM

## 2020-12-29 DIAGNOSIS — R339 Retention of urine, unspecified: Secondary | ICD-10-CM

## 2020-12-29 DIAGNOSIS — N138 Other obstructive and reflux uropathy: Secondary | ICD-10-CM

## 2020-12-29 NOTE — Telephone Encounter (Signed)
Requested Prescriptions  Pending Prescriptions Disp Refills  . tamsulosin (FLOMAX) 0.4 MG CAPS capsule [Pharmacy Med Name: TAMSULOSIN HCL 0.4 MG CAPSULE] 90 capsule 1    Sig: TAKE 1 CAPSULE BY [place]     Urology: Alpha-Adrenergic Blocker Passed - 12/29/2020  9:25 AM      Passed - Last BP in normal range    BP Readings from Last 1 Encounters:  09/04/20 117/88         Passed - Valid encounter within last 12 months    Recent Outpatient Visits          3 months ago Annual physical exam   [facility] Olin Hauser, DO   1 year ago Benign hypertension with CKD (chronic kidney disease) stage III [facility])   [facility] Olin Hauser, DO   1 year ago Benign localized hyperplasia of prostate with urinary obstruction   [place], DO   1 year ago Benign hypertension with CKD (chronic kidney disease) stage III [facility])   [place], DO   1 year ago Left lower quadrant abdominal pain   [place], Devonne Doughty, DO      Future Appointments            In 6 months Hollice Espy, MD [place]   In 10 months  [facility]

## 2021-01-07 ENCOUNTER — Other Ambulatory Visit: Payer: Self-pay | Admitting: Cardiovascular Disease

## 2021-01-07 DIAGNOSIS — E785 Hyperlipidemia, unspecified: Secondary | ICD-10-CM

## 2021-01-15 ENCOUNTER — Other Ambulatory Visit: Payer: Self-pay | Admitting: Cardiovascular Disease

## 2021-01-15 NOTE — Telephone Encounter (Signed)
Please review for refill. Thank you! 

## 2021-01-19 NOTE — Progress Notes (Unsigned)
Cardiology Office Note  Date:  01/20/2021   ID:  Andrew Benson, DOB February 17, 1946, MRN [id number]  PCP:  Olin Hauser, DO   Chief Complaint  Patient presents with  . Follow-up    Annual follow up. Medications verbally reviewed with patient.     HPI:  Andrew Benson is a pleasant 75 year old gentleman with remote history of  atrial fibrillation 25 years ago,  remote history of smoking for 20 years,  atrial fib 10/26/13  atrial fibrillation while he was on his tractor in 2015, developed acute onset of palpitations, presented to the emergency room  2019 episode in [place], 20 min who presents for routine followup for atrial fibrillation.   In follow-up reports he feels well  no recent atrial fibrillation episode He did report one episode of tachycardia that felt different from atrial fibrillation lasting 10 seconds No recurrent episodes Reports some noncompliance with  Eliquis dosing  Goes to church, unloads food at the food pantry  Exercising No chest pain or shortness of breath on exertion Weight stable  Having some neck pain shoulder pain Wonders if it is side effect of one of his medications  EKG personally reviewed by myself on todays visit Showing normal sinus rhythm rate 67 bpm  On last office visit in early 2019 had been in [place] visiting family, large coffee  developed profound tachycardia rate up to 200 bpm Lasted approximately 20 minutes before terminating Towards the tail end of the arrhythmia took metoprolol possibly amiodarone  At that time reported having paroxysmal episodes of tachycardia rate up to 130s at times   chads VASC  score of 2, age, blood pressure   on Crestor 20 mg daily  Total chol 130, LDL 73    PMH:   has a past medical history of Asthma, Essential hypertension, Hyperlipidemia, and PAF (paroxysmal atrial fibrillation) ([place]).  PSH:    Past Surgical History:  Procedure Laterality Date  . COLONOSCOPY WITH PROPOFOL N/A 12/29/2017    Procedure: COLONOSCOPY WITH PROPOFOL;  Surgeon: Manya Silvas, MD;  Location: [facility] ENDOSCOPY;  Service: Endoscopy;  Laterality: N/A;  . FINGER SURGERY     left index finger  . HERNIA REPAIR    . INGUINAL HERNIA REPAIR    . KNEE SURGERY     left     Current Outpatient Medications  Medication Sig Dispense Refill  . ELIQUIS 5 MG TABS tablet TAKE 1 TABLET BY MOUTH TWICE A DAY 60 tablet 11  . lisinopril (ZESTRIL) 2.5 MG tablet TAKE 1 TABLET BY MOUTH EVERY DAY 90 tablet 3  . Misc Natural Products (PROSTATE HEALTH PO) Take by mouth.    . Multiple Vitamin (MULTIVITAMIN) capsule Take 1 capsule by mouth daily.    . rosuvastatin (CRESTOR) 20 MG tablet TAKE 1 TABLET BY MOUTH EVERY DAY 90 tablet 3  . sildenafil (REVATIO) 20 MG tablet 3-5 po tablets daily as needed 90 tablet 3  . tamsulosin (FLOMAX) 0.4 MG CAPS capsule TAKE 1 CAPSULE BY MOUTH EVERY DAY 90 capsule 1   No current facility-administered medications for this visit.    Allergies:   Penicillins   Social History:  The patient  reports that he quit smoking about 27 years ago. His smoking use included cigarettes. He has a 20.00 pack-year smoking history. He has never used smokeless tobacco. He reports current alcohol use. He reports that he does not use drugs.   Family History:   family history includes Diabetes in his brother; Heart disease in his mother  and sister; Hyperlipidemia in his mother; Hypertension in his brother and mother.    Review of Systems: Review of Systems  Constitutional: Negative.   Respiratory: Negative.   Cardiovascular: Positive for palpitations.  Gastrointestinal: Negative.   Musculoskeletal: Negative.   Neurological: Negative.   Psychiatric/Behavioral: Negative.   All other systems reviewed and are negative.   PHYSICAL EXAM: VS:  BP 108/76 (BP Location: Left Arm, Patient Position: Sitting, Cuff Size: Normal)   Pulse 67   Ht 5\' 11"  (1.803 m)   Wt 185 lb (83.9 kg)   SpO2 98%   BMI 25.80 kg/m  ,  BMI Body mass index is 25.8 kg/m. Constitutional:  oriented to person, place, and time. No distress.  HENT:  Head: Grossly normal Eyes:  no discharge. No scleral icterus.  Neck: No JVD, no carotid bruits  Cardiovascular: Regular rate and rhythm, no murmurs appreciated Pulmonary/Chest: Clear to auscultation bilaterally, no wheezes or rails Abdominal: Soft.  no distension.  no tenderness.  Musculoskeletal: Normal range of motion Neurological:  normal muscle tone. Coordination normal. No atrophy Skin: Skin warm and dry Psychiatric: normal affect, pleasant  Recent Labs: 08/30/2020: ALT 10; BUN 17; Creat 1.33; Hemoglobin 15.3; Platelets 135; Potassium 3.9; Sodium 143; TSH 0.36    Lipid Panel Lab Results  Component Value Date   CHOL 160 08/30/2020   HDL 61 08/30/2020   LDLCALC 85 08/30/2020   TRIG 65 08/30/2020      Wt Readings from Last 3 Encounters:  01/20/21 185 lb (83.9 kg)  11/05/20 176 lb (79.8 kg)  09/04/20 179 lb (81.2 kg)      ASSESSMENT AND PLAN:  Paroxysmal atrial fibrillation ([place]) - Plan: EKG 12-Lead CHADS VASC of 2,  Eliquis 5 mg twice daily Maintaining normal sinus rhythm Rare episode of tachycardia  Mixed hyperlipidemia Tolerating Crestor Cholesterol at goal  Essential hypertension -  BP stable  CKD (chronic kidney disease), stage II Stable Monitored by primary care  Other chest pain musculoskeletal pain   Total encounter time more than 25 minutes  Greater than 50% was spent in counseling and coordination of care with the patient    No orders of the defined types were placed in this encounter.    Signed, Esmond Plants, M.D., Ph.D. 01/20/2021  [place], [place]

## 2021-01-20 ENCOUNTER — Encounter: Payer: Self-pay | Admitting: Cardiovascular Disease

## 2021-01-20 ENCOUNTER — Ambulatory Visit: Payer: Medicare Other | Admitting: Cardiovascular Disease

## 2021-01-20 ENCOUNTER — Other Ambulatory Visit: Payer: Self-pay

## 2021-01-20 VITALS — BP 108/76 | HR 67 | Ht 71.0 in | Wt 185.0 lb

## 2021-01-20 DIAGNOSIS — I480 Paroxysmal atrial fibrillation: Secondary | ICD-10-CM

## 2021-01-20 DIAGNOSIS — Z87891 Personal history of nicotine dependence: Secondary | ICD-10-CM

## 2021-01-20 DIAGNOSIS — I10 Essential (primary) hypertension: Secondary | ICD-10-CM | POA: Diagnosis not present

## 2021-01-20 DIAGNOSIS — N182 Chronic kidney disease, stage 2 (mild): Secondary | ICD-10-CM | POA: Diagnosis not present

## 2021-01-20 DIAGNOSIS — E782 Mixed hyperlipidemia: Secondary | ICD-10-CM | POA: Diagnosis not present

## 2021-01-20 NOTE — Patient Instructions (Signed)
Medication Instructions:  No changes  If you need a refill on your cardiac medications before your next appointment, please call your pharmacy.    Lab work: No new labs needed   If you have labs (blood work) drawn today and your tests are completely normal, you will receive your results only by: . MyChart Message (if you have MyChart) OR . A paper copy in the mail If you have any lab test that is abnormal or we need to change your treatment, we will call you to review the results.   Testing/Procedures: No new testing needed   Follow-Up: At [facility], you and your health needs are our priority.  As part of our continuing mission to provide you with exceptional heart care, we have created designated Provider Care Teams.  These Care Teams include your primary Cardiologist (physician) and Advanced Practice Providers (APPs -  Physician Assistants and Nurse Practitioners) who all work together to provide you with the care you need, when you need it.  . You will need a follow up appointment in 12 months  . Providers on your designated Care Team:   . Christopher Berge, NP . Ryan Dunn, PA-C . Jacquelyn Visser, PA-C  Any Other Special Instructions Will Be Listed Below (If Applicable).  COVID-19 Vaccine Information can be found at: [email or web address] For questions related to vaccine distribution or appointments, please email [email or web address] or call [phone].     

## 2021-02-10 ENCOUNTER — Encounter: Payer: Self-pay | Admitting: Family Medicine

## 2021-02-10 ENCOUNTER — Other Ambulatory Visit: Payer: Self-pay

## 2021-02-10 ENCOUNTER — Ambulatory Visit (INDEPENDENT_AMBULATORY_CARE_PROVIDER_SITE_OTHER): Payer: Medicare Other | Admitting: Family Medicine

## 2021-02-10 VITALS — BP 142/78 | HR 73 | Ht 71.0 in | Wt 184.4 lb

## 2021-02-10 DIAGNOSIS — M25512 Pain in left shoulder: Secondary | ICD-10-CM | POA: Diagnosis not present

## 2021-02-10 DIAGNOSIS — G8929 Other chronic pain: Secondary | ICD-10-CM | POA: Diagnosis not present

## 2021-02-10 DIAGNOSIS — M7542 Impingement syndrome of left shoulder: Secondary | ICD-10-CM

## 2021-02-10 MED ORDER — BACLOFEN 10 MG PO TABS
5.0000 mg | ORAL_TABLET | Freq: Three times a day (TID) | ORAL | 2 refills | Status: DC | PRN
Start: 2021-02-10 — End: 2021-03-13

## 2021-02-10 NOTE — Progress Notes (Signed)
Subjective:    Patient ID: Andrew Benson, male    DOB: [birth year], 75 y.o.   MRN: [id number]  Andrew Benson is a 75 y.o. male presenting on 02/10/2021 for Shoulder Pain (Left)   HPI   Left Shoulder Pain  - Original injury >2 years ago, slipped and fell on L Shoulder, ultimately it had improved.  - Question about statin, he said may have provoked some musculoskeletal pain but we are currently unsure, remains off medication now.  - Now symptoms started to flare up again within past 1 month, without obvious injury or provoking scenario. He did start project on a deck but not doing repetitive activity with Left arm.  He is right hand dominant  - He describes symptoms include a variety of symptoms including at rest can have some throbbing pain from neck into shoulder. He had reduced range of motion forward and abduction to side, denies any numbness and tingling into hand and fingers. - Not tried any new OTC medications for pain. Not taking Tylenol or NSAID. Not using heat or ice.   Depression screen [facility] 2/9 11/05/2020 09/04/2020 12/26/2019  Decreased Interest 0 0 0  Down, Depressed, Hopeless 0 0 0  PHQ - 2 Score 0 0 0    Social History   Tobacco Use  . Smoking status: Former Smoker    Packs/day: 1.00    Years: 20.00    Pack years: 20.00    Types: Cigarettes    Quit date: 02/01/1993    Years since quitting: 28.0  . Smokeless tobacco: Never Used  Vaping Use  . Vaping Use: Never used  Substance Use Topics  . Alcohol use: Yes    Alcohol/week: 0.0 standard drinks    Comment: social drinker.  . Drug use: No    Review of Systems Per HPI unless specifically indicated above     Objective:    BP (!) 142/78   Pulse 73   Ht 5\' 11"  (1.803 m)   Wt 184 lb 6.4 oz (83.6 kg)   SpO2 96%   BMI 25.72 kg/m   Wt Readings from Last 3 Encounters:  02/10/21 184 lb 6.4 oz (83.6 kg)  01/20/21 185 lb (83.9 kg)  11/05/20 176 lb (79.8 kg)    Physical Exam Vitals and nursing note  reviewed.  Constitutional:      General: He is not in acute distress.    Appearance: He is well-developed and well-nourished. He is not diaphoretic.     Comments: Well-appearing, comfortable, cooperative  HENT:     Head: Normocephalic and atraumatic.     Mouth/Throat:     Mouth: Oropharynx is clear and moist.  Eyes:     General:        Right eye: No discharge.        Left eye: No discharge.     Conjunctiva/sclera: Conjunctivae normal.  Cardiovascular:     Rate and Rhythm: Normal rate.  Pulmonary:     Effort: Pulmonary effort is normal.  Musculoskeletal:        General: No edema.     Comments: Left Shoulder Inspection: Normal appearance bilateral symmetrical Palpation: Non-tender to palpation over anterior, lateral, or posterior shoulder  ROM: Reduced range of motion forward flexion above shoulder range but difficult, and significantly reduced abduction at side < 90*, intact internal rotation Special Testing: Rotator cuff testing positive for pain and weakness supraspinatus muscle with empty can test positive. Impingement test was negative Strength: Normal strength 5/5 flex/ext, ext rot /  int rot, grip, rotator cuff str testing. Neurovascular: Distally intact pulses, sensation to light touch   Skin:    General: Skin is warm and dry.     Findings: No erythema or rash.  Neurological:     Mental Status: He is alert and oriented to person, place, and time.  Psychiatric:        Mood and Affect: Mood and affect normal.        Behavior: Behavior normal.     Comments: Well groomed, good eye contact, normal speech and thoughts       Results for orders placed or performed in visit on 08/29/20  [facility] - Hepatitis C antibody, Hep C Screen w/ reflex RNA quant  Result Value Ref Range   Hepatitis C Ab NON-REACTIVE NON-REACTI   SIGNAL TO CUT-OFF 0.02 <1.00  [facility] - T4, free  Result Value Ref Range   Free T4 1.0 0.8 - 1.8 ng/dL  [facility] - TSH  Result Value Ref Range   TSH 0.36 (L) 0.40 -  4.50 mIU/L  [facility] - Lipid panel physical  Result Value Ref Range   Cholesterol 160 <200 mg/dL   HDL 61 > OR = 40 mg/dL   Triglycerides 65 <150 mg/dL   LDL Cholesterol (Calc) 85 mg/dL (calc)   Total CHOL/HDL Ratio 2.6 <5.0 (calc)   Non-HDL Cholesterol (Calc) 99 <130 mg/dL (calc)  [facility] - CMET w/ GFR CMP Complete Metabolic Panel physical  Result Value Ref Range   Glucose, Bld 85 65 - 99 mg/dL   BUN 17 7 - 25 mg/dL   Creat 1.33 (H) 0.70 - 1.18 mg/dL   GFR, Est Non African American 53 (L) > OR = 60 mL/min/1.55m2   GFR, Est African American 61 > OR = 60 mL/min/1.20m2   BUN/Creatinine Ratio 13 6 - 22 (calc)   Sodium 143 135 - 146 mmol/L   Potassium 3.9 3.5 - 5.3 mmol/L   Chloride 106 98 - 110 mmol/L   CO2 28 20 - 32 mmol/L   Calcium 9.3 8.6 - 10.3 mg/dL   Total Protein 6.4 6.1 - 8.1 g/dL   Albumin 4.0 3.6 - 5.1 g/dL   Globulin 2.4 1.9 - 3.7 g/dL (calc)   AG Ratio 1.7 1.0 - 2.5 (calc)   Total Bilirubin 1.1 0.2 - 1.2 mg/dL   Alkaline phosphatase (APISO) 77 35 - 144 U/L   AST 19 10 - 35 U/L   ALT 10 9 - 46 U/L  [facility] - CBC with Differential/Platelet physical  Result Value Ref Range   WBC 4.7 3.8 - 10.8 Thousand/uL   RBC 4.98 4.20 - 5.80 Million/uL   Hemoglobin 15.3 13.2 - 17.1 g/dL   HCT 45.7 38.5 - 50.0 %   MCV 91.8 80.0 - 100.0 fL   MCH 30.7 27.0 - 33.0 pg   MCHC 33.5 32.0 - 36.0 g/dL   RDW 14.3 11.0 - 15.0 %   Platelets 135 (L) 140 - 400 Thousand/uL   MPV 10.6 7.5 - 12.5 fL   Neutro Abs 2,463 1,500 - 7,800 cells/uL   Lymphs Abs 1,636 850 - 3,900 cells/uL   Absolute Monocytes 456 200 - 950 cells/uL   Eosinophils Absolute 108 15 - 500 cells/uL   Basophils Absolute 38 0 - 200 cells/uL   Neutrophils Relative % 52.4 %   Total Lymphocyte 34.8 %   Monocytes Relative 9.7 %   Eosinophils Relative 2.3 %   Basophils Relative 0.8 %  [facility] - A1c LAB Hemoglobin A1C  physical  Result Value Ref Range   Hgb A1c MFr Bld 5.3 <5.7 % of total Hgb   Mean Plasma Glucose 105 (calc)   eAG  (mmol/L) 5.8 (calc)      Assessment & Plan:   Problem List Items Addressed This Visit   None   Visit Diagnoses    Chronic left shoulder pain    -  Primary   Relevant Medications   baclofen (LIORESAL) 10 MG tablet   Other Relevant Orders   DG Shoulder Left   Shoulder impingement syndrome, left       Relevant Medications   baclofen (LIORESAL) 10 MG tablet   Other Relevant Orders   DG Shoulder Left      Consistent with subacute Left-shoulder bursitis vs rotator cuff tendinopathy with some reduced active ROM but with focal supraspinatus weakness. No clear etiology of injury. Now but has known old injury 2 year ago fall on shoulder Likely underlying arthritis component No imaging on chart  Plan: 1. On Eliquis, avoid oral NSAID - start topical OTC Diclofenac 1-4 times a day PRN 2. May take Tylenol Ex Str 1-2 q 6 hr PRN 3. Start rx baclofen 5-10mg  TID PRN caution sedation 4. Relative rest but keep shoulder mobile, demonstrated ROM exercises, avoid heavy lifting 5. May try heating pad PRN 6. Follow-up 4-6 weeks if not improved for re-evaluation, consider referral to Physical Therapy, X-rays, and or subacromial steroid injection or ortho refer   Meds ordered this encounter  Medications  . baclofen (LIORESAL) 10 MG tablet    Sig: Take 0.5-1 tablets (5-10 mg total) by mouth 3 (three) times daily as needed for muscle spasms.    Dispense:  30 each    Refill:  2      Follow up plan: Return in about 6 weeks (around 03/24/2021), or if symptoms worsen or fail to improve, for 4-6 week follow-up if not improved, L Shoulder, can do injection or refer to Ortho.   [name], [facility] 02/10/2021, 3:53 PM

## 2021-02-10 NOTE — Patient Instructions (Addendum)
Thank you for coming to the office today.  .Most likely you have bursitis of your shoulder. This is inflammation of the shoulder joint caused most often by arthritis or wear and tear. Often it can flare up to cause bursitis due to repetitive activities or other triggers. It may take time to heal, possibly 2 to 6 weeks, and it is important to avoid over use of shoulder especially above head motions that can re-aggravate the problem.   Try topical diclofenac / voltaren on first half of shoulder 1-4 times a day.  Start taking Baclofen (Lioresal) 10mg  (muscle relaxant) - start with half (cut) to one whole pill at night as needed for next 1-3 nights (may make you drowsy, caution with driving) see how it affects you, then if tolerated increase to one pill 2 to 3 times a day or (every 8 hours as needed)  Recommend to start taking Tylenol Extra Strength 500mg  tabs - take 1 to 2 tabs per dose (max 1000mg ) every 6-8 hours for pain (take regularly, don't skip a dose for next 7 days), max 24 hour daily dose is 6 tablets or 3000mg . In the future you can repeat the same everyday Tylenol course for 1-2 weeks at a time.    X-ray for next week - no x-ray tech this week, come in first come first serve walk in for x-ray Mon-Thurs (not Friday) for X-ray  Also we can refer you to Physical Therapy if just need to improve on range of motion.  Lastly if not improved by about 4-6 weeks - or just need treatment sooner - call and return for a Steroid Injection in shoulder OR Ortho referral.   Please schedule a Follow-up Appointment to: Return in about 6 weeks (around 03/24/2021), or if symptoms worsen or fail to improve, for 4-6 week follow-up if not improved, L Shoulder, can do injection or refer to Ortho.  If you have any other questions or concerns, please feel free to call the office or send a message through [place]. You may also schedule an earlier appointment if necessary.  Additionally, you may be receiving a  survey about your experience at our office within a few days to 1 week by e-mail or mail. We value your feedback.  [name], DO [facility], [facility]  Range of Motion Shoulder Exercises  [place] with your good arm against a counter or table for support [facility] forward with a wide stance (make sure your body is comfortable) - Your painful shoulder should hang down and feel "heavy" - Gently move your painful arm in small circles "clockwise" for several turns - Switch to "counterclockwise" for several turns - Early on keep circles narrow and move slowly - Later in rehab, move in larger circles and faster movement   Wall Crawl - Stand close (about 1-2 ft away) to a wall, facing it directly - Reach out with your arm of painful shoulder and place fingers (not palm) on wall - You should make contact with wall at your waist level - Slowly walk your fingers up the wall. Stay in contact with wall entire time, do not remove fingers - Keep walking fingers up wall until you reach shoulder level - You may feel tightening or mild discomfort, once you reach a height that causes pain or if you are already above your shoulder height then stop. Repeat from starting position. - Early on stand closer to wall, move fingers slowly, and stay at or below  shoulder level - Later in rehab, stand farther away from wall (fingertips), move fingers quicker, go above shoulder level

## 2021-03-07 ENCOUNTER — Encounter: Payer: Self-pay | Admitting: Family Medicine

## 2021-03-07 ENCOUNTER — Ambulatory Visit (INDEPENDENT_AMBULATORY_CARE_PROVIDER_SITE_OTHER): Payer: Medicare Other | Admitting: Family Medicine

## 2021-03-07 ENCOUNTER — Other Ambulatory Visit: Payer: Self-pay

## 2021-03-07 VITALS — BP 131/69 | HR 88 | Ht 71.0 in | Wt 182.6 lb

## 2021-03-07 DIAGNOSIS — R1031 Right lower quadrant pain: Secondary | ICD-10-CM | POA: Diagnosis not present

## 2021-03-07 DIAGNOSIS — Z8719 Personal history of other diseases of the digestive system: Secondary | ICD-10-CM

## 2021-03-07 DIAGNOSIS — Z9889 Other specified postprocedural states: Secondary | ICD-10-CM | POA: Diagnosis not present

## 2021-03-07 DIAGNOSIS — M545 Low back pain, unspecified: Secondary | ICD-10-CM | POA: Diagnosis not present

## 2021-03-07 NOTE — Progress Notes (Signed)
Subjective:    Patient ID: Andrew Benson, male    DOB: Jun 17, 1946, 75 y.o.   MRN: [id number]  Andrew Benson is a 75 y.o. 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 Has not lifted anything since that time. Did not feel an actual bulging but has sensation that something in groin not supposed to be there. - Takes Baclofen PRN for shoulder, only 1-2 times, has rx available. He can try this for back/groin pain Bilateral inguinal repair [place] Later a revision to one inguinal hernia repair early 2000s here at [facility] - uncertain which location - Denies any numbness or tingling into RLE lower extremities   Past Surgical History:  Procedure Laterality Date  . COLONOSCOPY WITH PROPOFOL N/A 12/29/2017   Procedure: COLONOSCOPY WITH PROPOFOL;  Surgeon: Manya Silvas, MD;  Location: [facility] ENDOSCOPY;  Service: Endoscopy;  Laterality: N/A;  . FINGER SURGERY     left index finger  . HERNIA REPAIR    . INGUINAL HERNIA REPAIR    . KNEE SURGERY     left      Depression screen [facility] 2/9 11/05/2020 09/04/2020 12/26/2019  Decreased Interest 0 0 0  Down, Depressed, Hopeless 0 0 0  PHQ - 2 Score 0 0 0    Social History   Tobacco Use  . Smoking status: Former Smoker    Packs/day: 1.00    Years: 20.00    Pack years: 20.00    Types: Cigarettes    Quit date: 02/01/1993    Years since quitting: 28.1  . Smokeless tobacco: Never Used  Vaping Use  . Vaping Use: Never used  Substance Use Topics  . Alcohol use: Yes    Alcohol/week: 0.0 standard drinks    Comment: social drinker.  . Drug use: No    Review of Systems Per HPI  unless specifically indicated above     Objective:    BP 131/69   Pulse 88   Ht 5\' 11"  (1.803 m)   Wt 182 lb 9.6 oz (82.8 kg)   BMI 25.47 kg/m   Wt Readings from Last 3 Encounters:  03/07/21 182 lb 9.6 oz (82.8 kg)  02/10/21 184 lb 6.4 oz (83.6 kg)  01/20/21 185 lb (83.9 kg)    Physical Exam Vitals and nursing note reviewed.  Constitutional:      General: He is not in acute distress.    Appearance: He is well-developed. He is not diaphoretic.     Comments: Well-appearing, comfortable, cooperative  HENT:     Head: Normocephalic and atraumatic.  Eyes:     General:        Right eye: No discharge.        Left eye: No discharge.     Conjunctiva/sclera: Conjunctivae normal.  Cardiovascular:     Rate and Rhythm: Normal rate.  Pulmonary:     Effort: Pulmonary effort is normal.  Abdominal:     General: Bowel sounds are normal.     Palpations: Abdomen is soft.     Hernia: No hernia (No hernia reproduced on laying supine and sitting  up, or standing valsalva) is present.  Musculoskeletal:     Comments: Low Back Inspection: Normal appearance, thin body habitus, no spinal deformity, symmetrical. Palpation: No tenderness over spinous processes. Bilateral lumbar paraspinal muscles non-tender and without hypertonicity/spasm. ROM: Full active ROM forward flex / back extension, rotation L/R without discomfort Special Testing: Seated SLR negative for radicular pain bilaterally SLR does cause R groin pain Strength: Bilateral hip flex/ext 5/5, knee flex/ext 5/5, ankle dorsiflex/plantarflex 5/5 Neurovascular: intact distal sensation to light touch   Skin:    General: Skin is warm and dry.     Findings: No erythema or rash.  Neurological:     Mental Status: He is alert and oriented to person, place, and time.  Psychiatric:        Behavior: Behavior normal.     Comments: Well groomed, good eye contact, normal speech and thoughts    Results for orders placed or performed in visit on  08/29/20  [facility] - Hepatitis C antibody, Hep C Screen w/ reflex RNA quant  Result Value Ref Range   Hepatitis C Ab NON-REACTIVE NON-REACTI   SIGNAL TO CUT-OFF 0.02 <1.00  [facility] - T4, free  Result Value Ref Range   Free T4 1.0 0.8 - 1.8 ng/dL  [facility] - TSH  Result Value Ref Range   TSH 0.36 (L) 0.40 - 4.50 mIU/L  [facility] - Lipid panel physical  Result Value Ref Range   Cholesterol 160 <200 mg/dL   HDL 61 > OR = 40 mg/dL   Triglycerides 65 <150 mg/dL   LDL Cholesterol (Calc) 85 mg/dL (calc)   Total CHOL/HDL Ratio 2.6 <5.0 (calc)   Non-HDL Cholesterol (Calc) 99 <130 mg/dL (calc)  [facility] - CMET w/ GFR CMP Complete Metabolic Panel physical  Result Value Ref Range   Glucose, Bld 85 65 - 99 mg/dL   BUN 17 7 - 25 mg/dL   Creat 1.33 (H) 0.70 - 1.18 mg/dL   GFR, Est Non African American 53 (L) > OR = 60 mL/min/1.94m2   GFR, Est African American 61 > OR = 60 mL/min/1.32m2   BUN/Creatinine Ratio 13 6 - 22 (calc)   Sodium 143 135 - 146 mmol/L   Potassium 3.9 3.5 - 5.3 mmol/L   Chloride 106 98 - 110 mmol/L   CO2 28 20 - 32 mmol/L   Calcium 9.3 8.6 - 10.3 mg/dL   Total Protein 6.4 6.1 - 8.1 g/dL   Albumin 4.0 3.6 - 5.1 g/dL   Globulin 2.4 1.9 - 3.7 g/dL (calc)   AG Ratio 1.7 1.0 - 2.5 (calc)   Total Bilirubin 1.1 0.2 - 1.2 mg/dL   Alkaline phosphatase (APISO) 77 35 - 144 U/L   AST 19 10 - 35 U/L   ALT 10 9 - 46 U/L  [facility] - CBC with Differential/Platelet physical  Result Value Ref Range   WBC 4.7 3.8 - 10.8 Thousand/uL   RBC 4.98 4.20 - 5.80 Million/uL   Hemoglobin 15.3 13.2 - 17.1 g/dL   HCT 45.7 38.5 - 50.0 %   MCV 91.8 80.0 - 100.0 fL   MCH 30.7 27.0 - 33.0 pg   MCHC 33.5 32.0 - 36.0 g/dL   RDW 14.3 11.0 - 15.0 %   Platelets 135 (L) 140 - 400 Thousand/uL   MPV 10.6 7.5 - 12.5 fL   Neutro Abs 2,463 1,500 - 7,800 cells/uL   Lymphs Abs 1,636 850 - 3,900 cells/uL   Absolute Monocytes 456 200 - 950 cells/uL   Eosinophils  Absolute 108 15 - 500 cells/uL   Basophils Absolute 38 0 - 200  cells/uL   Neutrophils Relative % 52.4 %   Total Lymphocyte 34.8 %   Monocytes Relative 9.7 %   Eosinophils Relative 2.3 %   Basophils Relative 0.8 %  [facility] - A1c LAB Hemoglobin A1C physical  Result Value Ref Range   Hgb A1c MFr Bld 5.3 <5.7 % of total Hgb   Mean Plasma Glucose 105 (calc)   eAG (mmol/L) 5.8 (calc)      Assessment & Plan:   Problem List Items Addressed This Visit   None   Visit Diagnoses    Acute right-sided low back pain without sciatica    -  Primary   Relevant Orders   Ambulatory referral to General Surgery   Right groin pain       Relevant Orders   Ambulatory referral to General Surgery   S/P inguinal hernia repair       Relevant Orders   Ambulatory referral to General Surgery      Clinically with R groin inguinal pain localized area to prior hernia repair Has had multiple hernia surgery in past since 1996, bilateral then unilateral repair (uncertain side) Chronic issue with R inguinal region, now acute flare up - question if back pain MSK etiology from heavy lifting radiating into leg and groin, no symptoms of acute sciatica and back is mostly resolved now. - Reassurance given, no obvious herniation at this time on my exam - May use existing Baclofen PRN for back - Limit heavy lifting temporarily as it heals. - Will refer to gen surgery for evaluation and another opinion with his chronic hernia history, may warrant further intervention.  No orders of the defined types were placed in this encounter.  Orders Placed This Encounter  Procedures  . Ambulatory referral to General Surgery    Referral Priority:   Routine    Referral Type:   Surgical    Referral Reason:   Specialty Services Required    Requested Specialty:   General Surgery    Number of Visits Requested:   1      Follow up plan: Return if symptoms worsen or fail to improve.    [name], [facility] 03/07/2021, 8:54 AM

## 2021-03-07 NOTE — Patient Instructions (Addendum)
Thank you for coming to the office today.   [facility] Address: [address], [place], [place] [address] Phone: [phone]  Try the Baclofen as needed for back or groin pain as needed.  Recommend to start taking Tylenol Extra Strength 500mg  tabs - take 1 to 2 tabs per dose (max 1000mg ) every 6-8 hours for pain (take regularly, don't skip a dose for next 7 days), max 24 hour daily dose is 6 tablets or 3000mg . In the future you can repeat the same everyday Tylenol course for 1-2 weeks at a time.   Recommend a "Hernia Truss", try to wear this regularly (do not need to wear to bed if pain is improved), until it feels better and then only with activities - If it is not improving then you may need to wear it every day, especially if you cannot have a surgery to fix the hernia  You can find a Truss OTC at [facility]  Try to find positions that give you most relief, likely laying down with head lower than body will allow the hernia bulging to go back into place and feel better.  Can try topical ice packs or muscle rub if burning nerve sensation.  If significant worsening pain or you get bulging that does NOT go down or go away and CANNOT push back in, or nausea, vomiting, then it is very important to go directly to hospital ED for more immediate evaluation, as this can be a life threatening surgical emergency    Please schedule a Follow-up Appointment to: Return if symptoms worsen or fail to improve.  If you have any other questions or concerns, please feel free to call the office or send a message through [place]. You may also schedule an earlier appointment if necessary.  Additionally, you may be receiving a survey about your experience at our office within a few days to 1 week by e-mail or mail. We value your feedback.  [name], DO [place]

## 2021-03-13 ENCOUNTER — Encounter: Payer: Self-pay | Admitting: General Surgery

## 2021-03-13 ENCOUNTER — Ambulatory Visit: Payer: Medicare Other | Admitting: General Surgery

## 2021-03-13 ENCOUNTER — Other Ambulatory Visit: Payer: Self-pay

## 2021-03-13 VITALS — BP 114/79 | HR 80 | Temp 98.3°F | Ht 71.0 in | Wt 182.0 lb

## 2021-03-13 DIAGNOSIS — R1031 Right lower quadrant pain: Secondary | ICD-10-CM

## 2021-03-13 NOTE — Progress Notes (Signed)
Patient ID: Andrew Benson, male   DOB: 25-Feb-1946, 75 y.o.   MRN: [id number]  Chief Complaint  Patient presents with   New Patient (Initial Visit)    Hernia     HPI Andrew Benson is a 75 y.o. male.   He has been referred by his primary care provider, Andrew Benson, for further evaluation of right groin pain.  Andrew Benson has a history of bilateral inguinal hernia repairs in the past, as well as a redo on one side, but he cannot remember which side was redone.  He reports that prior to the onset of his right groin discomfort, he was doing a lot of heavy lifting for about 4 days in a row, volunteering at a local food bank.  He reports that after he had the onset of groin pain, he also had difficulty lifting his right leg while he was in a sitting position.  He describes this as an odd feeling or that something is there that should not be.  Since he saw Andrew Benson, he reports that the groin discomfort has improved.  No hernia was appreciated by Andrew Benson at their visit, but due to his ongoing discomfort, he was referred here for further evaluation.  He denies any numbness or tingling in the area.  He says that the discomfort from his groin did radiate down his leg, but this has since improved.  He says he took ibuprofen on one occasion, but has not taken it otherwise.  Review of the electronic medical record does reveal that he has bilateral hydroceles, being followed by Andrew Benson in urology.  A pelvic MRI done in 2020 showed the hydroceles without evidence of hernia.   Past Medical History:  Diagnosis Date   Asthma    Essential hypertension    Hyperlipidemia    PAF (paroxysmal atrial fibrillation) (HCC)    a. CHA2DS2VASc = 2;  b. takes amio/eliquis/metoprolol prn for PAF.    Past Surgical History:  Procedure Laterality Date   COLONOSCOPY WITH PROPOFOL N/A 12/29/2017   Procedure: COLONOSCOPY WITH PROPOFOL;  Surgeon: Andrew Silvas, MD;  Location: [facility] ENDOSCOPY;  Service:  Endoscopy;  Laterality: N/A;   FINGER SURGERY     left index finger   INGUINAL HERNIA REPAIR Right 2007   INGUINAL HERNIA REPAIR Left 2008   done x 2   KNEE SURGERY     left     Family History  Problem Relation Age of Onset   Hyperlipidemia Mother    Hypertension Mother    Heart disease Mother    Heart disease Sister        valve repair    Diabetes Brother    Hypertension Brother     Social History Social History   Tobacco Use   Smoking status: Former Smoker    Packs/day: 1.00    Years: 20.00    Pack years: 20.00    Types: Cigarettes    Quit date: 02/01/1993    Years since quitting: 28.1   Smokeless tobacco: Never Used  Vaping Use   Vaping Use: Never used  Substance Use Topics   Alcohol use: Yes    Alcohol/week: 0.0 standard drinks    Comment: social drinker.   Drug use: No    Allergies  Allergen Reactions   Penicillins     Rash Rash     Current Outpatient Medications  Medication Sig Dispense Refill   ELIQUIS 5 MG TABS tablet TAKE 1 TABLET BY MOUTH TWICE A DAY 60  tablet 11   lisinopril (ZESTRIL) 2.5 MG tablet TAKE 1 TABLET BY MOUTH EVERY DAY 90 tablet 3   Misc Natural Products (PROSTATE HEALTH PO) Take by mouth.     Multiple Vitamin (MULTIVITAMIN) capsule Take 1 capsule by mouth daily.     sildenafil (REVATIO) 20 MG tablet 3-5 po tablets daily as needed 90 tablet 3   tamsulosin (FLOMAX) 0.4 MG CAPS capsule TAKE 1 CAPSULE BY MOUTH EVERY DAY 90 capsule 1   rosuvastatin (CRESTOR) 20 MG tablet TAKE 1 TABLET BY MOUTH EVERY DAY (Patient not taking: No sig reported) 90 tablet 3   No current facility-administered medications for this visit.    Review of Systems Review of Systems  All other systems reviewed and are negative. Or as discussed in the history of present illness.  Blood pressure 114/79, pulse 80, temperature 98.3 F (36.8 C), height 5\' 11"  (1.803 m), weight 182 lb (82.6 kg), SpO2 96 %. Body mass index is 25.38  kg/m.  Physical Exam Physical Exam Vitals reviewed. Exam conducted with a chaperone present.  Constitutional:      General: He is not in acute distress.    Appearance: Normal appearance. He is normal weight.  HENT:     Head: Normocephalic and atraumatic.     Nose:     Comments: Covered with a mask    Mouth/Throat:     Comments: Covered with a mask Eyes:     General: No scleral icterus.       Right eye: No discharge.        Left eye: No discharge.  Neck:     Comments: The trachea is midline.  There is no palpable cervical or supraclavicular lymphadenopathy.  No thyromegaly or dominant thyroid masses appreciated.  The gland moves freely with deglutition. Cardiovascular:     Rate and Rhythm: Normal rate and regular rhythm.     Pulses: Normal pulses.  Pulmonary:     Effort: Pulmonary effort is normal.     Breath sounds: Normal breath sounds.  Abdominal:     General: Abdomen is flat. Bowel sounds are normal.     Palpations: Abdomen is soft.  Genitourinary:    Comments: No hernia appreciated in either inguinal canal. Musculoskeletal:        General: No swelling or tenderness.  Skin:    General: Skin is warm and dry.  Neurological:     General: No focal deficit present.     Mental Status: He is alert and oriented to person, place, and time.  Psychiatric:        Mood and Affect: Mood normal.        Behavior: Behavior normal.     Data Reviewed As per the history of present illness, I reviewed imaging performed in 2020 as well as Andrew Benson clinic note which led to the referral to this office.  Assessment This is a 75 year old man who had onset of right-sided groin pain after 4 days of heavy lifting.  He says that it has improved over time.  I do not appreciate any hernia in either the right or the left groin.  Andrew Benson did volunteer, near the end of our visit, that the odd sensation in his right groin had actually been there for a number of years and that his primary  concern was specifically related to the fact that he had had multiple prior hernia operations.  Plan No hernia was appreciated on examination today.  I did offer Andrew Benson the  possibility of undergoing a CT scan to see if any findings might be revealed via this modality.  Alternatively, an ultrasound could be done for the same purpose.  At this time, he feels that he is improving and does not wish to pursue further investigation.  If he changes his mind in the future, he is welcome to contact our office and we will make the appropriate arrangements and referrals.  I will see him on an as-needed basis.    Andrew Benson 03/13/2021, 3:37 PM

## 2021-03-13 NOTE — Patient Instructions (Signed)
You may use Ibuprofen 400-600 mg three times a day to help with any discomfort.   If you decide you would like some imaging done just give [place] a call.  Follow-up with our office as needed.  Please call and ask to speak with a nurse if you develop questions or concerns.

## 2021-03-22 ENCOUNTER — Other Ambulatory Visit: Payer: Self-pay | Admitting: Cardiovascular Disease

## 2021-03-22 DIAGNOSIS — I10 Essential (primary) hypertension: Secondary | ICD-10-CM

## 2021-04-15 ENCOUNTER — Other Ambulatory Visit: Payer: Self-pay | Admitting: Urology

## 2021-04-15 ENCOUNTER — Telehealth: Payer: Self-pay | Admitting: Cardiovascular Disease

## 2021-04-15 NOTE — Telephone Encounter (Signed)
Clinical pharmacist review Eliquis taken for A. fib.

## 2021-04-15 NOTE — Telephone Encounter (Signed)
Patient with diagnosis of afib on Eliquis for anticoagulation.    Procedure: colonoscopy Date of procedure: 07/02/21  CHA2DS2-VASc Score = 2  This indicates a 2.2% annual risk of stroke. The patient's score is based upon: CHF History: No HTN History: Yes Diabetes History: No Stroke History: No Vascular Disease History: No Age Score: 1 Gender Score: 0   CrCl 53mL/min Platelet count 135K  Per office protocol, patient can hold Eliquis for 1-2 days prior to procedure.

## 2021-04-15 NOTE — Telephone Encounter (Signed)
   [facility] Pre-operative Risk Assessment    Patient Name: Andrew Benson  DOB: Jan 29, 1946  MRN: [id number]   HEARTCARE STAFF: - Please ensure there is not already an duplicate clearance open for this procedure. - Under Visit Info/Reason for Call, type in Other and utilize the format Clearance MM/DD/YY or Clearance TBD. Do not use dashes or single digits. - If request is for dental extraction, please clarify the # of teeth to be extracted.  Request for surgical clearance:  1. What type of surgery is being performed? Colonoscopy   2. When is this surgery scheduled? 07/02/21  3. What type of clearance is required (medical clearance vs. Pharmacy clearance to hold med vs. Both)? both  4. Are there any medications that need to be held prior to surgery and how long? Eliquis instructions   5. Practice name and name of physician performing surgery? [facility] - Laurine Blazer, [place]  6. What is the office phone number? [phone]   7.   What is the office fax number? [phone]  8.   Anesthesia type (None, local, MAC, general) ? Monitored    Ace Gins 04/15/2021, 2:49 PM  _________________________________________________________________   (provider comments below)

## 2021-04-16 NOTE — Telephone Encounter (Signed)
   Name: Andrew Benson  DOB: 1946/05/22  MRN: [id number]   Primary Cardiologist: No primary care provider on file.  Chart reviewed as part of pre-operative protocol coverage. Patient was contacted 04/16/2021 in reference to pre-operative risk assessment for pending surgery as outlined below.  Andrew Benson was last seen on 01/20/2021 by Dr. Rockey Situ.  Since that day, Andrew Benson has done well without any chest discomfort or worsening dyspnea.  Therefore, based on ACC/AHA guidelines, the patient would be at acceptable risk for the planned procedure without further cardiovascular testing.   Per our clinical pharmacist, patient may hold Eliquis for 1 to 2 days prior to the procedure and restart as soon as possible afterward at his GI doctor's discretion.  The patient was advised that if he develops new symptoms prior to surgery to contact our office to arrange for a follow-up visit, and he verbalized understanding.  I will route this recommendation to the requesting party via Epic fax function and remove from pre-op pool. Please call with questions.  [place], [place] 04/16/2021, 1:08 PM

## 2021-07-02 ENCOUNTER — Ambulatory Visit
Admission: RE | Admit: 2021-07-02 | Discharge: 2021-07-02 | Disposition: A | Discharge status: Home / Self Care | Payer: Medicare Other | Attending: Internal Medicine | Admitting: Internal Medicine

## 2021-07-02 ENCOUNTER — Ambulatory Visit: Payer: Medicare Other | Admitting: Anesthesiology

## 2021-07-02 ENCOUNTER — Other Ambulatory Visit: Payer: Self-pay | Admitting: Family Medicine

## 2021-07-02 ENCOUNTER — Other Ambulatory Visit: Payer: Self-pay

## 2021-07-02 ENCOUNTER — Encounter
Admission: RE | Disposition: A | Discharge status: Home / Self Care | Payer: Self-pay | Source: Home / Self Care | Attending: Internal Medicine

## 2021-07-02 ENCOUNTER — Encounter: Payer: Self-pay | Admitting: Internal Medicine

## 2021-07-02 DIAGNOSIS — R339 Retention of urine, unspecified: Secondary | ICD-10-CM

## 2021-07-02 DIAGNOSIS — D123 Benign neoplasm of transverse colon: Secondary | ICD-10-CM | POA: Diagnosis not present

## 2021-07-02 DIAGNOSIS — K640 First degree hemorrhoids: Secondary | ICD-10-CM | POA: Diagnosis not present

## 2021-07-02 DIAGNOSIS — Z87891 Personal history of nicotine dependence: Secondary | ICD-10-CM | POA: Diagnosis not present

## 2021-07-02 DIAGNOSIS — N401 Enlarged prostate with lower urinary tract symptoms: Secondary | ICD-10-CM

## 2021-07-02 DIAGNOSIS — K5730 Diverticulosis of large intestine without perforation or abscess without bleeding: Secondary | ICD-10-CM | POA: Diagnosis not present

## 2021-07-02 DIAGNOSIS — Z880 Allergy status to penicillin: Secondary | ICD-10-CM | POA: Diagnosis not present

## 2021-07-02 DIAGNOSIS — Z7901 Long term (current) use of anticoagulants: Secondary | ICD-10-CM | POA: Insufficient documentation

## 2021-07-02 DIAGNOSIS — N138 Other obstructive and reflux uropathy: Secondary | ICD-10-CM

## 2021-07-02 DIAGNOSIS — Z09 Encounter for follow-up examination after completed treatment for conditions other than malignant neoplasm: Secondary | ICD-10-CM | POA: Diagnosis present

## 2021-07-02 DIAGNOSIS — Z79899 Other long term (current) drug therapy: Secondary | ICD-10-CM | POA: Insufficient documentation

## 2021-07-02 HISTORY — DX: Other specified disorders of temporomandibular joint: M26.69

## 2021-07-02 HISTORY — PX: COLONOSCOPY WITH PROPOFOL: SHX5780

## 2021-07-02 SURGERY — COLONOSCOPY WITH PROPOFOL
Anesthesia: General

## 2021-07-02 MED ORDER — SODIUM CHLORIDE 0.9 % IV SOLN: INTRAVENOUS | Status: DC

## 2021-07-02 MED ORDER — PROPOFOL 500 MG/50ML IV EMUL
INTRAVENOUS | Status: DC | PRN
  Administered 2021-07-02: 120 ug/kg/min via INTRAVENOUS

## 2021-07-02 MED ORDER — PROPOFOL 500 MG/50ML IV EMUL
INTRAVENOUS | Status: AC
  Filled 2021-07-02: qty 50

## 2021-07-02 NOTE — H&P (Signed)
Outpatient short stay form Pre-procedure 07/02/2021 12:54 PM Andrew Benson K. Alice Reichert, M.D.  Primary Physician: [name], M.D.  Reason for visit:  Personal history of adenomatous colon polyps  History of present illness:                            Patient presents for colonoscopy for a personal hx of colon polyps. The patient denies abdominal pain, abnormal weight loss or rectal bleeding.      Current Facility-Administered Medications:    0.9 %  sodium chloride infusion, , Intravenous, Continuous, [name], [name], MD, Last Rate: 20 mL/hr at 07/02/21 1245, New Bag at 07/02/21 1245  Medications Prior to Admission  Medication Sig Dispense Refill Last Dose   amiodarone (PACERONE) 200 MG tablet Take 200 mg by mouth daily.      baclofen (LIORESAL) 10 MG tablet Take 10 mg by mouth 3 (three) times daily.      Coenzyme Q10 10 MG capsule Take 30 mg by mouth 3 (three) times daily.   Past Month   lisinopril (ZESTRIL) 2.5 MG tablet TAKE 1 TABLET BY MOUTH EVERY DAY 90 tablet 3 07/01/2021   metoprolol tartrate (LOPRESSOR) 25 MG tablet Take 25 mg by mouth 2 (two) times daily.      Multiple Vitamin (MULTIVITAMIN) capsule Take 1 capsule by mouth daily.   07/01/2021   rosuvastatin (CRESTOR) 20 MG tablet TAKE 1 TABLET BY MOUTH EVERY DAY 90 tablet 3 07/01/2021   tamsulosin (FLOMAX) 0.4 MG CAPS capsule TAKE 1 CAPSULE BY MOUTH EVERY DAY 90 capsule 1 07/01/2021   ELIQUIS 5 MG TABS tablet TAKE 1 TABLET BY MOUTH TWICE A DAY 60 tablet 11 06/29/2021   Misc Natural Products (PROSTATE HEALTH PO) Take by mouth.      sildenafil (REVATIO) 20 MG tablet 3-5 TABLETS DAILY AS NEEDED 90 tablet 0      Allergies  Allergen Reactions   Penicillins     Rash Rash      Past Medical History:  Diagnosis Date   Asthma    Essential hypertension    Hyperlipidemia    PAF (paroxysmal atrial fibrillation) (HCC)    a. CHA2DS2VASc = 2;  b. takes amio/eliquis/metoprolol prn for PAF.   TMJ locking     Review of systems:   Otherwise negative.    Physical Exam  Gen: Alert, oriented. Appears stated age.  HEENT: /AT. PERRLA. Lungs: CTA, no wheezes. CV: RR nl S1, S2. Abd: soft, benign, no masses. BS+ Ext: No edema. Pulses 2+    Planned procedures: Proceed with colonoscopy. The patient understands the nature of the planned procedure, indications, risks, alternatives and potential complications including but not limited to bleeding, infection, perforation, damage to internal organs and possible oversedation/side effects from anesthesia. The patient agrees and gives consent to proceed.  Please refer to procedure notes for findings, recommendations and patient disposition/instructions.     [name] K. Alice Reichert, M.D. Gastroenterology 07/02/2021  12:54 PM

## 2021-07-02 NOTE — Anesthesia Procedure Notes (Signed)
Date/Time: 07/02/2021 1:45 PM Performed by: Vaughan Sine Pre-anesthesia Checklist: Patient identified, Emergency Drugs available, Suction available, Timeout performed and Patient being monitored Oxygen Delivery Method: Nasal cannula Preoxygenation: Pre-oxygenation with 100% oxygen Induction Type: IV induction Placement Confirmation: positive ETCO2 and CO2 detector

## 2021-07-02 NOTE — Op Note (Signed)
[facility] Gastroenterology Patient Name: Andrew Benson Procedure Date: 07/02/2021 1:31 PM MRN: [id number] Account #: [id number] Date of Birth: Dec 27, 1945 Admit Type: Outpatient Age: 75 Room: [facility] ENDO ROOM 4 Gender: Male Note Status: Finalized Procedure:             Colonoscopy Indications:           Surveillance: Personal history of adenomatous polyps                         on last colonoscopy > 3 years ago Providers:             Lorie Apley K. Alice Reichert MD, MD Referring MD:          Olin Hauser (Referring MD) Medicines:             Propofol per Anesthesia Complications:         No immediate complications. Procedure:             Pre-Anesthesia Assessment:                        - The risks and benefits of the procedure and the                         sedation options and risks were discussed with the                         patient. All questions were answered and informed                         consent was obtained.                        - Patient identification and proposed procedure were                         verified prior to the procedure by the nurse. The                         procedure was verified in the procedure room.                        - ASA Grade Assessment: III - A patient with severe                         systemic disease.                        - After reviewing the risks and benefits, the patient                         was deemed in satisfactory condition to undergo the                         procedure.                        After obtaining informed consent, the colonoscope was                         passed under direct vision.  Throughout the procedure,                         the patient's blood pressure, pulse, and oxygen                         saturations were monitored continuously. The                         Colonoscope was introduced through the anus and                         advanced to the the cecum,  identified by appendiceal                         orifice and ileocecal valve. The colonoscopy was                         performed without difficulty. The patient tolerated                         the procedure well. The quality of the bowel                         preparation was good. The ileocecal valve, appendiceal                         orifice, and rectum were photographed. Findings:      The perianal and digital rectal examinations were normal. Pertinent       negatives include normal sphincter tone and no palpable rectal lesions.      Non-bleeding internal hemorrhoids were found during retroflexion. The       hemorrhoids were Grade I (internal hemorrhoids that do not prolapse).      Many small and large-mouthed diverticula were found in the sigmoid       colon. There was no evidence of diverticular bleeding.      A 6 mm polyp was found in the splenic flexure. The polyp was       semi-pedunculated. The polyp was removed with a hot snare. Resection and       retrieval were complete.      The exam was otherwise without abnormality. Impression:            - Non-bleeding internal hemorrhoids.                        - Mild diverticulosis in the sigmoid colon. There was                         no evidence of diverticular bleeding.                        - One 6 mm polyp at the splenic flexure, removed with                         a hot snare. Resected and retrieved.                        - The examination was otherwise normal. Recommendation:        -  Patient has a contact number available for                         emergencies. The signs and symptoms of potential                         delayed complications were discussed with the patient.                         Return to normal activities tomorrow. Written                         discharge instructions were provided to the patient.                        - Resume previous diet.                        - Continue present  medications.                        - Repeat colonoscopy is recommended for surveillance.                         The colonoscopy date will be determined after                         pathology results from today's exam become available                         for review.                        - Return to GI office PRN.                        - The findings and recommendations were discussed with                         the patient. Procedure Code(s):     --- Professional ---                        [phone], Colonoscopy, flexible; with removal of                         tumor(s), polyp(s), or other lesion(s) by snare                         technique Diagnosis Code(s):     --- Professional ---                        K57.30, Diverticulosis of large intestine without                         perforation or abscess without bleeding                        K63.5, Polyp of colon  K64.0, First degree hemorrhoids                        Z86.010, Personal history of colonic polyps CPT copyright 2019 [employer]. All rights reserved. The codes documented in this report are preliminary and upon coder review may  be revised to meet current compliance requirements. Efrain Sella MD, MD 07/02/2021 1:58:12 PM This report has been signed electronically. Number of Addenda: 0 Note Initiated On: 07/02/2021 1:31 PM Scope Withdrawal Time: 0 hours 5 minutes 11 seconds  Total Procedure Duration: 0 hours 9 minutes 21 seconds  Estimated Blood Loss:  Estimated blood loss: none.      [facility]

## 2021-07-02 NOTE — Transfer of Care (Signed)
Immediate Anesthesia Transfer of Care Note  Patient: Andrew Benson  Procedure(s) Performed: COLONOSCOPY WITH PROPOFOL  Patient Location: PACU  Anesthesia Type:General  Level of Consciousness: awake and sedated  Airway & Oxygen Therapy: Patient Spontanous Breathing and Patient connected to nasal cannula oxygen  Post-op Assessment: Report given to RN and Post -op Vital signs reviewed and stable  Post vital signs: Reviewed and stable  Last Vitals:  Vitals Value Taken Time  BP    Temp    Pulse    Resp    SpO2      Last Pain:  Vitals:   07/02/21 1223  TempSrc: Temporal  PainSc: 0-No pain         Complications: No notable events documented.

## 2021-07-02 NOTE — Interval H&P Note (Signed)
History and Physical Interval Note:  07/02/2021 12:55 PM  Andrew Benson  has presented today for surgery, with the diagnosis of PERSONAL HX.OF POLYPS.  The various methods of treatment have been discussed with the patient and family. After consideration of risks, benefits and other options for treatment, the patient has consented to  Procedure(s): COLONOSCOPY WITH PROPOFOL (N/A) as a surgical intervention.  The patient's history has been reviewed, patient examined, no change in status, stable for surgery.  I have reviewed the patient's chart and labs.  Questions were answered to the patient's satisfaction.     [place], [place]

## 2021-07-03 ENCOUNTER — Encounter: Payer: Self-pay | Admitting: Internal Medicine

## 2021-07-03 LAB — SURGICAL PATHOLOGY

## 2021-07-03 NOTE — Anesthesia Postprocedure Evaluation (Signed)
Anesthesia Post Note  Patient: Andrew Benson  Procedure(s) Performed: COLONOSCOPY WITH PROPOFOL  Patient location during evaluation: PACU Anesthesia Type: General Level of consciousness: awake and alert, awake and oriented Pain management: pain level controlled Vital Signs Assessment: post-procedure vital signs reviewed and stable Respiratory status: spontaneous breathing, nonlabored ventilation and respiratory function stable Cardiovascular status: blood pressure returned to baseline and stable Postop Assessment: no apparent nausea or vomiting Anesthetic complications: no   No notable events documented.   Last Vitals:  Vitals:   07/02/21 1408 07/02/21 1418  BP: 116/87 130/81  Pulse: 73 63  Resp: 16 17  Temp:    SpO2: 97% 99%    Last Pain:  Vitals:   07/02/21 1418  TempSrc:   PainSc: 0-No pain                 Phill Mutter

## 2021-07-03 NOTE — Anesthesia Preprocedure Evaluation (Signed)
Anesthesia Evaluation  Patient identified by MRN, date of birth, ID band Patient awake    Reviewed: Allergy & Precautions, NPO status , Patient's Chart, lab work & pertinent test results, reviewed documented beta blocker date and time   Airway Mallampati: II  TM Distance: >3 FB     Dental  (+) Teeth Intact   Pulmonary asthma , former smoker,    Pulmonary exam normal        Cardiovascular hypertension, Pt. on medications and Pt. on home beta blockers Normal cardiovascular exam+ dysrhythmias Atrial Fibrillation      Neuro/Psych negative neurological ROS  negative psych ROS   GI/Hepatic negative GI ROS, Neg liver ROS,   Endo/Other  Hypothyroidism   Renal/GU Renal InsufficiencyRenal disease  negative genitourinary   Musculoskeletal negative musculoskeletal ROS (+)   Abdominal Normal abdominal exam  (+)   Peds negative pediatric ROS (+)  Hematology negative hematology ROS (+)   Anesthesia Other Findings   Reproductive/Obstetrics                             Anesthesia Physical  Anesthesia Plan  ASA: III  Anesthesia Plan: General   Post-op Pain Management:    Induction: Intravenous  PONV Risk Score and Plan:   Airway Management Planned: Nasal Cannula  Additional Equipment:   Intra-op Plan:   Post-operative Plan:   Informed Consent: I have reviewed the patients History and Physical, chart, labs and discussed the procedure including the risks, benefits and alternatives for the proposed anesthesia with the patient or authorized representative who has indicated his/her understanding and acceptance.     Dental advisory given  Plan Discussed with: CRNA and Surgeon  Anesthesia Plan Comments:         Anesthesia Quick Evaluation

## 2021-07-07 ENCOUNTER — Other Ambulatory Visit: Payer: Self-pay

## 2021-07-07 ENCOUNTER — Other Ambulatory Visit: Payer: Medicare Other

## 2021-07-07 DIAGNOSIS — Z87898 Personal history of other specified conditions: Secondary | ICD-10-CM

## 2021-07-08 LAB — PSA: Prostate Specific Ag, Serum: 4.7 ng/mL — ABNORMAL HIGH (ref 0.0–4.0)

## 2021-07-09 ENCOUNTER — Other Ambulatory Visit: Payer: Self-pay

## 2021-07-09 ENCOUNTER — Ambulatory Visit: Payer: Medicare Other | Admitting: Urology

## 2021-07-09 VITALS — BP 152/78 | HR 88

## 2021-07-09 DIAGNOSIS — N138 Other obstructive and reflux uropathy: Secondary | ICD-10-CM | POA: Diagnosis not present

## 2021-07-09 DIAGNOSIS — N5203 Combined arterial insufficiency and corporo-venous occlusive erectile dysfunction: Secondary | ICD-10-CM | POA: Diagnosis not present

## 2021-07-09 DIAGNOSIS — N401 Enlarged prostate with lower urinary tract symptoms: Secondary | ICD-10-CM

## 2021-07-09 DIAGNOSIS — Z87898 Personal history of other specified conditions: Secondary | ICD-10-CM | POA: Diagnosis not present

## 2021-07-09 LAB — BLADDER SCAN AMB NON-IMAGING: Scan Result: 6

## 2021-07-09 MED ORDER — SILDENAFIL CITRATE 20 MG PO TABS: ORAL_TABLET | ORAL | 2 refills | Status: DC

## 2021-07-09 NOTE — Progress Notes (Signed)
07/09/2021 9:51 AM   Andrew Benson 11/25/1946 [id number]  Referring provider: Olin Hauser, DO [address] [place],  [place] [address]  Chief Complaint  Patient presents with   Elevated PSA    HPI: 75 year old male with a personal history of BPH with obstructive urinary symptoms and elevated PSA who returns today for routine annual follow-up.  Personal history of elevated PSA.  Is a has been fluctuating in the 2-5 range over the past several years.  Prostatic volume 101 g based on estimated size on MRI.  MRI was completed on 01/26/2019.  There was evidence of moderate BPH with a significant median lobe but no other suspicious lesions.  Recent PSA 4.7 down from 5.7 a year ago performed on 01/07/2021.  In terms of urinary symptoms, he continues on Flomax.  In terms of urination, he feels like he is doing quite well.  IPSS as below.  He has no specific urinary complaints today.  He also has a personal history of erectile dysfunction and uses sildenafil as needed.  He reports today that there was some confusion about where the prescription was previously sent and never is able to fill it.  He is requesting refills today and looking for cost effective options.     IPSS     Row Name 07/09/21 1000         International Prostate Symptom Score   How often have you had the sensation of not emptying your bladder? Less than half the time     How often have you had to urinate less than every two hours? Less than 1 in 5 times     How often have you found you stopped and started again several times when you urinated? Less than 1 in 5 times     How often have you found it difficult to postpone urination? Less than 1 in 5 times     How often have you had a weak urinary stream? Less than 1 in 5 times     How often have you had to strain to start urination? Less than 1 in 5 times     How many times did you typically get up at night to urinate? 1 Time     Total IPSS Score 8            Quality of Life due to urinary symptoms     If you were to spend the rest of your life with your urinary condition just the way it is now how would you feel about that? Mostly Satisfied             Score:  1-7 Mild 8-19 Moderate 20-35 Severe   PMH: Past Medical History:  Diagnosis Date   Asthma    Essential hypertension    Hyperlipidemia    PAF (paroxysmal atrial fibrillation) (HCC)    a. CHA2DS2VASc = 2;  b. takes amio/eliquis/metoprolol prn for PAF.   TMJ locking     Surgical History: Past Surgical History:  Procedure Laterality Date   COLONOSCOPY WITH PROPOFOL N/A 12/29/2017   Procedure: COLONOSCOPY WITH PROPOFOL;  Surgeon: Manya Silvas, MD;  Location: [facility] ENDOSCOPY;  Service: Endoscopy;  Laterality: N/A;   COLONOSCOPY WITH PROPOFOL N/A 07/02/2021   Procedure: COLONOSCOPY WITH PROPOFOL;  Surgeon: Toledo, [name], MD;  Location: [facility] ENDOSCOPY;  Service: Gastroenterology;  Laterality: N/A;   FINGER SURGERY     left index finger   INGUINAL HERNIA REPAIR Right 2007   INGUINAL  HERNIA REPAIR Left 2008   done x 2   KNEE SURGERY     left     Home Medications:  Allergies as of 07/09/2021       Reactions   Penicillins    Rash Rash        Medication List        Accurate as of July 09, 2021 11:59 PM. If you have any questions, ask your nurse or doctor.          STOP taking these medications    Coenzyme Q10 10 MG capsule Stopped by: Hollice Espy, MD       TAKE these medications    amiodarone 200 MG tablet Commonly known as: PACERONE Take 200 mg by mouth daily.   baclofen 10 MG tablet Commonly known as: LIORESAL Take 10 mg by mouth 3 (three) times daily.   Eliquis 5 MG Tabs tablet Generic drug: apixaban TAKE 1 TABLET BY MOUTH TWICE A DAY   lisinopril 2.5 MG tablet Commonly known as: ZESTRIL TAKE 1 TABLET BY MOUTH EVERY DAY   metoprolol tartrate 25 MG tablet Commonly known as: LOPRESSOR Take 25 mg by mouth 2 (two) times daily.    multivitamin capsule Take 1 capsule by mouth daily.   PROSTATE HEALTH PO Take by mouth.   rosuvastatin 20 MG tablet Commonly known as: CRESTOR TAKE 1 TABLET BY MOUTH EVERY DAY   sildenafil 20 MG tablet Commonly known as: REVATIO 3-5 TABLETS DAILY AS NEEDED   tamsulosin 0.4 MG Caps capsule Commonly known as: FLOMAX TAKE 1 CAPSULE BY MOUTH EVERY DAY        Allergies:  Allergies  Allergen Reactions   Penicillins     Rash Rash     Family History: Family History  Problem Relation Age of Onset   Hyperlipidemia Mother    Hypertension Mother    Heart disease Mother    Heart disease Sister        valve repair    Diabetes Brother    Hypertension Brother     Social History:  reports that he quit smoking about 28 years ago. His smoking use included cigarettes. He has a 20.00 pack-year smoking history. He has never used smokeless tobacco. He reports current alcohol use. He reports that he does not use drugs.   Physical Exam: BP (!) 152/78   Pulse 88   Constitutional:  Alert and oriented, No acute distress. HEENT: [place] AT, moist mucus membranes.  Trachea midline, no masses. Cardiovascular: No clubbing, cyanosis, or edema. Respiratory: Normal respiratory effort, no increased work of breathing. Rectal: Normal sphincter tone.  Enlarged prostate, rubbery lateral lobes, greater than 50 cc, unable to palpate the base due to prostate size Skin: No rashes, bruises or suspicious lesions. Neurologic: Grossly intact, no focal deficits, moving all 4 extremities. Psychiatric: Normal mood and affect.  Pertinent Imaging: Results for orders placed or performed in visit on 07/09/21  BLADDER SCAN AMB NON-IMAGING  Result Value Ref Range   Scan Result 6     Assessment & Plan:    1. Benign localized hyperplasia of prostate with urinary obstruction Symptoms stable and well-controlled on Flomax only, doing well on this prescription.  No side effects.  Does not desire an outlet  procedure at this time. - BLADDER SCAN AMB NON-IMAGING  2. History of elevated PSA Personal history of elevated PSA although likely appropriate for gland size and age  Overall stability is reassuring, rectal exam consistent with prostamegaly  Will continue to  follow to ensure stability - BLADDER SCAN AMB NON-IMAGING  3. Combined arterial insufficiency and corporo-venous occlusive erectile dysfunction Previously did well with sildenafil, we discussed options including filling the prescription at [facility] with good Rx coupon which was supplied today  Follow-up in 1 year for IPSS/PVR/PSA  Hollice Espy, MD  [place] [address] [place], [place] [address] [phone]

## 2021-09-11 ENCOUNTER — Other Ambulatory Visit: Payer: Self-pay

## 2021-09-11 ENCOUNTER — Ambulatory Visit (INDEPENDENT_AMBULATORY_CARE_PROVIDER_SITE_OTHER): Payer: Medicare Other | Admitting: Family Medicine

## 2021-09-11 VITALS — Wt 180.0 lb

## 2021-09-11 DIAGNOSIS — Z23 Encounter for immunization: Secondary | ICD-10-CM | POA: Diagnosis not present

## 2021-09-25 ENCOUNTER — Ambulatory Visit: Payer: Medicare Other | Admitting: Family Medicine

## 2021-10-09 ENCOUNTER — Encounter: Payer: Self-pay | Admitting: General Surgery

## 2021-11-06 ENCOUNTER — Ambulatory Visit: Payer: Medicare Other

## 2021-11-11 ENCOUNTER — Ambulatory Visit: Payer: Medicare Other

## 2021-11-28 ENCOUNTER — Ambulatory Visit (INDEPENDENT_AMBULATORY_CARE_PROVIDER_SITE_OTHER): Payer: Medicare Other

## 2021-11-28 ENCOUNTER — Other Ambulatory Visit: Payer: Self-pay

## 2021-11-28 VITALS — BP 100/64 | HR 83 | Temp 98.0°F | Ht 71.0 in | Wt 182.0 lb

## 2021-11-28 DIAGNOSIS — Z0000 Encounter for general adult medical examination without abnormal findings: Secondary | ICD-10-CM | POA: Diagnosis not present

## 2021-11-28 NOTE — Patient Instructions (Signed)
Andrew Benson , Thank you for taking time to come for your Medicare Wellness Visit. I appreciate your ongoing commitment to your health goals. Please review the following plan we discussed and let me know if I can assist you in the future.   Screening recommendations/referrals: Colonoscopy: 07/02/21 Recommended yearly ophthalmology/optometry visit for glaucoma screening and checkup Recommended yearly dental visit for hygiene and checkup  Vaccinations: Influenza vaccine: 09/11/21 Pneumococcal vaccine: 11/13/14 Tdap vaccine: 12/22/07 Shingles vaccine: n/d   Covid-19: 02/02/20, 03/01/20, 06/04/21  Advanced directives: yes, requested copy  Conditions/risks identified: none  Next appointment: Follow up in one year for your annual wellness visit.   Preventive Care 12 Years and Older, Male Preventive care refers to lifestyle choices and visits with your health care provider that can promote health and wellness. What does preventive care include? A yearly physical exam. This is also called an annual well check. Dental exams once or twice a year. Routine eye exams. Ask your health care provider how often you should have your eyes checked. Personal lifestyle choices, including: Daily care of your teeth and gums. Regular physical activity. Eating a healthy diet. Avoiding tobacco and drug use. Limiting alcohol use. Practicing safe sex. Taking low doses of aspirin every day. Taking vitamin and mineral supplements as recommended by your health care provider. What happens during an annual well check? The services and screenings done by your health care provider during your annual well check will depend on your age, overall health, lifestyle risk factors, and family history of disease. Counseling  Your health care provider may ask you questions about your: Alcohol use. Tobacco use. Drug use. Emotional well-being. Home and relationship well-being. Sexual activity. Eating habits. History of  falls. Memory and ability to understand (cognition). Work and work Statistician. Screening  You may have the following tests or measurements: Height, weight, and BMI. Blood pressure. Lipid and cholesterol levels. These may be checked every 5 years, or more frequently if you are over 86 years old. Skin check. Lung cancer screening. You may have this screening every year starting at age 46 if you have a 30-pack-year history of smoking and currently smoke or have quit within the past 15 years. Fecal occult blood test (FOBT) of the stool. You may have this test every year starting at age 60. Flexible sigmoidoscopy or colonoscopy. You may have a sigmoidoscopy every 5 years or a colonoscopy every 10 years starting at age 32. Prostate cancer screening. Recommendations will vary depending on your family history and other risks. Hepatitis C blood test. Hepatitis B blood test. Sexually transmitted disease (STD) testing. Diabetes screening. This is done by checking your blood sugar (glucose) after you have not eaten for a while (fasting). You may have this done every 1-3 years. Abdominal aortic aneurysm (AAA) screening. You may need this if you are a current or former smoker. Osteoporosis. You may be screened starting at age 69 if you are at high risk. Talk with your health care provider about your test results, treatment options, and if necessary, the need for more tests. Vaccines  Your health care provider may recommend certain vaccines, such as: Influenza vaccine. This is recommended every year. Tetanus, diphtheria, and acellular pertussis (Tdap, Td) vaccine. You may need a Td booster every 10 years. Zoster vaccine. You may need this after age 73. Pneumococcal 13-valent conjugate (PCV13) vaccine. One dose is recommended after age 67. Pneumococcal polysaccharide (PPSV23) vaccine. One dose is recommended after age 53. Talk to your health care provider about which  screenings and vaccines you need and  how often you need them. This information is not intended to replace advice given to you by your health care provider. Make sure you discuss any questions you have with your health care provider. Document Released: 01/03/2016 Document Revised: 08/26/2016 Document Reviewed: 10/08/2015 Elsevier Interactive Patient Education  2017 [place] Prevention in the Home Falls can cause injuries. They can happen to people of all ages. There are many things you can do to make your home safe and to help prevent falls. What can I do on the outside of my home? Regularly fix the edges of walkways and driveways and fix any cracks. Remove anything that might make you trip as you walk through a door, such as a raised step or threshold. Trim any bushes or trees on the path to your home. Use bright outdoor lighting. Clear any walking paths of anything that might make someone trip, such as rocks or tools. Regularly check to see if handrails are loose or broken. Make sure that both sides of any steps have handrails. Any raised decks and porches should have guardrails on the edges. Have any leaves, snow, or ice cleared regularly. Use sand or salt on walking paths during winter. Clean up any spills in your garage right away. This includes oil or grease spills. What can I do in the bathroom? Use night lights. Install grab bars by the toilet and in the tub and shower. Do not use towel bars as grab bars. Use non-skid mats or decals in the tub or shower. If you need to sit down in the shower, use a plastic, non-slip stool. Keep the floor dry. Clean up any water that spills on the floor as soon as it happens. Remove soap buildup in the tub or shower regularly. Attach bath mats securely with double-sided non-slip rug tape. Do not have throw rugs and other things on the floor that can make you trip. What can I do in the bedroom? Use night lights. Make sure that you have a light by your bed that is easy to  reach. Do not use any sheets or blankets that are too big for your bed. They should not hang down onto the floor. Have a firm chair that has side arms. You can use this for support while you get dressed. Do not have throw rugs and other things on the floor that can make you trip. What can I do in the kitchen? Clean up any spills right away. Avoid walking on wet floors. Keep items that you use a lot in easy-to-reach places. If you need to reach something above you, use a strong step stool that has a grab bar. Keep electrical cords out of the way. Do not use floor polish or wax that makes floors slippery. If you must use wax, use non-skid floor wax. Do not have throw rugs and other things on the floor that can make you trip. What can I do with my stairs? Do not leave any items on the stairs. Make sure that there are handrails on both sides of the stairs and use them. Fix handrails that are broken or loose. Make sure that handrails are as long as the stairways. Check any carpeting to make sure that it is firmly attached to the stairs. Fix any carpet that is loose or worn. Avoid having throw rugs at the top or bottom of the stairs. If you do have throw rugs, attach them to the floor with carpet  tape. Make sure that you have a light switch at the top of the stairs and the bottom of the stairs. If you do not have them, ask someone to add them for you. What else can I do to help prevent falls? Wear shoes that: Do not have high heels. Have rubber bottoms. Are comfortable and fit you well. Are closed at the toe. Do not wear sandals. If you use a stepladder: Make sure that it is fully opened. Do not climb a closed stepladder. Make sure that both sides of the stepladder are locked into place. Ask someone to hold it for you, if possible. Clearly mark and make sure that you can see: Any grab bars or handrails. First and last steps. Where the edge of each step is. Use tools that help you move  around (mobility aids) if they are needed. These include: Canes. Walkers. Scooters. Crutches. Turn on the lights when you go into a dark area. Replace any light bulbs as soon as they burn out. Set up your furniture so you have a clear path. Avoid moving your furniture around. If any of your floors are uneven, fix them. If there are any pets around you, be aware of where they are. Review your medicines with your doctor. Some medicines can make you feel dizzy. This can increase your chance of falling. Ask your doctor what other things that you can do to help prevent falls. This information is not intended to replace advice given to you by your health care provider. Make sure you discuss any questions you have with your health care provider. Document Released: 10/03/2009 Document Revised: 05/14/2016 Document Reviewed: 01/11/2015 Elsevier Interactive Patient Education  2017 [employer].

## 2021-11-28 NOTE — Progress Notes (Signed)
Subjective:   Andrew Benson is a 75 y.o. male who presents for Medicare Annual/Subsequent preventive examination.  Review of Systems     Cardiac Risk Factors include: advanced age (>24men, >52 women);male gender     Objective:    Today's Vitals   11/28/21 0816  BP: 100/64  Pulse: 83  Temp: 98 F (36.7 C)  SpO2: 96%  Weight: 182 lb (82.6 kg)  Height: 5\' 11"  (1.803 m)   Body mass index is 25.38 kg/m.  Advanced Directives 11/28/2021 07/02/2021 11/05/2020 02/14/2019 02/01/2018 12/29/2017 11/07/2015  Does Patient Have a Medical Advance Directive? Yes No;Yes Yes Yes Yes Yes Yes  Type of Paramedic of [place];Living will [place];Living will [place];Living will Living will;Healthcare Power of [place];Living will Living will;Healthcare Power of [place];Living will  Does patient want to make changes to medical advance directive? Yes (Inpatient - patient defers changing a medical advance directive and declines information at this time) - - - - - -  Copy of [place] in Chart? No - copy requested - No - copy requested No - copy requested No - copy requested No - copy requested No - copy requested    Current Medications (verified) Outpatient Encounter Medications as of 11/28/2021  Medication Sig   amiodarone (PACERONE) 200 MG tablet Take 200 mg by mouth daily.   ELIQUIS 5 MG TABS tablet TAKE 1 TABLET BY MOUTH TWICE A DAY   lisinopril (ZESTRIL) 2.5 MG tablet TAKE 1 TABLET BY MOUTH EVERY DAY   metoprolol tartrate (LOPRESSOR) 25 MG tablet Take 25 mg by mouth 2 (two) times daily.   Misc Natural Products (PROSTATE HEALTH PO) Take by mouth.   Multiple Vitamin (MULTIVITAMIN) capsule Take 1 capsule by mouth daily.   rosuvastatin (CRESTOR) 20 MG tablet TAKE 1 TABLET BY MOUTH EVERY DAY   sildenafil (REVATIO) 20 MG tablet 3-5 TABLETS DAILY AS NEEDED    tamsulosin (FLOMAX) 0.4 MG CAPS capsule TAKE 1 CAPSULE BY MOUTH EVERY DAY   baclofen (LIORESAL) 10 MG tablet Take 10 mg by mouth 3 (three) times daily. (Patient not taking: Reported on 11/28/2021)   No facility-administered encounter medications on file as of 11/28/2021.    Allergies (verified) Penicillins   History: Past Medical History:  Diagnosis Date   Asthma    Essential hypertension    Hyperlipidemia    PAF (paroxysmal atrial fibrillation) (HCC)    a. CHA2DS2VASc = 2;  b. takes amio/eliquis/metoprolol prn for PAF.   TMJ locking    Past Surgical History:  Procedure Laterality Date   COLONOSCOPY WITH PROPOFOL N/A 12/29/2017   Procedure: COLONOSCOPY WITH PROPOFOL;  Surgeon: Manya Silvas, MD;  Location: [facility] ENDOSCOPY;  Service: Endoscopy;  Laterality: N/A;   COLONOSCOPY WITH PROPOFOL N/A 07/02/2021   Procedure: COLONOSCOPY WITH PROPOFOL;  Surgeon: Toledo, [name], MD;  Location: [facility] ENDOSCOPY;  Service: Gastroenterology;  Laterality: N/A;   FINGER SURGERY     left index finger   INGUINAL HERNIA REPAIR Right 2007   INGUINAL HERNIA REPAIR Left 2008   done x 2   KNEE SURGERY     left    Family History  Problem Relation Age of Onset   Hyperlipidemia Mother    Hypertension Mother    Heart disease Mother    Heart disease Sister        valve repair    Diabetes Brother    Hypertension Brother  Social History   Socioeconomic History   Marital status: Married    Spouse name: Not on file   Number of children: Not on file   Years of education: Not on file   Highest education level: Associate degree: academic program  Occupational History   Not on file  Tobacco Use   Smoking status: Former    Packs/day: 1.00    Years: 20.00    Pack years: 20.00    Types: Cigarettes    Quit date: 02/01/1993    Years since quitting: 28.8   Smokeless tobacco: Never  Vaping Use   Vaping Use: Never used  Substance and Sexual Activity   Alcohol use: Yes    Alcohol/week: 0.0  standard drinks    Comment: social drinker.   Drug use: No   Sexual activity: Not on file  Other Topics Concern   Not on file  Social History Narrative   Participates in church activities, and food bank 3 days a week   Gym 2 times weekly- treadmill    Social Determinants of Health   Financial Resource Strain: Low Risk    Difficulty of Paying Living Expenses: Not hard at all  Food Insecurity: No Food Insecurity   Worried About Charity fundraiser in the Last Year: Never true   Arboriculturist in the Last Year: Never true  Transportation Needs: No Transportation Needs   Lack of Transportation (Medical): No   Lack of Transportation (Non-Medical): No  Physical Activity: Sufficiently Active   Days of Exercise per Week: 5 days   Minutes of Exercise per Session: 60 min  Stress: No Stress Concern Present   Feeling of Stress : Not at all  Social Connections: Moderately Integrated   Frequency of Communication with Friends and Family: Twice a week   Frequency of Social Gatherings with Friends and Family: Twice a week   Attends Religious Services: More than 4 times per year   Active Member of [employer] or Organizations: No   Attends Music therapist: Never   Marital Status: Married    Tobacco Counseling Counseling given: Not Answered   Clinical Intake:  Pre-visit preparation completed: Yes  Pain : No/denies pain     Nutritional Risks: None Diabetes: No  How often do you need to have someone help you when you read instructions, pamphlets, or other written materials from your doctor or pharmacy?: 1 - Never  Diabetic?no  Interpreter Needed?: No  Information entered by :: Kirke Shaggy, LPN   Activities of Daily Living In your present state of health, do you have any difficulty performing the following activities: 11/28/2021  Hearing? N  Vision? N  Difficulty concentrating or making decisions? N  Walking or climbing stairs? N  Dressing or bathing? N  Doing  errands, shopping? N  Preparing Food and eating ? N  Using the Toilet? N  In the past six months, have you accidently leaked urine? N  Do you have problems with loss of bowel control? N  Managing your Medications? N  Managing your Finances? N  Housekeeping or managing your Housekeeping? N  Some recent data might be hidden    Patient Care Team: Olin Hauser, DO as PCP - General (Family Medicine) Rockey Situ Kathlene November, MD as Consulting Physician (Cardiology)  Indicate any recent Medical Services you may have received from other than [facility] providers in the past year (date may be approximate).     Assessment:   This is a routine wellness  examination for [facility].  Hearing/Vision screen No results found.  Dietary issues and exercise activities discussed: Current Exercise Habits: The patient has a physically strenuous job, but has no regular exercise apart from work.   Goals Addressed             This Visit's Progress    DIET - EAT MORE FRUITS AND VEGETABLES         Depression Screen PHQ 2/9 Scores 11/28/2021 11/28/2021 11/05/2020 09/04/2020 12/26/2019 10/13/2019 06/22/2019  PHQ - 2 Score 0 0 0 0 0 0 0    Fall Risk Fall Risk  11/28/2021 11/05/2020 09/04/2020 10/13/2019 06/21/2019  Falls in the past year? 0 0 0 0 0  Comment - - - - -  Number falls in past yr: 0 - 0 - -  Comment - - - - -  Injury with Fall? 0 - 0 - -  Risk for fall due to : No Fall Risks Medication side effect - - -  Follow up Falls prevention discussed Falls evaluation completed;Education provided;Falls prevention discussed Falls evaluation completed Falls evaluation completed Falls evaluation completed    FALL RISK PREVENTION PERTAINING TO THE HOME:  Any stairs in or around the home? Yes  If so, are there any without handrails? No  Home free of loose throw rugs in walkways, pet beds, electrical cords, etc? Yes  Adequate lighting in your home to reduce risk of falls? Yes   ASSISTIVE DEVICES UTILIZED TO  PREVENT FALLS:  Life alert? Yes  Use of a cane, walker or w/c? No  Grab bars in the bathroom? Yes  Shower chair or bench in shower? Yes  Elevated toilet seat or a handicapped toilet? Yes   TIMED UP AND GO:  Was the test performed? Yes .  Length of time to ambulate 10 feet: 4 sec.   Gait steady and fast without use of assistive device  Cognitive Function:     6CIT Screen 11/05/2020 02/14/2019  What Year? 0 points 0 points  What month? 0 points 0 points  What time? 0 points 0 points  Count back from 20 0 points 0 points  Months in reverse 0 points 0 points  Repeat phrase 0 points 0 points  Total Score 0 0    Immunizations Immunization History  Administered Date(s) Administered   Fluad Quad(high Dose 65+) 10/04/2019, 09/11/2021   Influenza,inj,Quad PF,6+ Mos 10/13/2016, 10/04/2017, 09/20/2018, 09/17/2020   Influenza-Unspecified 08/28/2013   Moderna Sars-Covid-2 Vaccination 02/02/2020, 03/01/2020   Pneumococcal Conjugate-13 11/13/2014   Pneumococcal Polysaccharide-23 10/16/2013   Tdap 12/22/2007   Zoster, Live 07/21/2014    TDAP status: Due, Education has been provided regarding the importance of this vaccine. Advised may receive this vaccine at local pharmacy or Health Dept. Aware to provide a copy of the vaccination record if obtained from local pharmacy or Health Dept. Verbalized acceptance and understanding.  Flu Vaccine status: Up to date  Pneumococcal vaccine status: Up to date  Covid-19 vaccine status: Completed vaccines  Qualifies for Shingles Vaccine? Yes   Zostavax completed Yes   Shingrix Completed?: No.    Education has been provided regarding the importance of this vaccine. Patient has been advised to call insurance company to determine out of pocket expense if they have not yet received this vaccine. Advised may also receive vaccine at local pharmacy or Health Dept. Verbalized acceptance and understanding.  Screening Tests Health Maintenance  Topic Date  Due   Zoster Vaccines- Shingrix (1 of 2) Never done   TETANUS/TDAP  12/21/2017   COVID-19 Vaccine (4 - Booster for Moderna series) 07/30/2021   COLONOSCOPY (Pts 45-30yrs Insurance coverage will need to be confirmed)  07/02/2024   Pneumonia Vaccine 34+ Years old  Completed   INFLUENZA VACCINE  Completed   Hepatitis C Screening  Completed   HPV VACCINES  Aged Out    Health Maintenance  Health Maintenance Due  Topic Date Due   Zoster Vaccines- Shingrix (1 of 2) Never done   TETANUS/TDAP  12/21/2017   COVID-19 Vaccine (4 - Booster for Moderna series) 07/30/2021    Colorectal cancer screening: Type of screening: Colonoscopy. Completed 07/02/21. Repeat every 5 years  Lung Cancer Screening: (Low Dose CT Chest recommended if Age 63-80 years, 30 pack-year currently smoking OR have quit w/in 15years.) does not qualify.     Additional Screening:  Hepatitis C Screening: does qualify; Completed 08/30/20  Vision Screening: Recommended annual ophthalmology exams for early detection of glaucoma and other disorders of the eye. Is the patient up to date with their annual eye exam?  Yes  Who is the provider or what is the name of the office in which the patient attends annual eye exams? Dr.Beavis  Dental Screening: Recommended annual dental exams for proper oral hygiene  Community Resource Referral / Chronic Care Management: CRR required this visit?  No   CCM required this visit?  No      Plan:     I have personally reviewed and noted the following in the patient's chart:   Medical and social history Use of alcohol, tobacco or illicit drugs  Current medications and supplements including opioid prescriptions. Patient is not currently taking opioid prescriptions. Functional ability and status Nutritional status Physical activity Advanced directives List of other physicians Hospitalizations, surgeries, and ER visits in previous 12 months Vitals Screenings to include cognitive,  depression, and falls Referrals and appointments  In addition, I have reviewed and discussed with patient certain preventive protocols, quality metrics, and best practice recommendations. A written personalized care plan for preventive services as well as general preventive health recommendations were provided to patient.     Dionisio David, LPN   [id number]   Nurse Notes: none

## 2021-12-25 ENCOUNTER — Other Ambulatory Visit: Payer: Self-pay | Admitting: Family Medicine

## 2021-12-25 DIAGNOSIS — R339 Retention of urine, unspecified: Secondary | ICD-10-CM

## 2021-12-25 DIAGNOSIS — N138 Other obstructive and reflux uropathy: Secondary | ICD-10-CM

## 2021-12-25 DIAGNOSIS — N401 Enlarged prostate with lower urinary tract symptoms: Secondary | ICD-10-CM

## 2021-12-25 NOTE — Telephone Encounter (Signed)
Requested Prescriptions  Pending Prescriptions Disp Refills   tamsulosin (FLOMAX) 0.4 MG CAPS capsule [Pharmacy Med Name: TAMSULOSIN HCL 0.4 MG CAPSULE] 90 capsule 1    Sig: TAKE 1 CAPSULE BY MOUTH [place]     Urology: Alpha-Adrenergic Blocker Passed - 12/25/2021  1:38 AM      Passed - Last BP in normal range    BP Readings from Last 1 Encounters:  11/28/21 100/64         Passed - Valid encounter within last 12 months    Recent Outpatient Visits          3 months ago Needs flu shot   [place], DO   9 months ago Acute right-sided low back pain without sciatica   [place], DO   10 months ago Chronic left shoulder pain   [place], DO   1 year ago Annual physical exam   [facility] Olin Hauser, DO   2 years ago Benign hypertension with CKD (chronic kidney disease) stage III [facility])   [facility] Olin Hauser, DO      Future Appointments            In 6 months Hollice Espy, MD [place]

## 2022-02-03 ENCOUNTER — Telehealth: Payer: Self-pay | Admitting: Cardiovascular Disease

## 2022-02-03 NOTE — Telephone Encounter (Signed)
Refill request

## 2022-02-03 NOTE — Telephone Encounter (Signed)
Eliquis 5 mg refill request received. Patient is 76 years old, weight- 82.6 kg, Crea- 1.33 on 08/30/20 , Diagnosis- PAF, and last seen by Dr. Rockey Situ on 12/23/20. Pt is overdue for labs and appt w/ Dr. Rockey Situ.  Will send in a 30 day supply and route to scheduling.

## 2022-03-04 ENCOUNTER — Other Ambulatory Visit: Payer: Self-pay | Admitting: Cardiovascular Disease

## 2022-03-04 NOTE — Telephone Encounter (Signed)
Scheduled 4/3 [place] noted labs needed .  Patient states he has enough meds to last until visit .  ?

## 2022-03-04 NOTE — Telephone Encounter (Signed)
Eliquis 5 mg refill request received. Patient is 76 years old, weight- 82.6 kg, Crea- 1.13 on 08/30/20 via [facility], Diagnosis- PAF, and last seen by Dr. Rockey Situ on 01/20/21. Pt is overdue for labs and appt w/ provider.  Will send to scheduling.  ?

## 2022-03-04 NOTE — Telephone Encounter (Signed)
Please review

## 2022-03-16 ENCOUNTER — Other Ambulatory Visit: Payer: Self-pay | Admitting: Cardiovascular Disease

## 2022-03-16 DIAGNOSIS — I10 Essential (primary) hypertension: Secondary | ICD-10-CM

## 2022-03-22 NOTE — Progress Notes (Signed)
Cardiology Office Note ? ?Date:  03/23/2022  ? ?ID:  Andrew Benson, DOB 1946/06/13, MRN [id number] ? ?PCP:  Andrew Hauser, DO  ? ?Chief Complaint  ?Patient presents with  ? 12 month follow up   ?  "Doing well." Medications reviewed by the patient verbally.   ? ? ?HPI:  ?Andrew Benson is a pleasant 76 year old gentleman with PMH of  ?remote history of smoking for 20 years,  ?atrial fibrillation 25 years ago,  ?atrial fib 10/26/13  while he was on his tractor in 2015, developed acute onset of palpitations, presented to the emergency room  ?2019 episode in [place], 20 min ?who presents for routine followup for atrial fibrillation.  ? ?LOV 1/22 ?Active at food pantry, Moving boxes, ?No significant symptoms of angina or shortness of breath ? ?Has not had to take his amiodarone, metoprolol as needed doses for atrial fibrillation that he is aware of ? ?Bending over to his left side at times will get a sharp sensation up in the chest, heart area ?Goes away when he stands up ?This has happened rarely ? ?Previously reported some noncompliance with  Eliquis dosing ?He is taking his cholesterol medication every other day ? ?Had numbness down his leg for 1 month, resolved without intervention ? ?EKG personally reviewed by myself on todays visit ?Showing normal sinus rhythm rate 67 bpm ? ?Other past medical history reviewed ? early 2019 had been in [place] visiting family, large coffee ? developed profound tachycardia rate up to 200 bpm ?Lasted approximately 20 minutes before terminating ?Towards the tail end of the arrhythmia took metoprolol possibly amiodarone ? ?At that time reported having paroxysmal episodes of tachycardia rate up to 130s at times ? ? chads VASC  score of 2, age, blood pressure ? ?PMH:   has a past medical history of Asthma, Essential hypertension, Hyperlipidemia, PAF (paroxysmal atrial fibrillation) ([place]), and TMJ locking. ? ?PSH:    ?Past Surgical History:  ?Procedure Laterality Date  ? COLONOSCOPY  WITH PROPOFOL N/A 12/29/2017  ? Procedure: COLONOSCOPY WITH PROPOFOL;  Surgeon: Manya Silvas, MD;  Location: [facility] ENDOSCOPY;  Service: Endoscopy;  Laterality: N/A;  ? COLONOSCOPY WITH PROPOFOL N/A 07/02/2021  ? Procedure: COLONOSCOPY WITH PROPOFOL;  Surgeon: Toledo, [name], MD;  Location: [facility] ENDOSCOPY;  Service: Gastroenterology;  Laterality: N/A;  ? FINGER SURGERY    ? left index finger  ? INGUINAL HERNIA REPAIR Right 2007  ? INGUINAL HERNIA REPAIR Left 2008  ? done x 2  ? KNEE SURGERY    ? left   ? ? ?Current Outpatient Medications  ?Medication Sig Dispense Refill  ? amiodarone (PACERONE) 200 MG tablet Take 200 mg by mouth daily.    ? apixaban (ELIQUIS) 5 MG TABS tablet Take 1 tablet (5 mg total) by mouth 2 (two) times daily. NEED APPT w/ DR Rockey Situ FOR REFILLS 60 tablet 0  ? lisinopril (ZESTRIL) 2.5 MG tablet TAKE 1 TABLET BY MOUTH EVERY DAY 30 tablet 0  ? metoprolol tartrate (LOPRESSOR) 25 MG tablet Take 25 mg by mouth 2 (two) times daily.    ? Misc Natural Products (PROSTATE HEALTH PO) Take by mouth.    ? Multiple Vitamin (MULTIVITAMIN) capsule Take 1 capsule by mouth daily.    ? rosuvastatin (CRESTOR) 20 MG tablet TAKE 1 TABLET BY MOUTH EVERY DAY 90 tablet 3  ? sildenafil (REVATIO) 20 MG tablet 3-5 TABLETS DAILY AS NEEDED 90 tablet 2  ? tamsulosin (FLOMAX) 0.4 MG CAPS capsule TAKE 1 CAPSULE BY MOUTH  EVERY DAY 90 capsule 1  ? baclofen (LIORESAL) 10 MG tablet Take 10 mg by mouth 3 (three) times daily. (Patient not taking: Reported on 11/28/2021)    ? ?No current facility-administered medications for this visit.  ? ? ?Allergies:   Penicillins  ? ?Social History:  The patient  reports that he quit smoking about 29 years ago. His smoking use included cigarettes. He has a 20.00 pack-year smoking history. He has never used smokeless tobacco. He reports current alcohol use. He reports that he does not use drugs.  ? ?Family History:   family history includes Diabetes in his brother; Heart disease in his mother and  sister; Hyperlipidemia in his mother; Hypertension in his brother and mother.  ? ? ?Review of Systems: ?Review of Systems  ?Constitutional: Negative.   ?HENT: Negative.    ?Respiratory: Negative.    ?Cardiovascular: Negative.   ?Gastrointestinal: Negative.   ?Musculoskeletal: Negative.   ?Neurological: Negative.   ?Psychiatric/Behavioral: Negative.    ?All other systems reviewed and are negative. ? ?PHYSICAL EXAM: ?VS:  BP 122/76 (BP Location: Right Arm, Patient Position: Sitting, Cuff Size: Normal)   Pulse 85   Ht '5\' 11"'$  (1.803 m)   Wt 82.8 kg   SpO2 97%   BMI 25.45 kg/m?  , BMI Body mass index is 25.45 kg/m?Marland Kitchen ?Constitutional:  oriented to person, place, and time. No distress.  ?HENT:  ?Head: Grossly normal ?Eyes:  no discharge. No scleral icterus.  ?Neck: No JVD, no carotid bruits  ?Cardiovascular: Regular rate and rhythm, no murmurs appreciated ?Pulmonary/Chest: Clear to auscultation bilaterally, no wheezes or rails ?Abdominal: Soft.  no distension.  no tenderness.  ?Musculoskeletal: Normal range of motion ?Neurological:  normal muscle tone. Coordination normal. No atrophy ?Skin: Skin warm and dry ?Psychiatric: normal affect, pleasant ? ?Recent Labs: ?No results found for requested labs within last 8760 hours.  ? ? ?Lipid Panel ?Lab Results  ?Component Value Date  ? CHOL 160 08/30/2020  ? HDL 61 08/30/2020  ? [place] 85 08/30/2020  ? TRIG 65 08/30/2020  ? ?  ? ?Wt Readings from Last 3 Encounters:  ?03/23/22 82.8 kg  ?11/28/21 82.6 kg  ?09/11/21 81.6 kg  ?  ? ? ?ASSESSMENT AND PLAN: ? ?Paroxysmal atrial fibrillation (HCC) - Plan: EKG 12-Lead ?CHADS VASC of 2,  ?Eliquis 5 mg twice daily ?Maintaining normal sinus rhythm ?No recent episodes of tachypalpitations ?Takes metoprolol and amiodarone as needed ? ?Mixed hyperlipidemia ?Tolerating Crestor ?Reports he is taking this every other day ?Risk stratification, we have ordered CT coronary calcium study at his request ? ?Essential hypertension -  ?Blood pressure  is well controlled on today's visit. No changes made to the medications. ? ?CKD (chronic kidney disease), stage II ?We have ordered labs on today's visit ? ?Other chest pain ?musculoskeletal pain ?May explain his chest pain symptom when he leans over to the left ? ? Total encounter time more than 30 minutes ? Greater than 50% was spent in counseling and coordination of care with the patient ? ? ? ?Orders Placed This Encounter  ?Procedures  ? EKG 12-Lead  ? ? ? ?Signed, ?Esmond Plants, M.D., Ph.D. ?03/23/2022  ?[place] ?[phone] ? ?

## 2022-03-23 ENCOUNTER — Encounter: Payer: Self-pay | Admitting: Cardiovascular Disease

## 2022-03-23 ENCOUNTER — Ambulatory Visit: Payer: Medicare Other | Admitting: Cardiovascular Disease

## 2022-03-23 VITALS — BP 122/76 | HR 85 | Ht 71.0 in | Wt 182.5 lb

## 2022-03-23 DIAGNOSIS — I480 Paroxysmal atrial fibrillation: Secondary | ICD-10-CM

## 2022-03-23 DIAGNOSIS — E782 Mixed hyperlipidemia: Secondary | ICD-10-CM

## 2022-03-23 DIAGNOSIS — I10 Essential (primary) hypertension: Secondary | ICD-10-CM

## 2022-03-23 DIAGNOSIS — N182 Chronic kidney disease, stage 2 (mild): Secondary | ICD-10-CM | POA: Diagnosis not present

## 2022-03-23 DIAGNOSIS — Z87891 Personal history of nicotine dependence: Secondary | ICD-10-CM

## 2022-03-23 MED ORDER — AMIODARONE HCL 200 MG PO TABS: 200.0000 mg | ORAL_TABLET | Freq: Every day | ORAL | 3 refills | Status: DC | PRN

## 2022-03-23 MED ORDER — METOPROLOL TARTRATE 25 MG PO TABS: 25.0000 mg | ORAL_TABLET | Freq: Two times a day (BID) | ORAL | 3 refills | Status: DC | PRN

## 2022-03-23 NOTE — Patient Instructions (Addendum)
Medication Instructions:  ?No changes ? ?We will change amiodarone  and metoprolol to PRN for atrial fib ? ? ?If you need a refill on your cardiac medications before your next appointment, please call your pharmacy.  ? ?Lab work: ? ?Today: Lipids, CMP ? ?Testing/Procedures: ? ?We will order CT coronary calcium score ? ?$99 at our [facility] in [place] ? ?Please call [place]ta [place] at [phone] to schedule ? ? ?[place] [address] ?[place], [place] [address]  ? ?Follow-Up: ?At [facility], you and your health needs are our priority.  As part of our continuing mission to provide you with exceptional heart care, we have created designated Provider Care Teams.  These Care Teams include your primary Cardiologist (physician) and Advanced Practice Providers (APPs -  Physician Assistants and Nurse Practitioners) who all work together to provide you with the care you need, when you need it. ? ?You will need a follow up appointment in 12 months ? ?Providers on your designated Care Team:   ?Murray Hodgkins, NP ?Christell Faith, PA-C ?Cadence Kathlen Mody, PA-C ? ?COVID-19 Vaccine Information can be found at: [email or web address] For questions related to vaccine distribution or appointments, please email [email or web address] or call [phone].  ? ?

## 2022-03-24 LAB — COMPREHENSIVE METABOLIC PANEL
ALT: 14 IU/L (ref 0–44)
AST: 21 IU/L (ref 0–40)
Albumin/Globulin Ratio: 1.8 (ref 1.2–2.2)
Albumin: 3.9 g/dL (ref 3.7–4.7)
Alkaline Phosphatase: 97 IU/L (ref 44–121)
BUN/Creatinine Ratio: 12 (ref 10–24)
BUN: 16 mg/dL (ref 8–27)
Bilirubin Total: 0.7 mg/dL (ref 0.0–1.2)
CO2: 24 mmol/L (ref 20–29)
Calcium: 9.4 mg/dL (ref 8.6–10.2)
Chloride: 111 mmol/L — ABNORMAL HIGH (ref 96–106)
Creatinine, Ser: 1.32 mg/dL — ABNORMAL HIGH (ref 0.76–1.27)
Globulin, Total: 2.2 g/dL (ref 1.5–4.5)
Glucose: 96 mg/dL (ref 70–99)
Potassium: 4.3 mmol/L (ref 3.5–5.2)
Sodium: 148 mmol/L — ABNORMAL HIGH (ref 134–144)
Total Protein: 6.1 g/dL (ref 6.0–8.5)
eGFR: 57 mL/min/{1.73_m2} — ABNORMAL LOW (ref >59)

## 2022-03-24 LAB — LIPID PANEL
Chol/HDL Ratio: 2.9 ratio (ref 0.0–5.0)
Cholesterol, Total: 170 mg/dL (ref 100–199)
HDL: 59 mg/dL (ref >39)
LDL Chol Calc (NIH): 98 mg/dL (ref 0–99)
Triglycerides: 67 mg/dL (ref 0–149)
VLDL Cholesterol Cal: 13 mg/dL (ref 5–40)

## 2022-03-26 ENCOUNTER — Other Ambulatory Visit: Payer: Self-pay | Admitting: Cardiovascular Disease

## 2022-03-26 DIAGNOSIS — I480 Paroxysmal atrial fibrillation: Secondary | ICD-10-CM

## 2022-03-31 NOTE — Progress Notes (Incomplete)
? ?03/31/22 ?4:28 PM  ? ?Andrew Benson ?[birth year] ?[id number] ? ?Referring provider:  ?Olin Hauser, DO ?[address] ?[place],  [place] [address] ?No chief complaint on file. ? ? ? ? ?HPI: ?Andrew Benson is a 76 y.o.male with a personal history of BPH with obstructive urinary symptoms, ED, and elevated PSA who returns today for routine annual follow-up. ? ?His PSA has been fluctuating in the 2-5 range over the past several years.  Prostatic volume 101 g based on estimated size on MRI.  MRI was completed on 01/26/2019.  There was evidence of moderate BPH with a significant median lobe but no other suspicious lesions.  ? ?His most recent PSA was 4.7 on 07/07/2021.  ? ?He is managed on sildenafil as needed for ED and Flomax for BPH.  ? ? ? ?Score:  ?1-7 Mild ?8-19 Moderate ?20-35 Severe ? ? ?PMH: ?Past Medical History:  ?Diagnosis Date  ? Asthma   ? Essential hypertension   ? Hyperlipidemia   ? PAF (paroxysmal atrial fibrillation) ([place])   ? a. CHA2DS2VASc = 2;  b. takes amio/eliquis/metoprolol prn for PAF.  ? TMJ locking   ? ? ?Surgical History: ?Past Surgical History:  ?Procedure Laterality Date  ? COLONOSCOPY WITH PROPOFOL N/A 12/29/2017  ? Procedure: COLONOSCOPY WITH PROPOFOL;  Surgeon: Manya Silvas, MD;  Location: [facility] ENDOSCOPY;  Service: Endoscopy;  Laterality: N/A;  ? COLONOSCOPY WITH PROPOFOL N/A 07/02/2021  ? Procedure: COLONOSCOPY WITH PROPOFOL;  Surgeon: Toledo, [name], MD;  Location: [facility] ENDOSCOPY;  Service: Gastroenterology;  Laterality: N/A;  ? FINGER SURGERY    ? left index finger  ? INGUINAL HERNIA REPAIR Right 2007  ? INGUINAL HERNIA REPAIR Left 2008  ? done x 2  ? KNEE SURGERY    ? left   ? ? ?Home Medications:  ?Allergies as of 04/01/2022   ? ?   Reactions  ? Penicillins   ? Rash ?Rash  ? ?  ? ?  ?Medication List  ?  ? ?  ? Accurate as of March 31, 2022  4:28 PM. If you have any questions, ask your nurse or doctor.  ?  ?  ? ?  ? ?amiodarone 200 MG tablet ?Commonly known as: PACERONE ?TAKE 1  TABLET (200 MG TOTAL) BY MOUTH DAILY AS NEEDED (TAKE FOR A FIB). ?  ?apixaban 5 MG Tabs tablet ?Commonly known as: Eliquis ?Take 1 tablet (5 mg total) by mouth 2 (two) times daily. NEED APPT w/ DR Rockey Situ FOR REFILLS ?  ?baclofen 10 MG tablet ?Commonly known as: LIORESAL ?Take 10 mg by mouth 3 (three) times daily. ?  ?lisinopril 2.5 MG tablet ?Commonly known as: ZESTRIL ?TAKE 1 TABLET BY MOUTH EVERY DAY ?  ?metoprolol tartrate 25 MG tablet ?Commonly known as: LOPRESSOR ?TAKE 1 TABLET (25 MG TOTAL) BY MOUTH 2 (TWO) TIMES DAILY AS NEEDED (TAKE FOR A FIB). ?  ?multivitamin capsule ?Take 1 capsule by mouth daily. ?  ?PROSTATE HEALTH PO ?Take by mouth. ?  ?rosuvastatin 20 MG tablet ?Commonly known as: CRESTOR ?TAKE 1 TABLET BY MOUTH EVERY DAY ?  ?sildenafil 20 MG tablet ?Commonly known as: REVATIO ?3-5 TABLETS DAILY AS NEEDED ?  ?tamsulosin 0.4 MG Caps capsule ?Commonly known as: FLOMAX ?TAKE 1 CAPSULE BY MOUTH EVERY DAY ?  ? ?  ? ? ?Allergies:  ?Allergies  ?Allergen Reactions  ? Penicillins   ?  Rash ?Rash ?  ? ? ?Family History: ?Family History  ?Problem Relation Age of Onset  ?  Hyperlipidemia Mother   ? Hypertension Mother   ? Heart disease Mother   ? Heart disease Sister   ?     valve repair   ? Diabetes Brother   ? Hypertension Brother   ? ? ?Social History:  reports that he quit smoking about 29 years ago. His smoking use included cigarettes. He has a 20.00 pack-year smoking history. He has never used smokeless tobacco. He reports current alcohol use. He reports that he does not use drugs. ? ? ?Physical Exam: ?There were no vitals taken for this visit.  ?Constitutional:  Alert and oriented, No acute distress. ?HEENT: [place] AT, moist mucus membranes.  Trachea midline, no masses. ?Cardiovascular: No clubbing, cyanosis, or edema. ?Respiratory: Normal respiratory effort, no increased work of breathing. ?Rectal: Normal sphincter tone,  ***  CC prostate, smooth no nodules ?Skin: No rashes, bruises or suspicious  lesions. ?Neurologic: Grossly intact, no focal deficits, moving all 4 extremities. ?Psychiatric: Normal mood and affect. ? ?Laboratory Data: ? ?Lab Results  ?Component Value Date  ? CREATININE 1.32 (H) 03/23/2022  ? ?Lab Results  ?Component Value Date  ? HGBA1C 5.3 08/30/2020  ? ? ?Urinalysis ? ? ?Pertinent Imaging: ?No results found for any visits on 04/01/22. ? ? ? ?Assessment & Plan:   ? ? ?No follow-ups on file. ? ?I,Kailey Littlejohn,acting as a scribe for Hollice Espy, MD.,have documented all relevant documentation on the behalf of Hollice Espy, MD,as directed by  Hollice Espy, MD while in the presence of Hollice Espy, MD. ? ? ?[facility] ?[address], [address] ?[place],  [address] ?[phone] ? ?

## 2022-04-01 ENCOUNTER — Ambulatory Visit: Payer: Medicare Other | Admitting: Urology

## 2022-04-01 ENCOUNTER — Ambulatory Visit: Payer: Self-pay | Admitting: Urology

## 2022-04-07 ENCOUNTER — Other Ambulatory Visit: Payer: Self-pay | Admitting: Cardiovascular Disease

## 2022-04-07 ENCOUNTER — Ambulatory Visit: Admission: RE | Admit: 2022-04-07 | Payer: Medicare Other | Source: Ambulatory Visit

## 2022-04-07 DIAGNOSIS — I480 Paroxysmal atrial fibrillation: Secondary | ICD-10-CM

## 2022-04-07 NOTE — Telephone Encounter (Signed)
Prescription refill request for Eliquis received. ?Indication: Afib  ?Last office visit:03/23/22 Rockey Situ)  ?Scr: 1.32 (03/23/22) ?Age: 76 ?Weight: 82.8kg ? ?Appropriate dose and refill sent to requested pharmacy.  ?

## 2022-04-07 NOTE — Telephone Encounter (Signed)
Refill Request.  

## 2022-04-10 ENCOUNTER — Other Ambulatory Visit: Payer: Self-pay | Admitting: Cardiovascular Disease

## 2022-04-10 DIAGNOSIS — I10 Essential (primary) hypertension: Secondary | ICD-10-CM

## 2022-04-14 NOTE — Progress Notes (Signed)
? ?04/15/2022 ?11:06 AM  ? ?Andrew Benson ?Apr 18, 1946 ?[id number] ? ?Referring provider:  ?Olin Hauser, DO ?[address] ?[place],  [place] [address] ?Chief Complaint  ?Patient presents with  ? Benign Prostatic Hypertrophy  ? ? ? ? ?HPI: ?Andrew Benson is a 76 y.o.malewith a personal history of BPH with obstructive urinary symptoms and elevated PSA who returns today for routine annual follow-up. ? ?His PSA has been fluctuating in the 2-5 range over the past several years. Prostatic volume 101 g based on estimated size on MRI.  MRI was completed on 01/26/2019.  There was evidence of moderate BPH with a significant median lobe but no other suspicious lesions.  His most recent PSA was 4.7 on 07/07/2021.  PSA today has not been drawn/pending. ? ?He reports today that he is able to obtain an erection on sildenafil but he is unable to maintain the erection. He is taking 4 at a time.  He is concerned today about spontaneity.  He will often take it about 30 minutes prior to sexual activity which may or may not be sufficient to maintain his erection.  He wonders if there is any other options. ? ?He reports that he was previously seen by Dr Kirke Corin he had lichen sclerosis of penis and he was placed on clobetasol  of thinning skin around penis.  He reports that on the dorsal aspect of his coronal margin, it often becomes red and flaky irritated which resolves with this ointment.  He is requesting a refill of this today. ? ?PSA trend:  ? Prostate Specific Ag, Serum  ?Latest Ref Rng 0.0 - 4.0 ng/mL  ?10/13/2016 4.0  ?11/05/2017 3.80  ?02/202019 3.88  ?05/10/2018 4.75 (H)   ?08/12/2018 4.34 (H)   ?01/02/2020 5.7 (H)   ?07/02/2020 3.60   ?07/07/2021 4.7 (H)   ?  ?(H) High ? ? IPSS   ? ? [place] Name 04/15/22 0900  ?  ?  ?  ? International Prostate Symptom Score  ? How often have you had the sensation of not emptying your bladder? Less than half the time    ? How often have you had to urinate less than every two hours? Not at All    ?  How often have you found you stopped and started again several times when you urinated? Less than 1 in 5 times    ? How often have you found it difficult to postpone urination? Less than 1 in 5 times    ? How often have you had a weak urinary stream? Not at All    ? How often have you had to strain to start urination? Not at All    ? How many times did you typically get up at night to urinate? 2 Times    ? Total IPSS Score 6    ?  ? Quality of Life due to urinary symptoms  ? If you were to spend the rest of your life with your urinary condition just the way it is now how would you feel about that? Pleased    ? ?  ?  ? ?  ? ? ?Score:  ?1-7 Mild ?8-19 Moderate ?20-35 Severe ? ?PMH: ?Past Medical History:  ?Diagnosis Date  ? Asthma   ? Essential hypertension   ? Hyperlipidemia   ? PAF (paroxysmal atrial fibrillation) ([place])   ? a. CHA2DS2VASc = 2;  b. takes amio/eliquis/metoprolol prn for PAF.  ? TMJ locking   ? ? ?Surgical History: ?Past  Surgical History:  ?Procedure Laterality Date  ? COLONOSCOPY WITH PROPOFOL N/A 12/29/2017  ? Procedure: COLONOSCOPY WITH PROPOFOL;  Surgeon: Manya Silvas, MD;  Location: [facility] ENDOSCOPY;  Service: Endoscopy;  Laterality: N/A;  ? COLONOSCOPY WITH PROPOFOL N/A 07/02/2021  ? Procedure: COLONOSCOPY WITH PROPOFOL;  Surgeon: Toledo, [name], MD;  Location: [facility] ENDOSCOPY;  Service: Gastroenterology;  Laterality: N/A;  ? FINGER SURGERY    ? left index finger  ? INGUINAL HERNIA REPAIR Right 2007  ? INGUINAL HERNIA REPAIR Left 2008  ? done x 2  ? KNEE SURGERY    ? left   ? ? ?Home Medications:  ?Allergies as of 04/15/2022   ? ?   Reactions  ? Penicillins   ? Rash ?Rash  ? ?  ? ?  ?Medication List  ?  ? ?  ? Accurate as of April 15, 2022 11:06 AM. If you have any questions, ask your nurse or doctor.  ?  ?  ? ?  ? ?STOP taking these medications   ? ?sildenafil 20 MG tablet ?Commonly known as: REVATIO ?Stopped by: Hollice Espy, MD ?  ? ?  ? ?TAKE these medications   ? ?amiodarone 200 MG  tablet ?Commonly known as: PACERONE ?TAKE 1 TABLET (200 MG TOTAL) BY MOUTH DAILY AS NEEDED (TAKE FOR A FIB). ?  ?baclofen 10 MG tablet ?Commonly known as: LIORESAL ?Take 10 mg by mouth 3 (three) times daily. ?  ?Eliquis 5 MG Tabs tablet ?Generic drug: apixaban ?TAKE 1 TABLET (5 MG TOTAL) BY MOUTH 2 (TWO) TIMES DAILY. NEED APPT W/ DR Rockey Situ FOR REFILLS ?  ?lisinopril 2.5 MG tablet ?Commonly known as: ZESTRIL ?TAKE 1 TABLET BY MOUTH EVERY DAY ?  ?metoprolol tartrate 25 MG tablet ?Commonly known as: LOPRESSOR ?TAKE 1 TABLET (25 MG TOTAL) BY MOUTH 2 (TWO) TIMES DAILY AS NEEDED (TAKE FOR A FIB). ?  ?multivitamin capsule ?Take 1 capsule by mouth daily. ?  ?nystatin-triamcinolone ointment ?Commonly known as: MYCOLOG ?Apply 1 application. topically 2 (two) times daily. ?Started by: Hollice Espy, MD ?  ?PROSTATE HEALTH PO ?Take by mouth. ?  ?rosuvastatin 20 MG tablet ?Commonly known as: CRESTOR ?TAKE 1 TABLET BY MOUTH EVERY DAY ?  ?tadalafil 20 MG tablet ?Commonly known as: CIALIS ?Take 1 tablet (20 mg total) by mouth daily as needed for erectile dysfunction. ?Started by: Hollice Espy, MD ?  ?tamsulosin 0.4 MG Caps capsule ?Commonly known as: FLOMAX ?TAKE 1 CAPSULE BY MOUTH EVERY DAY ?  ? ?  ? ? ?Allergies:  ?Allergies  ?Allergen Reactions  ? Penicillins   ?  Rash ?Rash ?  ? ? ?Family History: ?Family History  ?Problem Relation Age of Onset  ? Hyperlipidemia Mother   ? Hypertension Mother   ? Heart disease Mother   ? Heart disease Sister   ?     valve repair   ? Diabetes Brother   ? Hypertension Brother   ? ? ?Social History:  reports that he quit smoking about 29 years ago. His smoking use included cigarettes. He has a 20.00 pack-year smoking history. He has never used smokeless tobacco. He reports current alcohol use. He reports that he does not use drugs. ? ? ?Physical Exam: ?BP 134/78   Pulse 89   Ht '5\' 11"'$  (1.803 m)   Wt 182 lb (82.6 kg)   BMI 25.38 kg/m?   ?Constitutional:  Alert and oriented, No acute  distress. ?HEENT: [place] AT, moist mucus membranes.  Trachea midline, no masses. ?Cardiovascular:  No clubbing, cyanosis, or edema. ?Respiratory: Normal respiratory effort, no increased work of breathing. ?GU: No erythema incidental penile vitiligo  Patient with circumcised phallus. Foreskin easily retracted  Urethral meatus is patent.  No penile discharge. No penile lesions or rashes.  ?Rectal: Normal sphincter tone,  massive prostate prostate, smooth no nodules ?Skin: No rashes, bruises or suspicious lesions. ?Neurologic: Grossly intact, no focal deficits, moving all 4 extremities. ?Psychiatric: Normal mood and affect. ? ?Laboratory Data: ?Lab Results  ?Component Value Date  ? CREATININE 1.32 (H) 03/23/2022  ? ?Lab Results  ?Component Value Date  ? HGBA1C 5.3 08/30/2020  ? ? ?Pertinent Imaging: ?Results for orders placed or performed in visit on 04/15/22  ?Bladder Scan (Post Void Residual) in office  ?Result Value Ref Range  ? Scan Result 15   ? ? ?Assessment & Plan:   ? ?1. Benign localized hyperplasia of prostate with urinary obstruction ?- His symptoms are stable and well-controlled on Flomax only, doing well on this prescription. Asymptomatic  ?- He is emptying adequately with a PVR of 15 ml today.  ?- BLADDER SCAN AMB NON-IMAGING ? ?2. History of elevated PSA ?- PSA; pending. Will call him with results. ?- DRE benign  ? ?3. Combined arterial insufficiency and corporo-venous occlusive erectile dysfunction ?- He is able to gain an erection on sildenafil but he is notable to maintain this erection. Discussed him increasing dose to 100 mg on-demand-dosing versus switching medication. He is interested in Cialis 20 mg prn today. He will call us if this works or does not work.  ?- Cialis; prescribed  ? ?3. Chronic penile irritation  ?- He has used clobetasol in the past . Based on history this is probably yeast related  ?- Prescribed Mycolog today to use as needed.  ? ?F/u 1 year IPSS/ PVR / PSA/ DRE ? ?Conley Rolls as a Education administrator for Hollice Espy, MD.,have documented all relevant documentation on the behalf of Hollice Espy, MD,as directed by  Hollice Espy, MD while in the presence of Hollice Espy, MD. ? ?I have reviewed

## 2022-04-15 ENCOUNTER — Ambulatory Visit: Payer: Medicare Other | Admitting: Urology

## 2022-04-15 ENCOUNTER — Encounter: Payer: Self-pay | Admitting: Urology

## 2022-04-15 VITALS — BP 134/78 | HR 89 | Ht 71.0 in | Wt 182.0 lb

## 2022-04-15 DIAGNOSIS — N4889 Other specified disorders of penis: Secondary | ICD-10-CM

## 2022-04-15 DIAGNOSIS — N401 Enlarged prostate with lower urinary tract symptoms: Secondary | ICD-10-CM

## 2022-04-15 DIAGNOSIS — N138 Other obstructive and reflux uropathy: Secondary | ICD-10-CM

## 2022-04-15 DIAGNOSIS — N5203 Combined arterial insufficiency and corporo-venous occlusive erectile dysfunction: Secondary | ICD-10-CM

## 2022-04-15 LAB — BLADDER SCAN AMB NON-IMAGING: Scan Result: 15

## 2022-04-15 MED ORDER — TADALAFIL 20 MG PO TABS: 20.0000 mg | ORAL_TABLET | Freq: Every day | ORAL | 0 refills | Status: DC | PRN

## 2022-04-15 MED ORDER — NYSTATIN-TRIAMCINOLONE 100000-0.1 UNIT/GM-% EX OINT: 1.0000 "application " | TOPICAL_OINTMENT | Freq: Two times a day (BID) | CUTANEOUS | 0 refills | Status: DC

## 2022-04-16 ENCOUNTER — Telehealth: Payer: Self-pay | Admitting: *Deleted

## 2022-04-16 LAB — PSA: Prostate Specific Ag, Serum: 5.3 ng/mL — ABNORMAL HIGH (ref 0.0–4.0)

## 2022-04-16 NOTE — Telephone Encounter (Signed)
Notified patient as instructed, patient pleased. Discussed follow-up appointments, patient agrees  

## 2022-04-16 NOTE — Telephone Encounter (Addendum)
Patient left Vm with details, asked to return call with any questions.  ? ?----- Message from Hollice Espy, MD sent at 04/16/2022  7:48 AM EDT ----- ?PSA is up a little from last year but stable from 2 years ago.  We will continue to monitor annually. ? ?Hollice Espy, MD ? ?

## 2022-05-28 ENCOUNTER — Ambulatory Visit (INDEPENDENT_AMBULATORY_CARE_PROVIDER_SITE_OTHER): Payer: Medicare Other | Admitting: Family Medicine

## 2022-05-28 ENCOUNTER — Encounter: Payer: Self-pay | Admitting: Family Medicine

## 2022-05-28 VITALS — BP 118/64 | HR 70 | Ht 71.0 in | Wt 182.4 lb

## 2022-05-28 DIAGNOSIS — H6983 Other specified disorders of Eustachian tube, bilateral: Secondary | ICD-10-CM

## 2022-05-28 MED ORDER — FLUTICASONE PROPIONATE 50 MCG/ACT NA SUSP: 2.0000 | Freq: Every day | NASAL | 3 refills | Status: DC

## 2022-05-28 NOTE — Progress Notes (Signed)
Subjective:    Patient ID: Andrew Benson, male    DOB: 27-May-1946, 76 y.o.   MRN: [id number]  Andrew Benson is a 76 y.o. male presenting on 05/28/2022 for Ear Pain   HPI  Bilateral Ear Pain Eustachian Tube Dysfunction Reports symptoms started 3-4 weeks ago, he has had issues with sinus congestion pressure, then that improved and he had initial pain with both ears, seems episodic, now he has no pain but he has issue with ringing tone that is fairly steady and difficulty telling where a sound comes from      11/28/2021    8:27 AM 11/28/2021    8:23 AM 11/05/2020   10:01 AM  Depression screen PHQ 2/9  Decreased Interest 0 0 0  Down, Depressed, Hopeless 0 0 0  PHQ - 2 Score 0 0 0    Social History   Tobacco Use   Smoking status: Former    Packs/day: 1.00    Years: 20.00    Total pack years: 20.00    Types: Cigarettes    Quit date: 02/01/1993    Years since quitting: 29.3   Smokeless tobacco: Never  Vaping Use   Vaping Use: Never used  Substance Use Topics   Alcohol use: Yes    Alcohol/week: 0.0 standard drinks of alcohol    Comment: social drinker.   Drug use: No    Review of Systems Per HPI unless specifically indicated above     Objective:    BP 118/64   Pulse 70   Ht '5\' 11"'$  (1.803 m)   Wt 182 lb 6.4 oz (82.7 kg)   SpO2 99%   BMI 25.44 kg/m   Wt Readings from Last 3 Encounters:  05/28/22 182 lb 6.4 oz (82.7 kg)  04/15/22 182 lb (82.6 kg)  03/23/22 182 lb 8 oz (82.8 kg)    Physical Exam Vitals and nursing note reviewed.  Constitutional:      General: He is not in acute distress.    Appearance: Normal appearance. He is well-developed. He is not diaphoretic.     Comments: Well-appearing, comfortable, cooperative  HENT:     Head: Normocephalic and atraumatic.     Right Ear: Ear canal and external ear normal. There is no impacted cerumen.     Left Ear: Ear canal and external ear normal. There is no impacted cerumen.     Ears:     Comments: TMs  bilateral effusion bulging clear, R >L Eyes:     General:        Right eye: No discharge.        Left eye: No discharge.     Conjunctiva/sclera: Conjunctivae normal.  Cardiovascular:     Rate and Rhythm: Normal rate.  Pulmonary:     Effort: Pulmonary effort is normal.  Skin:    General: Skin is warm and dry.     Findings: No erythema or rash.  Neurological:     Mental Status: He is alert and oriented to person, place, and time.  Psychiatric:        Mood and Affect: Mood normal.        Behavior: Behavior normal.        Thought Content: Thought content normal.     Comments: Well groomed, good eye contact, normal speech and thoughts    Results for orders placed or performed in visit on 04/15/22  PSA  Result Value Ref Range   Prostate Specific Ag, Serum 5.3 (H) 0.0 - 4.0  ng/mL  Bladder Scan (Post Void Residual) in office  Result Value Ref Range   Scan Result 15       Assessment & Plan:   Problem List Items Addressed This Visit   None Visit Diagnoses     Eustachian tube dysfunction, bilateral    -  Primary   Relevant Medications   fluticasone (FLONASE) 50 MCG/ACT nasal spray       You have some Eustachian Tube Dysfunction, this problem is usually caused by some deeper sinus swelling and pressure, causing difficulty of eustachian tubes to clear fluid from behind ear drum. You can have ear pain, pressure, fullness, loss of hearing. Often related to sinus symptoms and sometimes with sinusitis or infection or allergy symptoms.  Treatment: - Start using nasal steroid spray, Flonase 2 sprays in each nostril every day for at least 4-6 weeks, and maybe longer Can also use Simply Saline Nasal Saline to rinse sinuses  - Start OTC allergy medicine (Claritin, Zyrtec, or Allegra - or generics) once daily - For significant ear/sinus pressure, try OTC decongestant such as Sudafed behind the counter or Phenylephrine decongestant. - AVOID Afrin nasal spray, if you use this do not use  more than 3 days or can make sinus swelling worse  Meds ordered this encounter  Medications   fluticasone (FLONASE) 50 MCG/ACT nasal spray    Sig: Place 2 sprays into both nostrils daily. Use for 4-6 weeks then stop and use seasonally or as needed.    Dispense:  16 g    Refill:  3      Follow up plan: Return if symptoms worsen or fail to improve.   [name], [facility] 05/28/2022, 11:55 AM

## 2022-05-28 NOTE — Patient Instructions (Addendum)
Thank you for coming to the office today.  You have some Eustachian Tube Dysfunction, this problem is usually caused by some deeper sinus swelling and pressure, causing difficulty of eustachian tubes to clear fluid from behind ear drum. You can have ear pain, pressure, fullness, loss of hearing. Often related to sinus symptoms and sometimes with sinusitis or infection or allergy symptoms.  Treatment: - Start using nasal steroid spray, Flonase 2 sprays in each nostril every day for at least 4-6 weeks, and maybe longer Can also use Simply Saline Nasal Saline to rinse sinuses  - Start OTC allergy medicine (Claritin, Zyrtec, or Allegra - or generics) once daily - For significant ear/sinus pressure, try OTC decongestant such as Sudafed behind the counter or Phenylephrine decongestant. - AVOID Afrin nasal spray, if you use this do not use more than 3 days or can make sinus swelling worse   Please schedule a Follow-up Appointment to: Return if symptoms worsen or fail to improve.  If you have any other questions or concerns, please feel free to call the office or send a message through [facility]. You may also schedule an earlier appointment if necessary.  Additionally, you may be receiving a survey about your experience at our office within a few days to 1 week by e-mail or mail. We value your feedback.  [name], DO [place]

## 2022-06-01 ENCOUNTER — Ambulatory Visit: Payer: Medicare Other | Admitting: Family Medicine

## 2022-06-24 ENCOUNTER — Other Ambulatory Visit: Payer: Self-pay | Admitting: Family Medicine

## 2022-06-24 DIAGNOSIS — R339 Retention of urine, unspecified: Secondary | ICD-10-CM

## 2022-06-24 DIAGNOSIS — N401 Enlarged prostate with lower urinary tract symptoms: Secondary | ICD-10-CM

## 2022-06-24 NOTE — Telephone Encounter (Signed)
Requested medication (s) are due for refill today: yes   Requested medication (s) are on the active medication list: yes  Last refill:  12/25/21 #90 1 refill  Future visit scheduled: no  Notes to clinic:  protocol failed last lab 04/15/22. Do you want to refill Rx?     Requested Prescriptions  Pending Prescriptions Disp Refills   tamsulosin (FLOMAX) 0.4 MG CAPS capsule [Pharmacy Med Name: TAMSULOSIN HCL 0.4 MG CAPSULE] 90 capsule 1    Sig: TAKE 1 CAPSULE BY MOUTH EVERY DAY     Urology: Alpha-Adrenergic Blocker Failed - 06/24/2022  1:45 AM      Failed - PSA in normal range and within 360 days    PSA  Date Value Ref Range Status  10/13/2016 4.0 <=4.0 ng/mL Final    Comment:      The total PSA value from this assay system is standardized against the WHO standard. The test result will be approximately 20% lower when compared to the equimolar-standardized total PSA (Beckman Coulter). Comparison of serial PSA results should be interpreted with this fact in mind.   This test was performed using the Siemens chemiluminescent method. Values obtained from different assay methods cannot be used interchangeably. PSA levels, regardless of value, should not be interpreted as absolute evidence of the presence or absence of disease.   Effective July 27, 2016, Total PSA is being tested on the Siemens Centaur XP using chemiluminescence methodology. Re-baseline testing will be available until October 26, 2016 at no charge. If you have a patient that may require re-baselining, please order [id number] in addition to [id number].    Prostate Specific Ag, Serum  Date Value Ref Range Status  04/15/2022 5.3 (H) 0.0 - 4.0 ng/mL Final    Comment:    Roche ECLIA methodology. According to the American Urological Association, Serum PSA should decrease and remain at undetectable levels after radical prostatectomy. The AUA defines biochemical recurrence as an initial PSA value 0.2 ng/mL or greater followed by  a subsequent confirmatory PSA value 0.2 ng/mL or greater. Values obtained with different assay methods or kits cannot be used interchangeably. Results cannot be interpreted as absolute evidence of the presence or absence of malignant disease.          Passed - Last BP in normal range    BP Readings from Last 1 Encounters:  05/28/22 118/64         Passed - Valid encounter within last 12 months    Recent Outpatient Visits           3 weeks ago Eustachian tube dysfunction, bilateral   [place], DO   9 months ago Needs flu shot   [place], DO   1 year ago Acute right-sided low back pain without sciatica   [place], DO   1 year ago Chronic left shoulder pain   [place], DO   1 year ago Annual physical exam   [facility] Olin Hauser, DO

## 2022-07-09 ENCOUNTER — Other Ambulatory Visit: Payer: Self-pay

## 2022-07-15 ENCOUNTER — Ambulatory Visit: Payer: Self-pay | Admitting: Urology

## 2022-08-25 ENCOUNTER — Ambulatory Visit: Payer: Self-pay

## 2022-08-25 NOTE — Telephone Encounter (Signed)
Chief Complaint: Trigger finger pain Symptoms: Finger is manually pulled back in upright position, pain 5-6/10 Frequency: Ongoing x 2 weeks, happens often Pertinent Negatives: Patient denies other symptoms Disposition: '[]'$ ED /'[]'$ Urgent Care (no appt availability in office) / '[x]'$ Appointment(In office/virtual)/ '[]'$  [facility]/ '[]'$ Home Care/ '[]'$ Refused Recommended Disposition /'[]'$ [facility]/ '[]'$  Follow-up with PCP Additional Notes: N/A    Summary: Tigger finger Advice   Pt is calling to report Left hand Middle finger trigger finger for a couple weeks. No available appts until Friday 08/28/22 with Erin Mecum. Pt would like to know advice or could be seen sooner. Please advise      Reason for Disposition  [1] MODERATE pain (e.g., interferes with normal activities) AND [2] present > 3 days  Answer Assessment - Initial Assessment Questions 1. ONSET: "When did the pain start?"      2 weeks 2. LOCATION and RADIATION: "Where is the pain located?"  (e.g., fingertip, around nail, joint, entire  finger)      Left middle finger 3. SEVERITY: "How bad is the pain?" "What does it keep you from doing?"   (Scale 1-10; or mild, moderate, severe)  - MILD (1-3): doesn't interfere with normal activities.   - MODERATE (4-7): interferes with normal activities or awakens from sleep.  - SEVERE (8-10): excruciating pain, unable to hold a glass of water or bend finger even a little.     5-6 4. APPEARANCE: "What does the finger look like?" (e.g., redness, swelling, bruising, pallor)     Normal 5. WORK OR EXERCISE: "Has there been any recent work or exercise that involved this part (i.e., fingers or hand) of the body?"     Carry boxes, so unsure 6. CAUSE: "What do you think is causing the pain?"     Unsure 7. AGGRAVATING FACTORS: "What makes the pain worse?" (e.g., using computer)     Bending the finger and trying to move it back into correct position 8. OTHER SYMPTOMS: "Do you have any other  symptoms?" (e.g., fever, neck pain, numbness)     No 9. PREGNANCY: "Is there any chance you are pregnant?" "When was your last menstrual period?"     N/A  Protocols used: Finger Pain-A-AH

## 2022-08-26 ENCOUNTER — Encounter: Payer: Self-pay | Admitting: Internal Medicine

## 2022-08-26 ENCOUNTER — Ambulatory Visit (INDEPENDENT_AMBULATORY_CARE_PROVIDER_SITE_OTHER): Payer: Medicare Other | Admitting: Internal Medicine

## 2022-08-26 VITALS — BP 106/68 | HR 96 | Temp 97.3°F | Wt 180.0 lb

## 2022-08-26 DIAGNOSIS — I480 Paroxysmal atrial fibrillation: Secondary | ICD-10-CM | POA: Diagnosis not present

## 2022-08-26 DIAGNOSIS — M65332 Trigger finger, left middle finger: Secondary | ICD-10-CM

## 2022-08-26 NOTE — Progress Notes (Signed)
Subjective:    Patient ID: Andrew Benson, male    DOB: 03-Aug-1946, 76 y.o.   MRN: [id number]  HPI  Patient presents to clinic today with complaint of left middle finger pain.  He reports this started 2 weeks ago.  He describes the pain as tight/locking.  He thinks he may have a trigger finger. He has not tried anything OTC for this.  Also, he is wondering if he can come off Eliquis and take Aspirin.  He has a history of paroxysmal A-fib, managed with Amiodarone and Metoprolol.  He follows with Dr. Philmore Pali.  Review of Systems     Past Medical History:  Diagnosis Date   Asthma    Essential hypertension    Hyperlipidemia    PAF (paroxysmal atrial fibrillation) (HCC)    a. CHA2DS2VASc = 2;  b. takes amio/eliquis/metoprolol prn for PAF.   TMJ locking     Current Outpatient Medications  Medication Sig Dispense Refill   amiodarone (PACERONE) 200 MG tablet TAKE 1 TABLET (200 MG TOTAL) BY MOUTH DAILY AS NEEDED (TAKE FOR A FIB). 90 tablet 3   apixaban (ELIQUIS) 5 MG TABS tablet TAKE 1 TABLET (5 MG TOTAL) BY MOUTH 2 (TWO) TIMES DAILY. NEED APPT W/ DR Rockey Situ FOR REFILLS 60 tablet 5   baclofen (LIORESAL) 10 MG tablet Take 10 mg by mouth 3 (three) times daily.     fluticasone (FLONASE) 50 MCG/ACT nasal spray Place 2 sprays into both nostrils daily. Use for 4-6 weeks then stop and use seasonally or as needed. 16 g 3   lisinopril (ZESTRIL) 2.5 MG tablet TAKE 1 TABLET BY MOUTH EVERY DAY 30 tablet 10   metoprolol tartrate (LOPRESSOR) 25 MG tablet TAKE 1 TABLET (25 MG TOTAL) BY MOUTH 2 (TWO) TIMES DAILY AS NEEDED (TAKE FOR A FIB). 180 tablet 3   Misc Natural Products (PROSTATE HEALTH PO) Take by mouth.     Multiple Vitamin (MULTIVITAMIN) capsule Take 1 capsule by mouth daily.     nystatin-triamcinolone ointment (MYCOLOG) Apply 1 application. topically 2 (two) times daily. 30 g 0   rosuvastatin (CRESTOR) 20 MG tablet TAKE 1 TABLET BY MOUTH EVERY DAY 90 tablet 3   tadalafil (CIALIS) 20 MG tablet Take  1 tablet (20 mg total) by mouth daily as needed for erectile dysfunction. 30 tablet 0   tamsulosin (FLOMAX) 0.4 MG CAPS capsule TAKE 1 CAPSULE BY MOUTH EVERY DAY 90 capsule 3   No current facility-administered medications for this visit.    Allergies  Allergen Reactions   Penicillins     Rash Rash     Family History  Problem Relation Age of Onset   Hyperlipidemia Mother    Hypertension Mother    Heart disease Mother    Heart disease Sister        valve repair    Diabetes Brother    Hypertension Brother     Social History   Socioeconomic History   Marital status: Married    Spouse name: Not on file   Number of children: Not on file   Years of education: Not on file   Highest education level: Associate degree: academic program  Occupational History   Not on file  Tobacco Use   Smoking status: Former    Packs/day: 1.00    Years: 20.00    Total pack years: 20.00    Types: Cigarettes    Quit date: 02/01/1993    Years since quitting: 29.5   Smokeless tobacco: Never  Vaping  Use   Vaping Use: Never used  Substance and Sexual Activity   Alcohol use: Yes    Alcohol/week: 0.0 standard drinks of alcohol    Comment: social drinker.   Drug use: No   Sexual activity: Not on file  Other Topics Concern   Not on file  Social History Narrative   Participates in church activities, and food bank 3 days a week   Gym 2 times weekly- treadmill    Social Determinants of Health   Financial Resource Strain: Low Risk  (11/28/2021)   Overall Financial Resource Strain (CARDIA)    Difficulty of Paying Living Expenses: Not hard at all  Food Insecurity: No Food Insecurity (11/28/2021)   Hunger Vital Sign    Worried About Running Out of Food in the Last Year: Never true    Ran Out of Food in the Last Year: Never true  Transportation Needs: No Transportation Needs (11/28/2021)   PRAPARE - Hydrologist (Medical): No    Lack of Transportation (Non-Medical): No   Physical Activity: Sufficiently Active (11/28/2021)   Exercise Vital Sign    Days of Exercise per Week: 5 days    Minutes of Exercise per Session: 60 min  Stress: No Stress Concern Present (11/28/2021)   [place]    Feeling of Stress : Not at all  Social Connections: Moderately Integrated (11/28/2021)   Social Connection and Isolation Panel [NHANES]    Frequency of Communication with Friends and Family: Twice a week    Frequency of Social Gatherings with Friends and Family: Twice a week    Attends Religious Services: More than 4 times per year    Active Member of [employer] or Organizations: No    Attends Archivist Meetings: Never    Marital Status: Married  Human resources officer Violence: Not At Risk (11/28/2021)   Humiliation, Afraid, Rape, and Kick questionnaire    Fear of Current or Ex-Partner: No    Emotionally Abused: No    Physically Abused: No    Sexually Abused: No     Constitutional: Denies fever, malaise, fatigue, headache or abrupt weight changes.  Respiratory: Denies difficulty breathing, shortness of breath, cough or sputum production.   Cardiovascular: Denies chest pain, chest tightness, palpitations or swelling in the hands or feet.  Musculoskeletal: Patient reports left middle finger pain and decreased range of motion.  Denies difficulty with gait, muscle pain or joint swelling.  Skin: Denies redness, rashes, lesions or ulcercations.  Neurological: Denies numbness, tingling or weakness of his left upper extremity.    No other specific complaints in a complete review of systems (except as listed in HPI above).  Objective:   Physical Exam  BP 106/68 (BP Location: Right Arm, Patient Position: Sitting, Cuff Size: Normal)   Pulse 96   Temp (!) 97.3 F (36.3 C) (Temporal)   Wt 180 lb (81.6 kg)   SpO2 99%   BMI 25.10 kg/m   Wt Readings from Last 3 Encounters:  05/28/22 182 lb 6.4 oz (82.7 kg)   04/15/22 182 lb (82.6 kg)  03/23/22 182 lb 8 oz (82.8 kg)    General: Appears his stated age, well developed, well nourished in NAD. Cardiovascular: Normal rate and rhythm.  Radial pulse 2+ on left. Pulmonary/Chest: Normal effort. Musculoskeletal: Joint enlargement noted of the PIP of the left middle finger.  Locking of the PIP noted with extension of the left middle finger.  No tendon  fibrosis noted of the left palm. Neurological: Alert and oriented.   BMET    Component Value Date/Time   NA 148 (H) 03/23/2022 1000   NA 140 10/26/2013 1132   K 4.3 03/23/2022 1000   K 4.1 10/26/2013 1132   CL 111 (H) 03/23/2022 1000   CL 111 (H) 10/26/2013 1132   CO2 24 03/23/2022 1000   CO2 28 10/26/2013 1132   GLUCOSE 96 03/23/2022 1000   GLUCOSE 85 08/30/2020 0837   GLUCOSE 100 (H) 10/26/2013 1132   BUN 16 03/23/2022 1000   BUN 15 10/26/2013 1132   CREATININE 1.32 (H) 03/23/2022 1000   CREATININE 1.33 (H) 08/30/2020 0837   CALCIUM 9.4 03/23/2022 1000   CALCIUM 8.6 10/26/2013 1132   GFRNONAA 53 (L) 08/30/2020 0837   GFRAA 61 08/30/2020 0837    Lipid Panel     Component Value Date/Time   CHOL 170 03/23/2022 1000   TRIG 67 03/23/2022 1000   HDL 59 03/23/2022 1000   CHOLHDL 2.9 03/23/2022 1000   CHOLHDL 2.6 08/30/2020 0837   VLDL 18 10/13/2016 0827   LDLCALC 98 03/23/2022 1000   LDLCALC 85 08/30/2020 0837    CBC    Component Value Date/Time   WBC 4.7 08/30/2020 0837   RBC 4.98 08/30/2020 0837   HGB 15.3 08/30/2020 0837   HGB 14.8 05/01/2016 0846   HCT 45.7 08/30/2020 0837   HCT 41.2 05/01/2016 0846   PLT 135 (L) 08/30/2020 0837   PLT 129 (L) 05/01/2016 0846   MCV 91.8 08/30/2020 0837   MCV 86 05/01/2016 0846   MCV 87 10/26/2013 1132   MCH 30.7 08/30/2020 0837   MCHC 33.5 08/30/2020 0837   RDW 14.3 08/30/2020 0837   RDW 14.8 05/01/2016 0846   RDW 14.7 (H) 10/26/2013 1132   LYMPHSABS 1,636 08/30/2020 0837   LYMPHSABS 1.5 05/01/2016 0846   LYMPHSABS 2.7 10/26/2013 1132    MONOABS 462 10/13/2016 0827   MONOABS 0.5 10/26/2013 1132   EOSABS 108 08/30/2020 0837   EOSABS 0.2 05/01/2016 0846   EOSABS 0.2 10/26/2013 1132   BASOSABS 38 08/30/2020 0837   BASOSABS 0.0 05/01/2016 0846   BASOSABS 0.1 10/26/2013 1132    Hgb A1C Lab Results  Component Value Date   HGBA1C 5.3 08/30/2020           Assessment & Plan:  Trigger Finger, Left Middle:  Referral to hand surgery for injection Encourage range of motion exercises of the fingers  Follow-up with your PCP as previously scheduled Webb Silversmith, NP

## 2022-08-26 NOTE — Patient Instructions (Signed)
Trigger Finger  Trigger finger, also called stenosing tenosynovitis,  is a condition that causes a finger to get stuck in a bent position. Each finger has a tendon, which is a tough, cord-like tissue that connects muscle to bone, and each tendon passes through a tunnel of tissue called a tendon sheath. To move your finger, your tendon needs to glide freely through the sheath. Trigger finger happens when the tendon or the sheath thickens, making it difficult to move your finger. Trigger finger can affect any finger or a thumb. It may affect more than one finger. Mild cases may clear up with rest and medicine. Severe cases require more treatment. What are the causes? Trigger finger is caused by a thickened finger tendon or tendon sheath. The cause of this thickening is not known. What increases the risk? The following factors may make you more likely to develop this condition: Doing activities that require a strong grip. Having rheumatoid arthritis, gout, or diabetes. Being 40-60 years old. Being male. What are the signs or symptoms? Symptoms of this condition include: Pain when bending or straightening your finger. Tenderness or swelling where your finger attaches to the palm of your hand. A lump in the palm of your hand or on the inside of your finger. Hearing a noise like a pop or a snap when you try to straighten your finger. Feeling a catching or locking sensation when you try to straighten your finger. Being unable to straighten your finger. How is this diagnosed? This condition is diagnosed based on your symptoms and a physical exam. How is this treated? This condition may be treated by: Resting your finger and avoiding activities that make symptoms worse. Wearing a finger splint to keep your finger extended. Taking NSAIDs, such as ibuprofen, to relieve pain and swelling. Doing gentle exercises to stretch the finger as told by your health care provider. Having medicine that reduces  swelling and inflammation (steroids) injected into the tendon sheath. Injections may need to be repeated. Having surgery to open the tendon sheath. This may be done if other treatments do not work and you cannot straighten your finger. You may need physical therapy after surgery. Follow these instructions at home: If you have a splint: Wear the splint as told by your health care provider. Remove it only as told by your health care provider. Loosen it if your fingers tingle, become numb, or turn cold and blue. Keep it clean. If the splint is not waterproof: Do not let it get wet. Cover it with a watertight covering when you take a bath or shower. Managing pain, stiffness, and swelling     If directed, apply heat to the affected area as often as told by your health care provider. Use the heat source that your health care provider recommends, such as a moist heat pack or a heating pad. Place a towel between your skin and the heat source. Leave the heat on for 20-30 minutes. Remove the heat if your skin turns bright red. This is especially important if you are unable to feel pain, heat, or cold. You may have a greater risk of getting burned. If directed, put ice on the painful area. To do this: If you have a removable splint, remove it as told by your health care provider. Put ice in a plastic bag. Place a towel between your skin and the bag or between your splint and the bag. Leave the ice on for 20 minutes, 2-3 times a day.  Activity Rest   your finger as told by your health care provider. Avoid activities that make the pain worse. Return to your normal activities as told by your health care provider. Ask your health care provider what activities are safe for you. Do exercises as told by your health care provider. Ask your health care provider when it is safe to drive if you have a splint on your hand. General instructions Take over-the-counter and prescription medicines only as told by  your health care provider. Keep all follow-up visits as told by your health care provider. This is important. Contact a health care provider if: Your symptoms are not improving with home care. Summary Trigger finger, also called stenosing tenosynovitis, causes your finger to get stuck in a bent position. This can make it difficult and painful to straighten your finger. This condition develops when a finger tendon or tendon sheath thickens. Treatment may include resting your finger, wearing a splint, and taking medicines. In severe cases, surgery to open the tendon sheath may be needed. This information is not intended to replace advice given to you by your health care provider. Make sure you discuss any questions you have with your health care provider. Document Revised: 04/24/2019 Document Reviewed: 04/24/2019 Elsevier Patient Education  2023 Elsevier Inc.  

## 2022-08-26 NOTE — Assessment & Plan Note (Signed)
We will send a note to cardiology seeing if they are okay with switch from Eliquis to aspirin He will continue amiodarone and metoprolol for now

## 2022-10-01 ENCOUNTER — Other Ambulatory Visit: Payer: Self-pay | Admitting: Cardiovascular Disease

## 2022-10-01 DIAGNOSIS — E785 Hyperlipidemia, unspecified: Secondary | ICD-10-CM

## 2022-11-02 ENCOUNTER — Telehealth: Payer: Self-pay

## 2022-11-02 NOTE — Telephone Encounter (Signed)
Pt states he is having S/e from tamsulosin-  can't sleep very long- 2-3 hours and then he wakes up and memory loss.  Pt think that the cialis and flomax should not be taken together.   Pls advise. Pt aware Erlene Quan will get his message tomorrow.

## 2022-11-02 NOTE — Telephone Encounter (Signed)
Message left on triage line at 914am  Pt states he has a medication question.   Returned pts call at 955am -n/a. [place].

## 2022-11-03 NOTE — Telephone Encounter (Signed)
Left message for patient to call back to discuss information.

## 2022-11-03 NOTE — Telephone Encounter (Signed)
There is no interaction/contraindication to taking Flomax and Cialis together, in fact this is a common practice.  The symptoms that you are describing including sleep disturbance and memory loss are likely not associated with these medications.  When we describe symptoms from Flomax, we really think of dizziness, lightheaded and fatigue along with ejaculatory issues.    If you would like to try stopping 1 or the other, please do so and if your urinary symptoms worsen, please schedule follow-up to discuss further (PA is fine).  Hollice Espy, MD

## 2022-11-17 ENCOUNTER — Ambulatory Visit (INDEPENDENT_AMBULATORY_CARE_PROVIDER_SITE_OTHER): Payer: Medicare Other

## 2022-11-17 DIAGNOSIS — Z23 Encounter for immunization: Secondary | ICD-10-CM

## 2022-11-25 ENCOUNTER — Ambulatory Visit (INDEPENDENT_AMBULATORY_CARE_PROVIDER_SITE_OTHER): Payer: Medicare Other | Admitting: Family Medicine

## 2022-11-25 ENCOUNTER — Encounter: Payer: Self-pay | Admitting: Family Medicine

## 2022-11-25 VITALS — BP 126/75 | HR 63 | Temp 96.9°F | Wt 180.0 lb

## 2022-11-25 DIAGNOSIS — R55 Syncope and collapse: Secondary | ICD-10-CM

## 2022-11-25 DIAGNOSIS — R0609 Other forms of dyspnea: Secondary | ICD-10-CM

## 2022-11-25 DIAGNOSIS — I480 Paroxysmal atrial fibrillation: Secondary | ICD-10-CM

## 2022-11-25 NOTE — Progress Notes (Signed)
Subjective:    Patient ID: Andrew Benson, male    DOB: 01-19-1946, 76 y.o.   MRN: [id number]  Andrew Benson is a 76 y.o. male presenting on 11/25/2022 for Dizziness (Started about two weeks. )   HPI  Near Syncope, vasovagal Exertional Dyspnea History Paroxysmal AFib  >1 month symptoms with episodic flares, seems to have some more severe symptoms at time with lightheadedness and dizziness, and with a warmth sensation. He had a nearly pass out episode but did not actually pass out. He describes physical exertion with lifting boxes and heavier would provoke his symptoms. 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 - Thought caffeine was contributing, now discontinued. No improvement.  He has a non exertional episode, while in the car recently. Same symptoms.  Past history of Atrial Fibrillation Years ago 27+ dx with episode with provoked symptoms. Has not had any significant recurrence since  Followed by Dr Rockey Situ Cardiology, has been managed on anticoagulation with Eliquis, also Amiodarone, Metoprolol. No recent visit with Cardiology  BP has been stable. No recent significant low vs high  Checks BP often at home, low normal readings in morning. Current Meds - Lisinopril 2.'5mg'$  daily and has Metoprolol '25mg'$  BID PRN ONLY Afib Off ASA Reports good compliance, took meds today. Tolerating well, w/o complaints. Denies CP, dyspnea, HA, edema, dizziness / lightheadedness, bleeding problem or bruising       11/25/2022    3:24 PM 08/26/2022    9:39 AM 11/28/2021    8:27 AM  Depression screen PHQ 2/9  Decreased Interest 0 0 0  Down, Depressed, Hopeless 0 0 0  PHQ - 2 Score 0 0 0  Altered sleeping 0 0   Tired, decreased energy 0 0   Change in appetite 0 0   Feeling bad or failure about yourself  0 0   Trouble concentrating 0 0   Moving slowly or fidgety/restless 0 0   Suicidal thoughts 0 0   PHQ-9 Score 0 0   Difficult doing work/chores Not difficult at all Not difficult at all      Social History   Tobacco Use   Smoking status: Former    Packs/day: 1.00    Years: 20.00    Total pack years: 20.00    Types: Cigarettes    Quit date: 02/01/1993    Years since quitting: 29.8   Smokeless tobacco: Never  Vaping Use   Vaping Use: Never used  Substance Use Topics   Alcohol use: Yes    Alcohol/week: 0.0 standard drinks of alcohol    Comment: social drinker.   Drug use: No    Review of Systems Per HPI unless specifically indicated above     Objective:    BP 126/75 (BP Location: Right Arm, Patient Position: Sitting, Cuff Size: Normal)   Pulse 63   Temp (!) 96.9 F (36.1 C) (Temporal)   Wt 180 lb (81.6 kg)   SpO2 100%   BMI 25.10 kg/m   Wt Readings from Last 3 Encounters:  11/25/22 180 lb (81.6 kg)  08/26/22 180 lb (81.6 kg)  05/28/22 182 lb 6.4 oz (82.7 kg)    Physical Exam Vitals and nursing note reviewed.  Constitutional:      General: He is not in acute distress.    Appearance: He is well-developed. He is not diaphoretic.     Comments: Well-appearing, comfortable, cooperative  HENT:     Head: Normocephalic and atraumatic.  Eyes:     General:  Right eye: No discharge.        Left eye: No discharge.     Conjunctiva/sclera: Conjunctivae normal.  Neck:     Thyroid: No thyromegaly.  Cardiovascular:     Rate and Rhythm: Normal rate and regular rhythm.     Pulses: Normal pulses.     Heart sounds: Normal heart sounds. No murmur heard. Pulmonary:     Effort: Pulmonary effort is normal. No respiratory distress.     Breath sounds: Normal breath sounds. No wheezing or rales.  Musculoskeletal:        General: Normal range of motion.     Cervical back: Normal range of motion and neck supple.  Lymphadenopathy:     Cervical: No cervical adenopathy.  Skin:    General: Skin is warm and dry.     Findings: No erythema or rash.  Neurological:     Mental Status: He is alert and oriented to person, place, and time. Mental status is at baseline.   Psychiatric:        Behavior: Behavior normal.     Comments: Well groomed, good eye contact, normal speech and thoughts    Results for orders placed or performed in visit on 04/15/22  PSA  Result Value Ref Range   Prostate Specific Ag, Serum 5.3 (H) 0.0 - 4.0 ng/mL  Bladder Scan (Post Void Residual) in office  Result Value Ref Range   Scan Result 15       Assessment & Plan:   Problem List Items Addressed This Visit     Paroxysmal atrial fibrillation ([place])   Other Visit Diagnoses     Vasovagal near syncope    -  Primary   Exertional dyspnea           Hemodynamically stable today Constellation of symptoms with recent worsening >1 month near vasovagal episodes w/ some exertional dyspnea. Question if paroxysmal atrial fib related or other etiology  Already on med management anticoagulation and rate control / amiodarone  No obvious identified med side effects causing. It does not seem to be nutrition / hydration or other factors.  Would recommend further Cardiac work up first before pursued other more benign etiology  Please schedule ASAP with Dr Rockey Situ or one of his associates with Cardiology. To evaluate the episodes you are having more often.  I am concerned it could be related to AFib or Vasovagal situation that is recurring.  I can order the ZIO Patch monitor if you prefer, or I would suggest Dr Rockey Situ will order it when you see them if it is soon. Let me know  No orders of the defined types were placed in this encounter.    Follow up plan: Return if symptoms worsen or fail to improve.   [name], [facility] 11/25/2022, 2:35 PM

## 2022-11-25 NOTE — Patient Instructions (Addendum)
Thank you for coming to the office today.  Please schedule ASAP with Dr Rockey Situ or one of his associates with Cardiology. To evaluate the episodes you are having more often.  I am concerned it could be related to AFib or Vasovagal situation that is recurring.  I can order the ZIO Patch monitor if you prefer, or I would suggest Dr Rockey Situ will order it when you see them if it is soon. Let me know  -------------  As discussed, I do not know the exact cause of your NEAR syncopal episodes (passing out), usually we divide this into either concerning or less concerning syncopal episodes. The most common type is Vasovagal Syncope, often described as you did with feeling flushed or sweating, lightheaded or dizzy, usually these are random episodes, triggered by a variety of causes (can be stress, emotional, physical, straining with exercise or bowel movement even, dehydration poor intake). Still a medical concern, but usually more of a benign problem, there is limited treatment or testing to be done for this type of syncope.  The concerning syncope is either caused by Cardiac (Heart) or Neurogenic (Brain), usually provoked by exertional activity, associated with high risk symptoms chest pain, tightness or pressure, shortness of breath, headache, or stroke like symptoms significant facial or arm/leg weakness, numbness, or related to seizure activity - if you develop any of these symptoms seek help immediately at hospital ED.    From now on, be mindful of possible syncopal episodes, I would encourage increase water hydration, try using a bottle or way to measure water intake, goal for at least 12-16 oz container about 2-3 times a day, can reduce tea intake, as this tends to cause you to be more dehydrated.   If syncopal episode occurs again without any of the above significant red flag symptoms, and it resolves on it's own and you don't feel persistently sick, then you may notify our office, follow-up in our  office, or seek treatment at Urgent Care, we can discuss future referrals such as Cardiology and other testing.  Please schedule a Follow-up Appointment to: Return if symptoms worsen or fail to improve.  If you have any other questions or concerns, please feel free to call the office or send a message through [place]. You may also schedule an earlier appointment if necessary.  Additionally, you may be receiving a survey about your experience at our office within a few days to 1 week by e-mail or mail. We value your feedback.  [name], DO [place]

## 2022-11-27 ENCOUNTER — Other Ambulatory Visit
Admission: RE | Admit: 2022-11-27 | Discharge: 2022-11-27 | Disposition: A | Discharge status: Home / Self Care | Payer: Medicare Other | Source: Ambulatory Visit | Attending: Nurse Practitioner | Admitting: Nurse Practitioner

## 2022-11-27 ENCOUNTER — Ambulatory Visit (INDEPENDENT_AMBULATORY_CARE_PROVIDER_SITE_OTHER): Payer: Medicare Other

## 2022-11-27 ENCOUNTER — Encounter: Payer: Self-pay | Admitting: Nurse Practitioner

## 2022-11-27 ENCOUNTER — Ambulatory Visit: Payer: Medicare Other | Attending: Nurse Practitioner | Admitting: Nurse Practitioner

## 2022-11-27 VITALS — BP 107/70 | HR 60 | Ht 71.0 in | Wt 181.0 lb

## 2022-11-27 DIAGNOSIS — I10 Essential (primary) hypertension: Secondary | ICD-10-CM

## 2022-11-27 DIAGNOSIS — R55 Syncope and collapse: Secondary | ICD-10-CM

## 2022-11-27 DIAGNOSIS — I480 Paroxysmal atrial fibrillation: Secondary | ICD-10-CM | POA: Diagnosis not present

## 2022-11-27 DIAGNOSIS — N182 Chronic kidney disease, stage 2 (mild): Secondary | ICD-10-CM

## 2022-11-27 DIAGNOSIS — R002 Palpitations: Secondary | ICD-10-CM | POA: Diagnosis not present

## 2022-11-27 DIAGNOSIS — E782 Mixed hyperlipidemia: Secondary | ICD-10-CM

## 2022-11-27 LAB — COMPREHENSIVE METABOLIC PANEL
ALT: 18 U/L (ref 0–44)
AST: 24 U/L (ref 15–41)
Albumin: 3.7 g/dL (ref 3.5–5.0)
Alkaline Phosphatase: 74 U/L (ref 38–126)
Anion gap: 3 — ABNORMAL LOW (ref 5–15)
BUN: 18 mg/dL (ref 8–23)
CO2: 30 mmol/L (ref 22–32)
Calcium: 9.5 mg/dL (ref 8.9–10.3)
Chloride: 110 mmol/L (ref 98–111)
Creatinine, Ser: 1.3 mg/dL — ABNORMAL HIGH (ref 0.61–1.24)
GFR, Estimated: 57 mL/min — ABNORMAL LOW (ref >60)
Glucose, Bld: 100 mg/dL — ABNORMAL HIGH (ref 70–99)
Potassium: 4.2 mmol/L (ref 3.5–5.1)
Sodium: 143 mmol/L (ref 135–145)
Total Bilirubin: 0.9 mg/dL (ref 0.3–1.2)
Total Protein: 6.9 g/dL (ref 6.5–8.1)

## 2022-11-27 LAB — CBC
HCT: 43.9 % (ref 39.0–52.0)
Hemoglobin: 15 g/dL (ref 13.0–17.0)
MCH: 30.4 pg (ref 26.0–34.0)
MCHC: 34.2 g/dL (ref 30.0–36.0)
MCV: 88.9 fL (ref 80.0–100.0)
Platelets: 135 10*3/uL — ABNORMAL LOW (ref 150–400)
RBC: 4.94 MIL/uL (ref 4.22–5.81)
RDW: 14.4 % (ref 11.5–15.5)
WBC: 4.3 10*3/uL (ref 4.0–10.5)
nRBC: 0 % (ref 0.0–0.2)

## 2022-11-27 LAB — MAGNESIUM: Magnesium: 2.2 mg/dL (ref 1.7–2.4)

## 2022-11-27 LAB — TSH: TSH: 0.594 u[IU]/mL (ref 0.350–4.500)

## 2022-11-27 MED ORDER — METOPROLOL TARTRATE 25 MG PO TABS: 12.5000 mg | ORAL_TABLET | Freq: Two times a day (BID) | ORAL | 3 refills | Status: DC

## 2022-11-27 MED ORDER — APIXABAN 5 MG PO TABS: 5.0000 mg | ORAL_TABLET | Freq: Two times a day (BID) | ORAL | 6 refills | Status: DC

## 2022-11-27 NOTE — Progress Notes (Signed)
Office Visit    Patient Name: Andrew Benson Date of Encounter: 11/27/2022  Primary Care Provider:  Olin Hauser, DO Primary Cardiologist:  Ida Rogue, MD  Chief Complaint    76 year old male with history of paroxysmal atrial fibrillation, hypertension hyperlipidemia, and stage II chronic kidney disease, presents for follow-up related to palpitations and presyncope.  Past Medical History    Past Medical History:  Diagnosis Date   Asthma    CKD (chronic kidney disease), stage II    Essential hypertension    Hyperlipidemia    PAF (paroxysmal atrial fibrillation) (HCC)    a. CHA2DS2VASc = 3;  b. takes amio/eliquis/metoprolol prn for PAF.   TMJ locking    Past Surgical History:  Procedure Laterality Date   COLONOSCOPY WITH PROPOFOL N/A 12/29/2017   Procedure: COLONOSCOPY WITH PROPOFOL;  Surgeon: Manya Silvas, MD;  Location: [facility] ENDOSCOPY;  Service: Endoscopy;  Laterality: N/A;   COLONOSCOPY WITH PROPOFOL N/A 07/02/2021   Procedure: COLONOSCOPY WITH PROPOFOL;  Surgeon: Toledo, [name], MD;  Location: [facility] ENDOSCOPY;  Service: Gastroenterology;  Laterality: N/A;   FINGER SURGERY     left index finger   INGUINAL HERNIA REPAIR Right 2007   INGUINAL HERNIA REPAIR Left 2008   done x 2   KNEE SURGERY     left     Allergies  Allergies  Allergen Reactions   Penicillins     Rash Rash     History of Present Illness    76 year old male with above past medical history including paroxysmal atrial fibrillation, hypertension, hyperlipidemia, and stage II chronic kidney disease.  He has a history of atrial fibrillation dating back approximate 25 years.  His most recent profound episode occurred in 2019 while drinking coffee.  He has been taking as needed metoprolol and amiodarone, and has been anticoagulated with Eliquis in the setting of a CHA2DS2-VASc of 3-4 (hypertension, age x 2, chronic small vessel disease noted on brain MRI previously).  Andrew Benson  was last seen in cardiology clinic in April 2023, at which time he was doing well.  Over the past month or so, he has been experiencing sudden onset of palpitations associated with presyncope, lasting for a few seconds, and resolving spontaneously.  These can happen with rest or with activity.  Initially, they were occurring fairly frequently but over the past few weeks, symptoms have occurred just a couple times a week.  Though he is prescribed amiodarone and metoprolol, he takes these as needed only and historically has only ever taken these when he knew he was in atrial fibrillation, which has not been for some time.  He has not tried taking either medication in the setting of recent palpitations.  He has never lost consciousness or had prolonged palpitations or presyncope.  He denies chest pain, dyspnea, PND, orthopnea, dizziness, syncope, edema, or early satiety.  He remains on Eliquis therapy but notes that he is only taking this once a day.  Home Medications    Current Outpatient Medications  Medication Sig Dispense Refill   amiodarone (PACERONE) 200 MG tablet TAKE 1 TABLET (200 MG TOTAL) BY MOUTH DAILY AS NEEDED (TAKE FOR A FIB). 90 tablet 3   fluticasone (FLONASE) 50 MCG/ACT nasal spray Place 2 sprays into both nostrils daily. Use for 4-6 weeks then stop and use seasonally or as needed. 16 g 3   lisinopril (ZESTRIL) 2.5 MG tablet TAKE 1 TABLET BY MOUTH EVERY DAY 30 tablet 10   Multiple Vitamin (MULTIVITAMIN) capsule  Take 1 capsule by mouth daily.     rosuvastatin (CRESTOR) 20 MG tablet Take 1 tablet (20 mg total) by mouth daily. 90 tablet 1   tadalafil (CIALIS) 20 MG tablet Take 1 tablet (20 mg total) by mouth daily as needed for erectile dysfunction. 30 tablet 0   tamsulosin (FLOMAX) 0.4 MG CAPS capsule TAKE 1 CAPSULE BY MOUTH EVERY DAY 90 capsule 3   apixaban (ELIQUIS) 5 MG TABS tablet Take 1 tablet (5 mg total) by mouth 2 (two) times daily. 60 tablet 6   metoprolol tartrate (LOPRESSOR) 25  MG tablet Take 0.5 tablets (12.5 mg total) by mouth 2 (two) times daily. 90 tablet 3   No current facility-administered medications for this visit.     Review of Systems    Palpitations associated with presyncope as outlined above.  He denies chest pain, dyspnea, PND, orthopnea, dizziness, syncope, edema, or early satiety.  All other systems reviewed and are otherwise negative except as noted above.    Physical Exam    VS:  BP 107/70 (BP Location: Left Arm, Patient Position: Sitting, Cuff Size: Large)   Pulse 60   Ht '5\' 11"'$  (1.803 m)   Wt 181 lb (82.1 kg)   SpO2 98%   BMI 25.24 kg/m  , BMI Body mass index is 25.24 kg/m.    Orthostatic VS for the past 24 hrs:  BP- Lying Pulse- Lying BP- Sitting Pulse- Sitting BP- Standing at 0 minutes Pulse- Standing at 0 minutes  11/27/22 0813 126/78 57 124/78 72 132/85 76    GEN: Well nourished, well developed, in no acute distress. HEENT: normal. Neck: Supple, no JVD, carotid bruits, or masses. Cardiac: RRR, no murmurs, rubs, or gallops. No clubbing, cyanosis, edema.  Radials 2+/PT 2+ and equal bilaterally.  Respiratory:  Respirations regular and unlabored, clear to auscultation bilaterally. GI: Soft, nontender, nondistended, BS + x 4. MS: no deformity or atrophy. Skin: warm and dry, no rash. Neuro:  Strength and sensation are intact. Psych: Normal affect.  Accessory Clinical Findings    ECG personally reviewed by me today -regular sinus rhythm with sinus arrhythmia, 60, nonspecific T abnormality- no acute changes.  Lab Results  Component Value Date   WBC 4.3 11/27/2022   HGB 15.0 11/27/2022   HCT 43.9 11/27/2022   MCV 88.9 11/27/2022   PLT 135 (L) 11/27/2022   Lab Results  Component Value Date   CREATININE 1.30 (H) 11/27/2022   BUN 18 11/27/2022   NA 143 11/27/2022   K 4.2 11/27/2022   CL 110 11/27/2022   CO2 30 11/27/2022   Lab Results  Component Value Date   ALT 18 11/27/2022   AST 24 11/27/2022   ALKPHOS 74 11/27/2022    BILITOT 0.9 11/27/2022   Lab Results  Component Value Date   CHOL 170 03/23/2022   HDL 59 03/23/2022   LDLCALC 98 03/23/2022   TRIG 67 03/23/2022   CHOLHDL 2.9 03/23/2022    Lab Results  Component Value Date   HGBA1C 5.3 08/30/2020   Lab Results  Component Value Date   TSH 0.594 11/27/2022     Assessment & Plan    1.  Palpitations/presyncope: Patient with a 1 to 104-monthhistory of palpitations associated with presyncope.  Symptoms are generally brief in nature and over the past few weeks, have decreased in their frequency though are still at least occurring a few times a week.  Orthostatic vital signs are normal today.  I did obtain lab work today  which showed stable renal function and normal H&H, electrolytes, and TSH.  I have placed a 2-week ZIO AT (live telemetry) and have asked him to start taking his metoprolol 12.5 mg twice daily.  2.  Paroxysmal atrial fibrillation: In sinus rhythm today.  No known paroxysms.  He has a on amiodarone at home but only uses as needed, and has not needed in a very long time.  He also has metoprolol at home which she has not been using but will start taking 12 and half milligrams twice daily in the setting of #1.  Finally, he has been taking Eliquis but only taking 5 mg once a day.  We discussed the importance of taking this twice a day as prescribed and he will start doing this.  3.  Essential hypertension: Stable.  4.  Hyperlipidemia: He remains on rosuvastatin therapy with an LDL of 98 earlier this year.  5.  Stage II chronic kidney disease: Creatinine mildly elevated at 1.3.  Overall stable.  6.  Disposition: Follow-up Zio monitor.  Follow-up in clinic in 1 month or sooner if necessary.   Murray Hodgkins, NP 11/27/2022, 1:26 PM

## 2022-11-27 NOTE — Patient Instructions (Addendum)
Medication Instructions:  - Your physician has recommended you make the following change in your medication:   1) Start taking your eliquis '5mg'$  twice daily  2) Start taking your metoprolol 25 mg 1/2 tab (12.5 mg) twice daily  *If you need a refill on your cardiac medications before your next appointment, please call your pharmacy*   Lab Work: Present to the medical mall for a complete blood count, complete metabolic panel, TSH, and magnesium If you have labs (blood work) drawn today and your tests are completely normal, you will receive your results only by: [place] (if you have MyChart) OR A paper copy in the mail If you have any lab test that is abnormal or we need to change your treatment, we will call you to review the results.  Medical Mall Entrance at [facility] 1st desk on the right to check in (REGISTRATION)  Lab hours: Monday- Friday (7:30 am- 5:30 pm)  Testing/Procedures: - Heart Monitor:  Length of Wear: 14 days  Your monitor will be mailed to your home address within 3-5 business days. However, if you have not received your monitor after 5 business days please send [place] a MyChart message or call the office at [phone], so we may follow up on this for you.   Your physician has recommended that you wear a Zio AT (heart) monitor.   This monitor is a medical device that records the heart's electrical activity. Doctors most often use these monitors to diagnose arrhythmias. Arrhythmias are problems with the speed or rhythm of the heartbeat. The monitor is a small device applied to your chest. You can wear one while you do your normal daily activities. While wearing this monitor if you have any symptoms to push the button and record what you felt. Once you have worn this monitor for the period of time provider prescribed (Usually 14 days), you will return the monitor device in the postage paid box. Once it is returned they will download the data collected and provide [place] with  a report which the provider will then review and we will call you with those results. Important tips:  Avoid showering during the first 24 hours of wearing the monitor. Avoid excessive sweating to help maximize wear time. Do not submerge the device, no hot tubs, and no swimming pools. Keep any lotions or oils away from the patch. After 24 hours you may shower with the patch on. Take brief showers with your back facing the shower head.  Do not remove patch once it has been placed because that will interrupt data and decrease adhesive wear time. Push the button when you have any symptoms and write down what you were feeling. Once you have completed wearing your monitor, remove and place into box which has postage paid and place in your outgoing mailbox.  If for some reason you have misplaced your box then call our office and we can provide another box and/or mail it off for you.       Follow-Up: At [facility], you and your health needs are our priority.  As part of our continuing mission to provide you with exceptional heart care, we have created designated Provider Care Teams.  These Care Teams include your primary Cardiologist (physician) and Advanced Practice Providers (APPs -  Physician Assistants and Nurse Practitioners) who all work together to provide you with the care you need, when you need it.  We recommend signing up for the patient portal called "MyChart".  Sign  up information is provided on this After Visit Summary.  MyChart is used to connect with patients for Virtual Visits (Telemedicine).  Patients are able to view lab/test results, encounter notes, upcoming appointments, etc.  Non-urgent messages can be sent to your provider as well.   To learn more about what you can do with MyChart, go to [email or web address].    Your next appointment:   1 month(s)  The format for your next appointment:   In Person  Provider:   Ida Rogue, MD or Murray Hodgkins, NP     Other Instructions   Important Information About Sugar

## 2022-11-30 DIAGNOSIS — R55 Syncope and collapse: Secondary | ICD-10-CM | POA: Diagnosis not present

## 2022-11-30 DIAGNOSIS — I480 Paroxysmal atrial fibrillation: Secondary | ICD-10-CM | POA: Diagnosis not present

## 2022-11-30 DIAGNOSIS — R002 Palpitations: Secondary | ICD-10-CM | POA: Diagnosis not present

## 2022-12-04 ENCOUNTER — Ambulatory Visit (INDEPENDENT_AMBULATORY_CARE_PROVIDER_SITE_OTHER): Payer: Medicare Other

## 2022-12-04 VITALS — BP 110/72 | Ht 71.0 in | Wt 183.2 lb

## 2022-12-04 DIAGNOSIS — Z0000 Encounter for general adult medical examination without abnormal findings: Secondary | ICD-10-CM

## 2022-12-04 NOTE — Progress Notes (Signed)
Subjective:   Andrew Benson is a 76 y.o. male who presents for Medicare Annual/Subsequent preventive examination.  Review of Systems     Cardiac Risk Factors include: advanced age (>37mn, >>82women);male gender;hypertension     Objective:    Today's Vitals   12/04/22 0807  BP: 110/72  Weight: 183 lb 3.2 oz (83.1 kg)  Height: '5\' 11"'$  (1.803 m)   Body mass index is 25.55 kg/m.     12/04/2022    8:14 AM 11/28/2021    8:26 AM 07/02/2021   12:21 PM 11/05/2020   10:00 AM 02/14/2019    2:31 PM 02/01/2018    9:40 AM 12/29/2017    8:22 AM  Advanced Directives  Does Patient Have a Medical Advance Directive? No Yes No;Yes Yes Yes Yes Yes  Type of ACorporate treasurerof APost MountainLiving will HMount PleasantLiving will [place] will Living will;Healthcare Power of [place]Living will Living will;Healthcare Power of Attorney  Does patient want to make changes to medical advance directive?  Yes (Inpatient - patient defers changing a medical advance directive and declines information at this time)       Copy of [place] Chart?  No - copy requested  No - copy requested No - copy requested No - copy requested No - copy requested  Would patient like information on creating a medical advance directive? No - Patient declined          Current Medications (verified) Outpatient Encounter Medications as of 12/04/2022  Medication Sig   amiodarone (PACERONE) 200 MG tablet TAKE 1 TABLET (200 MG TOTAL) BY MOUTH DAILY AS NEEDED (TAKE FOR A FIB).   apixaban (ELIQUIS) 5 MG TABS tablet Take 1 tablet (5 mg total) by mouth 2 (two) times daily.   fluticasone (FLONASE) 50 MCG/ACT nasal spray Place 2 sprays into both nostrils daily. Use for 4-6 weeks then stop and use seasonally or as needed.   lisinopril (ZESTRIL) 2.5 MG tablet TAKE 1 TABLET BY MOUTH EVERY DAY   metoprolol tartrate (LOPRESSOR) 25 MG  tablet Take 0.5 tablets (12.5 mg total) by mouth 2 (two) times daily.   Multiple Vitamin (MULTIVITAMIN) capsule Take 1 capsule by mouth daily.   rosuvastatin (CRESTOR) 20 MG tablet Take 1 tablet (20 mg total) by mouth daily.   tadalafil (CIALIS) 20 MG tablet Take 1 tablet (20 mg total) by mouth daily as needed for erectile dysfunction.   tamsulosin (FLOMAX) 0.4 MG CAPS capsule TAKE 1 CAPSULE BY MOUTH EVERY DAY   No facility-administered encounter medications on file as of 12/04/2022.    Allergies (verified) Penicillins   History: Past Medical History:  Diagnosis Date   Asthma    CKD (chronic kidney disease), stage II    Essential hypertension    Hyperlipidemia    PAF (paroxysmal atrial fibrillation) (HCC)    a. CHA2DS2VASc = 3;  b. takes amio/eliquis/metoprolol prn for PAF.   TMJ locking    Past Surgical History:  Procedure Laterality Date   COLONOSCOPY WITH PROPOFOL N/A 12/29/2017   Procedure: COLONOSCOPY WITH PROPOFOL;  Surgeon: EManya Silvas MD;  [facility]  Service: Endoscopy;  Laterality: N/A;   COLONOSCOPY WITH PROPOFOL N/A 07/02/2021   Procedure: COLONOSCOPY WITH PROPOFOL;  Surgeon: Toledo, TBenay Pike MD;  Location: [facility] ENDOSCOPY;  Service: Gastroenterology;  Laterality: N/A;   FINGER SURGERY     left index finger   INGUINAL HERNIA REPAIR Right 2007  INGUINAL HERNIA REPAIR Left 2008   done x 2   KNEE SURGERY     left    Family History  Problem Relation Age of Onset   Hyperlipidemia Mother    Hypertension Mother    Heart disease Mother    Heart disease Sister        valve repair    Diabetes Brother    Hypertension Brother    Social History   Socioeconomic History   Marital status: Married    Spouse name: Not on file   Number of children: Not on file   Years of education: Not on file   Highest education level: Associate degree: academic program  Occupational History   Not on file  Tobacco Use   Smoking status: Former    Packs/day:  1.00    Years: 20.00    Total pack years: 20.00    Types: Cigarettes    Quit date: 02/01/1993    Years since quitting: 29.8   Smokeless tobacco: Never  Vaping Use   Vaping Use: Never used  Substance and Sexual Activity   Alcohol use: Not Currently    Comment: social drinker.   Drug use: No   Sexual activity: Not on file  Other Topics Concern   Not on file  Social History Narrative   Participates in church activities, and food bank 3 days a week   Gym 2 times weekly- treadmill    Social Determinants of Health   Financial Resource Strain: Low Risk  (12/04/2022)   Overall Financial Resource Strain (CARDIA)    Difficulty of Paying Living Expenses: Not hard at all  Food Insecurity: No Food Insecurity (12/04/2022)   Hunger Vital Sign    Worried About Running Out of Food in the Last Year: Never true    Ran Out of Food in the Last Year: Never true  Transportation Needs: No Transportation Needs (12/04/2022)   PRAPARE - Hydrologist (Medical): No    Lack of Transportation (Non-Medical): No  Physical Activity: Sufficiently Active (12/04/2022)   Exercise Vital Sign    Days of Exercise per Week: 5 days    Minutes of Exercise per Session: 120 min  Stress: No Stress Concern Present (12/04/2022)   [place]    Feeling of Stress : Not at all  Social Connections: Moderately Integrated (12/04/2022)   Social Connection and Isolation Panel [NHANES]    Frequency of Communication with Friends and Family: More than three times a week    Frequency of Social Gatherings with Friends and Family: More than three times a week    Attends Religious Services: More than 4 times per year    Active Member of [employer] or Organizations: No    Attends Music therapist: Never    Marital Status: Married    Tobacco Counseling Counseling given: Not Answered   Clinical Intake:  Pre-visit preparation  completed: Yes  Pain : No/denies pain     Nutritional Risks: None Diabetes: No  How often do you need to have someone help you when you read instructions, pamphlets, or other written materials from your doctor or pharmacy?: 1 - Never  Diabetic?no  Interpreter Needed?: No  Information entered by :: Kirke Shaggy, LPN   Activities of Daily Living    12/04/2022    8:15 AM 08/26/2022    9:40 AM  In your present state of health, do you have any difficulty performing  the following activities:  Hearing? 0 0  Vision? 0 0  Difficulty concentrating or making decisions? 0 0  Walking or climbing stairs? 0 0  Dressing or bathing? 0 0  Doing errands, shopping? 0 0  Preparing Food and eating ? N   Using the Toilet? N   In the past six months, have you accidently leaked urine? N   Do you have problems with loss of bowel control? N   Managing your Medications? N   Managing your Finances? N   Housekeeping or managing your Housekeeping? N     Patient Care Team: Olin Hauser, DO as PCP - General (Family Medicine) Rockey Situ Kathlene November, MD as PCP - Cardiology (Cardiology) Minna Merritts, MD as Consulting Physician (Cardiology)  Indicate any recent Medical Services you may have received from other than [facility] providers in the past year (date may be approximate).     Assessment:   This is a routine wellness examination for [place].  Hearing/Vision screen Hearing Screening - Comments:: No aids Vision Screening - Comments:: Readers- Dr.Bulakowski  Dietary issues and exercise activities discussed: Current Exercise Habits: Home exercise routine, Type of exercise: walking, Time (Minutes): > 60, Frequency (Times/Week): 5, Weekly Exercise (Minutes/Week): 0, Intensity: Mild   Goals Addressed             This Visit's Progress    DIET - EAT MORE FRUITS AND VEGETABLES         Depression Screen    12/04/2022    8:11 AM 11/25/2022    3:24 PM 08/26/2022    9:39 AM 11/28/2021     8:27 AM 11/28/2021    8:23 AM 11/05/2020   10:01 AM 09/04/2020    8:52 AM  PHQ 2/9 Scores  PHQ - 2 Score 0 0 0 0 0 0 0  PHQ- 9 Score 0 0 0        Fall Risk    12/04/2022    8:14 AM 11/25/2022    3:24 PM 08/26/2022    9:39 AM 11/28/2021    8:27 AM 11/05/2020   10:01 AM  Fall Risk   Falls in the past year? 1 0 0 0 0  Number falls in past yr: 0 0 0 0   Injury with Fall? 0 0 0 0   Risk for fall due to : History of fall(s) No Fall Risks No Fall Risks No Fall Risks Medication side effect  Follow up Falls evaluation completed;Falls prevention discussed Falls evaluation completed Falls evaluation completed Falls prevention discussed Falls evaluation completed;Education provided;Falls prevention discussed    FALL RISK PREVENTION PERTAINING TO THE HOME:  Any stairs in or around the home? No  If so, are there any without handrails? No  Home free of loose throw rugs in walkways, pet beds, electrical cords, etc? Yes  Adequate lighting in your home to reduce risk of falls? Yes   ASSISTIVE DEVICES UTILIZED TO PREVENT FALLS:  Life alert? No  Use of a cane, walker or w/c? No  Grab bars in the bathroom? Yes  Shower chair or bench in shower? Yes  Elevated toilet seat or a handicapped toilet? Yes   TIMED UP AND GO:  Was the test performed? Yes .  Length of time to ambulate 10 feet: 4 sec.   Gait steady and fast without use of assistive device  Cognitive Function:        11/05/2020   10:02 AM 02/14/2019    2:33 PM  6CIT Screen  What Year? 0 points 0 points  What month? 0 points 0 points  What time? 0 points 0 points  Count back from 20 0 points 0 points  Months in reverse 0 points 0 points  Repeat phrase 0 points 0 points  Total Score 0 points 0 points    Immunizations Immunization History  Administered Date(s) Administered   Fluad Quad(high Dose 65+) 10/04/2019, 09/11/2021   Influenza,inj,Quad PF,6+ Mos 10/13/2016, 10/04/2017, 09/20/2018, 09/17/2020, 11/17/2022    Influenza-Unspecified 08/28/2013   Moderna Sars-Covid-2 Vaccination 02/02/2020, 03/01/2020, 06/04/2021   Pneumococcal Conjugate-13 11/13/2014   Pneumococcal Polysaccharide-23 10/16/2013   Tdap 12/22/2007   Zoster, Live 07/21/2014    TDAP status: Due, Education has been provided regarding the importance of this vaccine. Advised may receive this vaccine at local pharmacy or Health Dept. Aware to provide a copy of the vaccination record if obtained from local pharmacy or Health Dept. Verbalized acceptance and understanding.  Flu Vaccine status: Up to date  Pneumococcal vaccine status: Up to date  Covid-19 vaccine status: Completed vaccines  Qualifies for Shingles Vaccine? Yes   Zostavax completed Yes   Shingrix Completed?: No.    Education has been provided regarding the importance of this vaccine. Patient has been advised to call insurance company to determine out of pocket expense if they have not yet received this vaccine. Advised may also receive vaccine at local pharmacy or Health Dept. Verbalized acceptance and understanding.  Screening Tests Health Maintenance  Topic Date Due   Zoster Vaccines- Shingrix (1 of 2) Never done   DTaP/Tdap/Td (2 - Td or Tdap) 12/21/2017   COVID-19 Vaccine (4 - 2023-24 season) 08/21/2022   Medicare Annual Wellness (AWV)  12/05/2023   COLONOSCOPY (Pts 45-31yr Insurance coverage will need to be confirmed)  07/02/2024   Pneumonia Vaccine 75 Years old  Completed   INFLUENZA VACCINE  Completed   Hepatitis C Screening  Completed   HPV VACCINES  Aged Out    Health Maintenance  Health Maintenance Due  Topic Date Due   Zoster Vaccines- Shingrix (1 of 2) Never done   DTaP/Tdap/Td (2 - Td or Tdap) 12/21/2017   COVID-19 Vaccine (4 - 2023-24 season) 08/21/2022    Colorectal cancer screening: Type of screening: Colonoscopy. Completed 07/02/21. Repeat every 3 years  Lung Cancer Screening: (Low Dose CT Chest recommended if Age 75-80years, 30 pack-year  currently smoking OR have quit w/in 15years.) does not qualify.   Additional Screening:  Hepatitis C Screening: does qualify; Completed 08/30/20  Vision Screening: Recommended annual ophthalmology exams for early detection of glaucoma and other disorders of the eye. Is the patient up to date with their annual eye exam?  Yes  Who is the provider or what is the name of the office in which the patient attends annual eye exams? Dr.Bulakowski If pt is not established with a provider, would they like to be referred to a provider to establish care? No .   Dental Screening: Recommended annual dental exams for proper oral hygiene  Community Resource Referral / Chronic Care Management: CRR required this visit?  No   CCM required this visit?  No      Plan:     I have personally reviewed and noted the following in the patient's chart:   Medical and social history Use of alcohol, tobacco or illicit drugs  Current medications and supplements including opioid prescriptions. Patient is not currently taking opioid prescriptions. Functional ability and status Nutritional status Physical activity Advanced directives List of other  physicians Hospitalizations, surgeries, and ER visits in previous 12 months Vitals Screenings to include cognitive, depression, and falls Referrals and appointments  In addition, I have reviewed and discussed with patient certain preventive protocols, quality metrics, and best practice recommendations. A written personalized care plan for preventive services as well as general preventive health recommendations were provided to patient.     Dionisio David, LPN   [id number]   Nurse Notes: none

## 2022-12-04 NOTE — Patient Instructions (Signed)
Andrew Benson , Thank you for taking time to come for your Medicare Wellness Visit. I appreciate your ongoing commitment to your health goals. Please review the following plan we discussed and let me know if I can assist you in the future.   Screening recommendations/referrals: Colonoscopy: 07/02/21 Recommended yearly ophthalmology/optometry visit for glaucoma screening and checkup Recommended yearly dental visit for hygiene and checkup  Vaccinations: Influenza vaccine: 11/17/22 Pneumococcal vaccine: 11/13/14 Tdap vaccine: 12/22/07, due if have injury Shingles vaccine: Zostavax 07/21/14   Covid-19: 02/02/20, 03/01/20, 06/04/21  Advanced directives: no  Conditions/risks identified: none  Next appointment: Follow up in one year for your annual wellness visit. 12/10/23 @ 8:15 am in person  Preventive Care 75 Years and Older, Male Preventive care refers to lifestyle choices and visits with your health care provider that can promote health and wellness. What does preventive care include? A yearly physical exam. This is also called an annual well check. Dental exams once or twice a year. Routine eye exams. Ask your health care provider how often you should have your eyes checked. Personal lifestyle choices, including: Daily care of your teeth and gums. Regular physical activity. Eating a healthy diet. Avoiding tobacco and drug use. Limiting alcohol use. Practicing safe sex. Taking low doses of aspirin every day. Taking vitamin and mineral supplements as recommended by your health care provider. What happens during an annual well check? The services and screenings done by your health care provider during your annual well check will depend on your age, overall health, lifestyle risk factors, and family history of disease. Counseling  Your health care provider may ask you questions about your: Alcohol use. Tobacco use. Drug use. Emotional well-being. Home and relationship well-being. Sexual  activity. Eating habits. History of falls. Memory and ability to understand (cognition). Work and work Statistician. Screening  You may have the following tests or measurements: Height, weight, and BMI. Blood pressure. Lipid and cholesterol levels. These may be checked every 5 years, or more frequently if you are over 36 years old. Skin check. Lung cancer screening. You may have this screening every year starting at age 34 if you have a 30-pack-year history of smoking and currently smoke or have quit within the past 15 years. Fecal occult blood test (FOBT) of the stool. You may have this test every year starting at age 73. Flexible sigmoidoscopy or colonoscopy. You may have a sigmoidoscopy every 5 years or a colonoscopy every 10 years starting at age 10. Prostate cancer screening. Recommendations will vary depending on your family history and other risks. Hepatitis C blood test. Hepatitis B blood test. Sexually transmitted disease (STD) testing. Diabetes screening. This is done by checking your blood sugar (glucose) after you have not eaten for a while (fasting). You may have this done every 1-3 years. Abdominal aortic aneurysm (AAA) screening. You may need this if you are a current or former smoker. Osteoporosis. You may be screened starting at age 49 if you are at high risk. Talk with your health care provider about your test results, treatment options, and if necessary, the need for more tests. Vaccines  Your health care provider may recommend certain vaccines, such as: Influenza vaccine. This is recommended every year. Tetanus, diphtheria, and acellular pertussis (Tdap, Td) vaccine. You may need a Td booster every 10 years. Zoster vaccine. You may need this after age 45. Pneumococcal 13-valent conjugate (PCV13) vaccine. One dose is recommended after age 92. Pneumococcal polysaccharide (PPSV23) vaccine. One dose is recommended after age 70.  Talk to your health care provider about which  screenings and vaccines you need and how often you need them. This information is not intended to replace advice given to you by your health care provider. Make sure you discuss any questions you have with your health care provider. Document Released: 01/03/2016 Document Revised: 08/26/2016 Document Reviewed: 10/08/2015 Elsevier Interactive Patient Education  2017 [place] Prevention in the Home Falls can cause injuries. They can happen to people of all ages. There are many things you can do to make your home safe and to help prevent falls. What can I do on the outside of my home? Regularly fix the edges of walkways and driveways and fix any cracks. Remove anything that might make you trip as you walk through a door, such as a raised step or threshold. Trim any bushes or trees on the path to your home. Use bright outdoor lighting. Clear any walking paths of anything that might make someone trip, such as rocks or tools. Regularly check to see if handrails are loose or broken. Make sure that both sides of any steps have handrails. Any raised decks and porches should have guardrails on the edges. Have any leaves, snow, or ice cleared regularly. Use sand or salt on walking paths during winter. Clean up any spills in your garage right away. This includes oil or grease spills. What can I do in the bathroom? Use night lights. Install grab bars by the toilet and in the tub and shower. Do not use towel bars as grab bars. Use non-skid mats or decals in the tub or shower. If you need to sit down in the shower, use a plastic, non-slip stool. Keep the floor dry. Clean up any water that spills on the floor as soon as it happens. Remove soap buildup in the tub or shower regularly. Attach bath mats securely with double-sided non-slip rug tape. Do not have throw rugs and other things on the floor that can make you trip. What can I do in the bedroom? Use night lights. Make sure that you have a  light by your bed that is easy to reach. Do not use any sheets or blankets that are too big for your bed. They should not hang down onto the floor. Have a firm chair that has side arms. You can use this for support while you get dressed. Do not have throw rugs and other things on the floor that can make you trip. What can I do in the kitchen? Clean up any spills right away. Avoid walking on wet floors. Keep items that you use a lot in easy-to-reach places. If you need to reach something above you, use a strong step stool that has a grab bar. Keep electrical cords out of the way. Do not use floor polish or wax that makes floors slippery. If you must use wax, use non-skid floor wax. Do not have throw rugs and other things on the floor that can make you trip. What can I do with my stairs? Do not leave any items on the stairs. Make sure that there are handrails on both sides of the stairs and use them. Fix handrails that are broken or loose. Make sure that handrails are as long as the stairways. Check any carpeting to make sure that it is firmly attached to the stairs. Fix any carpet that is loose or worn. Avoid having throw rugs at the top or bottom of the stairs. If you do have throw  rugs, attach them to the floor with carpet tape. Make sure that you have a light switch at the top of the stairs and the bottom of the stairs. If you do not have them, ask someone to add them for you. What else can I do to help prevent falls? Wear shoes that: Do not have high heels. Have rubber bottoms. Are comfortable and fit you well. Are closed at the toe. Do not wear sandals. If you use a stepladder: Make sure that it is fully opened. Do not climb a closed stepladder. Make sure that both sides of the stepladder are locked into place. Ask someone to hold it for you, if possible. Clearly mark and make sure that you can see: Any grab bars or handrails. First and last steps. Where the edge of each step  is. Use tools that help you move around (mobility aids) if they are needed. These include: Canes. Walkers. Scooters. Crutches. Turn on the lights when you go into a dark area. Replace any light bulbs as soon as they burn out. Set up your furniture so you have a clear path. Avoid moving your furniture around. If any of your floors are uneven, fix them. If there are any pets around you, be aware of where they are. Review your medicines with your doctor. Some medicines can make you feel dizzy. This can increase your chance of falling. Ask your doctor what other things that you can do to help prevent falls. This information is not intended to replace advice given to you by your health care provider. Make sure you discuss any questions you have with your health care provider. Document Released: 10/03/2009 Document Revised: 05/14/2016 Document Reviewed: 01/11/2015 Elsevier Interactive Patient Education  2017 [employer].

## 2022-12-20 ENCOUNTER — Other Ambulatory Visit: Payer: Self-pay | Admitting: Cardiovascular Disease

## 2022-12-20 DIAGNOSIS — I10 Essential (primary) hypertension: Secondary | ICD-10-CM

## 2023-01-01 ENCOUNTER — Encounter: Payer: Self-pay | Admitting: Nurse Practitioner

## 2023-01-01 ENCOUNTER — Ambulatory Visit: Payer: Medicare Other | Attending: Nurse Practitioner | Admitting: Nurse Practitioner

## 2023-01-01 VITALS — BP 110/72 | HR 50 | Ht 71.0 in | Wt 181.5 lb

## 2023-01-01 DIAGNOSIS — R55 Syncope and collapse: Secondary | ICD-10-CM

## 2023-01-01 DIAGNOSIS — E782 Mixed hyperlipidemia: Secondary | ICD-10-CM | POA: Diagnosis not present

## 2023-01-01 DIAGNOSIS — R0609 Other forms of dyspnea: Secondary | ICD-10-CM

## 2023-01-01 DIAGNOSIS — I480 Paroxysmal atrial fibrillation: Secondary | ICD-10-CM | POA: Diagnosis not present

## 2023-01-01 DIAGNOSIS — R002 Palpitations: Secondary | ICD-10-CM | POA: Diagnosis not present

## 2023-01-01 DIAGNOSIS — N182 Chronic kidney disease, stage 2 (mild): Secondary | ICD-10-CM | POA: Diagnosis not present

## 2023-01-01 DIAGNOSIS — I10 Essential (primary) hypertension: Secondary | ICD-10-CM

## 2023-01-01 NOTE — Progress Notes (Signed)
Office Visit    Patient Name: Andrew Benson Date of Encounter: 01/01/2023  Primary Care Provider:  Olin Hauser, DO Primary Cardiologist:  Ida Rogue, MD  Chief Complaint    77 y/o ? w/ a h/o PAF, HTN, HL, and CKD II, who presents for f/u related to palpitations and presyncope after recent event monitoring.  Past Medical History    Past Medical History:  Diagnosis Date   Asthma    CKD (chronic kidney disease), stage II    Essential hypertension    Hyperlipidemia    NSVT (nonsustained ventricular tachycardia) (HCC)    PAF (paroxysmal atrial fibrillation) (HCC)    a. CHA2DS2VASc = 3;  b. takes amio/eliquis/metoprolol prn for PAF.   Palpitations    a. 11/2022 Zio: Predominantly sinus rhythm at 65 (40-255).  4 runs of NSVT, longest 8 beats (162), fastest 255 (6 beats). 8 SVT runs, longests 15 beats (127), fastest 143 5 beats). Rare PACs/PVCs.   PSVT (paroxysmal supraventricular tachycardia)    TMJ locking    Past Surgical History:  Procedure Laterality Date   COLONOSCOPY WITH PROPOFOL N/A 12/29/2017   Procedure: COLONOSCOPY WITH PROPOFOL;  Surgeon: Manya Silvas, MD;  Location: [facility] ENDOSCOPY;  Service: Endoscopy;  Laterality: N/A;   COLONOSCOPY WITH PROPOFOL N/A 07/02/2021   Procedure: COLONOSCOPY WITH PROPOFOL;  Surgeon: Toledo, [name], MD;  Location: [facility] ENDOSCOPY;  Service: Gastroenterology;  Laterality: N/A;   FINGER SURGERY     left index finger   INGUINAL HERNIA REPAIR Right 2007   INGUINAL HERNIA REPAIR Left 2008   done x 2   KNEE SURGERY     left     Allergies  Allergies  Allergen Reactions   Penicillins     Rash Rash     History of Present Illness    77 year old male with above past medical history including paroxysmal atrial fibrillation, hypertension, hyperlipidemia, and stage II chronic kidney disease.  He has a history of atrial fibrillation dating back approximately 25 years.  His most recent profound episode occurred in  2019, while drinking coffee.  He uses as needed metoprolol and amiodarone, and has been anticoagulated with Eliquis in the setting of a CHA2DS2-VASc of 3-4 (hypertension, age x 2, chronic small vessel disease noted on brain MRI previously).  Mr. Russman was last seen in cardiology clinic in early December 2023, at which time he reported intermittent palpitations lasting a few seconds and associated with presyncope.  He had not experienced any syncope.  Lab work was unremarkable.  I asked him to start taking metoprolol 12.5 mg twice daily.  A 14-day ZIO AT was placed and though this showed brief runs of nonsustained VT and PSVT as outlined in the past medical history, there were no triggered events.  Mr. Bergen reports today that he did not realize or forgot that he was supposed to trigger the monitor.  He had 3-4 episodes of brief presyncope while wearing the monitor though we are not able to link his symptoms to findings at this time.  He has been taking his metoprolol at 25 mg twice daily.  After discussing his monitor results and suggesting that stress testing would be appropriate in the setting of nonsustained VT, he was grateful and reported that he actually has been having a reduction in exercise tolerance and dyspnea on exertion.  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  He has noticed that his  activity tolerance has reduced and he becomes short of breath with heavier levels of exertion.  He is typically okay walking on flat surfaces.  He does not experience any exertional chest pain.  Sometimes he has a "twinge" of lower left chest discomfort that might occur at rest, lasting a few minutes, and resolving spontaneously.  He denies palpitations, PND, orthopnea, syncope, edema, or early satiety.  Home Medications    Current Outpatient Medications  Medication Sig Dispense Refill   amiodarone (PACERONE) 200 MG tablet TAKE 1 TABLET (200 MG TOTAL)  BY MOUTH DAILY AS NEEDED (TAKE FOR A FIB). 90 tablet 3   apixaban (ELIQUIS) 5 MG TABS tablet Take 1 tablet (5 mg total) by mouth 2 (two) times daily. 60 tablet 6   fluticasone (FLONASE) 50 MCG/ACT nasal spray Place 2 sprays into both nostrils daily. Use for 4-6 weeks then stop and use seasonally or as needed. 16 g 3   lisinopril (ZESTRIL) 2.5 MG tablet TAKE 1 TABLET BY MOUTH EVERY DAY 90 tablet 1   metoprolol tartrate (LOPRESSOR) 25 MG tablet Take 0.5 tablets (12.5 mg total) by mouth 2 (two) times daily. 90 tablet 3   Multiple Vitamin (MULTIVITAMIN) capsule Take 1 capsule by mouth daily.     rosuvastatin (CRESTOR) 20 MG tablet Take 1 tablet (20 mg total) by mouth daily. 90 tablet 1   tadalafil (CIALIS) 20 MG tablet Take 1 tablet (20 mg total) by mouth daily as needed for erectile dysfunction. 30 tablet 0   tamsulosin (FLOMAX) 0.4 MG CAPS capsule TAKE 1 CAPSULE BY MOUTH EVERY DAY 90 capsule 3   No current facility-administered medications for this visit.     Review of Systems    Reduction in exercise tolerance/some dyspnea on exertion at heavier levels of activity.  Intermittent brief episodes of chest pain but not necessarily tied to exertion.  Brief episodes of lightheadedness though less frequent in nature.  He denies palpitations, PND, orthopnea, syncope, edema, or early satiety.  All other systems reviewed and are otherwise negative except as noted above.    Physical Exam    VS:  BP 110/72 (BP Location: Left Arm, Patient Position: Sitting, Cuff Size: Normal)   Pulse (!) 50   Ht '5\' 11"'$  (1.803 m)   Wt 181 lb 8 oz (82.3 kg)   SpO2 96%   BMI 25.31 kg/m  , BMI Body mass index is 25.31 kg/m.     GEN: Well nourished, well developed, in no acute distress. HEENT: normal. Neck: Supple, no JVD, carotid bruits, or masses. Cardiac: RRR, no murmurs, rubs, or gallops. No clubbing, cyanosis, edema.  Radials 2+/PT 2+ and equal bilaterally.  Respiratory:  Respirations regular and unlabored, clear  to auscultation bilaterally. GI: Soft, nontender, nondistended, BS + x 4. MS: no deformity or atrophy. Skin: warm and dry, no rash. Neuro:  Strength and sensation are intact. Psych: Normal affect.  Accessory Clinical Findings    ECG personally reviewed by me today -sinus bradycardia, 50, nonspecific T abnormalities- no acute changes.  Lab Results  Component Value Date   WBC 4.3 11/27/2022   HGB 15.0 11/27/2022   HCT 43.9 11/27/2022   MCV 88.9 11/27/2022   PLT 135 (L) 11/27/2022   Lab Results  Component Value Date   CREATININE 1.30 (H) 11/27/2022   BUN 18 11/27/2022   NA 143 11/27/2022   K 4.2 11/27/2022   CL 110 11/27/2022   CO2 30 11/27/2022   Lab Results  Component Value Date  ALT 18 11/27/2022   AST 24 11/27/2022   ALKPHOS 74 11/27/2022   BILITOT 0.9 11/27/2022   Lab Results  Component Value Date   CHOL 170 03/23/2022   HDL 59 03/23/2022   LDLCALC 98 03/23/2022   TRIG 67 03/23/2022   CHOLHDL 2.9 03/23/2022    Lab Results  Component Value Date   HGBA1C 5.3 08/30/2020    Assessment & Plan    1.  Dyspnea on exertion: Over the past several months, patient has noticed a decline in activity tolerance with dyspnea exertion during higher levels of activity.  He works at a [employer] and carries heavy boxes and has noticed his ability to do this for long periods of time has reduced.  He does not experience exertional chest pain.  He sometimes notices a vague left lower chest discomfort, typically at rest, lasting a few minutes, resolving spontaneously.  We discussed options for evaluation today.  In light of nonsustained VT noted on recent monitoring and reduced exercise tolerance, we agreed to pursue an exercise Myoview.  We did discuss coronary CTA however, given stage II chronic kidney disease with baseline creatinine around 1.3, we opted for stress testing instead.  If stress testing normal, will pursue echo.  2.  Presyncope/PSVT/nonsustained VT: Patient was seen  in December with complaints of intermittent and brief episodes of presyncope.  At that time, I asked him to take metoprolol 12.5 mg twice daily with high suspicion for arrhythmia.  Since his last visit, has been taking metoprolol 25 mg twice daily as he did not think he could cut it in half.  He has had a significant reduction in his occurrence of presyncope.  A Zio monitor was worn and did show several runs of nonsustained VT and also PSVT, though all episodes were brief.  He did not trigger any events as he did not realize he was supposed to.  I have asked him to continue his metoprolol 25 mg twice daily.  Plan for stress testing as outlined above.  Previous lab work was unremarkable.  3.  Paroxysmal atrial fibrillation: Maintaining sinus rhythm on beta-blocker therapy.  He has amiodarone to be used as needed.  He did not have any atrial fibrillation on recent monitoring.  He remains anticoagulated on Eliquis therapy in the setting of a CHA2DS2-VASc of 3-4.  4.  Essential hypertension: Stable.  5.  Hyperlipidemia: Remains on rosuvastatin therapy with an LDL of 98 last year.  6.  Stage II chronic kidney disease: Creatinine was 1.3 in December.  Avoiding nephrotoxic agents.  7.  Disposition: Follow-up stress testing.  Follow-up in clinic in 1 month or sooner if necessary.   Murray Hodgkins, NP 01/01/2023, 8:09 AM

## 2023-01-01 NOTE — Patient Instructions (Addendum)
Medication Instructions:  - Your physician recommends that you continue on your current medications as directed. Please refer to the Current Medication list given to you today.   *If you need a refill on your cardiac medications before your next appointment, please call your pharmacy*   Lab Work: None  If you have labs (blood work) drawn today and your tests are completely normal, you will receive your results only by: [place] (if you have MyChart) OR A paper copy in the mail If you have any lab test that is abnormal or we need to change your treatment, we will call you to review the results.   Testing/Procedures:  1) Exercise Myoview (Cardiac Nuclear Scan) - Your physician has requested that you have an exercise stress myoview.  [place]  Your caregiver has ordered a Stress Test with nuclear imaging. The purpose of this test is to evaluate the blood supply to your heart muscle. This procedure is referred to as a "Non-Invasive Stress Test." This is because other than having an IV started in your vein, nothing is inserted or "invades" your body. Cardiac stress tests are done to find areas of poor blood flow to the heart by determining the extent of coronary artery disease (CAD). Some patients exercise on a treadmill, which naturally increases the blood flow to your heart, while others who are  unable to walk on a treadmill due to physical limitations have a pharmacologic/chemical stress agent called Lexiscan . This medicine will mimic walking on a treadmill by temporarily increasing your coronary blood flow.   Please note: these test may take anywhere between 2-4 hours to complete  PLEASE REPORT TO [facility] AT THE FIRST DESK WILL DIRECT YOU WHERE TO GO  Date of Procedure:_____________________________________  Arrival Time for Procedure:______________________________  Instructions regarding medication:    _xx___:  Hold betablocker(s)  (METOPROLOL) the night before procedure and morning of procedure   _xx___:  You may take all of your other regular morning medications the day of your test with enough water to get them down safely.  PLEASE NOTIFY THE OFFICE AT LEAST 73 HOURS IN ADVANCE IF YOU ARE UNABLE TO KEEP YOUR APPOINTMENT.  [phone] AND  PLEASE NOTIFY NUCLEAR MEDICINE AT [facility] AT LEAST 24 HOURS IN ADVANCE IF YOU ARE UNABLE TO KEEP YOUR APPOINTMENT. [phone]  How to prepare for your Myoview test:  Do not eat or drink after midnight No caffeine for 24 hours prior to test No smoking 24 hours prior to test. Your medication may be taken with water.  If your doctor stopped a medication because of this test, do not take that medication. Ladies, please do not wear dresses.  Skirts or pants are appropriate. Please wear a short sleeve shirt. No perfume, cologne or lotion. Wear comfortable walking shoes. No heels!    Follow-Up: At [facility], you and your health needs are our priority.  As part of our continuing mission to provide you with exceptional heart care, we have created designated Provider Care Teams.  These Care Teams include your primary Cardiologist (physician) and Advanced Practice Providers (APPs -  Physician Assistants and Nurse Practitioners) who all work together to provide you with the care you need, when you need it.  Your next appointment:   1 month(s)  Provider:   Ida Rogue, MD or Murray Hodgkins, NP      Cardiac Nuclear Scan A cardiac nuclear scan is a test that is done to check the  flow of blood to your heart. It is done when you are resting and when you are exercising. The test looks for problems such as: Not enough blood reaching a portion of the heart. The heart muscle not working as it should. You may need this test if you have: Heart disease. Lab results that are not normal. Had heart surgery or a balloon procedure to open up blocked arteries (angioplasty) or a  small mesh tube (stent). Chest pain. Shortness of breath. Had a heart attack. In this test, a special dye (tracer) is put into your bloodstream. The tracer will travel to your heart. A camera will then take pictures of your heart to see how the tracer moves through your heart. This test is usually done at a hospital and takes 2-4 hours. Tell a doctor about: Any allergies you have. All medicines you are taking, including vitamins, herbs, eye drops, creams, and over-the-counter medicines. Any bleeding problems you have. Any surgeries you have had. Any medical conditions you have. Whether you are pregnant or may be pregnant. Any history of asthma or long-term (chronic) lung disease. Any history of heart rhythm disorders or heart valve conditions. What are the risks? Your doctor will talk with you about risks. These may include: Serious chest pain and heart attack. This is only a risk if the stress portion of the test is done. Fast or uneven heartbeats (palpitations). A feeling of warmth in your chest. This feeling usually does not last long. Allergic reaction to the tracer. Shortness of breath or trouble breathing. What happens before the test? Ask your doctor about changing or stopping your normal medicines. Follow instructions from your doctor about what you cannot eat or drink. Remove your jewelry on the day of the test. Ask your doctor if you need to avoid nicotine or caffeine. What happens during the test? An IV tube will be inserted into one of your veins. Your doctor will give you a small amount of tracer through the IV tube. You will wait for 20-40 minutes while the tracer moves through your bloodstream. Your heart will be monitored with an electrocardiogram (ECG). You will lie down on an exam table. Pictures of your heart will be taken for about 15-20 minutes. You may also have a stress test. For this test, one of these things may be done: You will be asked to exercise on a  treadmill or a stationary bike. You will be given medicines that will make your heart work harder. This is done if you are unable to exercise. When blood flow to your heart has peaked, a tracer will again be given through the IV tube. After 20-40 minutes, you will get back on the exam table. More pictures will be taken of your heart. Depending on the tracer that is used, more pictures may need to be taken 3-4 hours later. Your IV tube will be removed when the test is over. The test may vary among doctors and hospitals. What happens after the test? Ask your doctor: Whether you can return to your normal schedule, including diet, activities, travel, and medicines. Whether you should drink more fluids. This will help to remove the tracer from your body. Ask your doctor, or the department that is doing the test: When will my results be ready? How will I get my results? What are my treatment options? What other tests do I need? What are my next steps? This information is not intended to replace advice given to you by your health care  provider. Make sure you discuss any questions you have with your health care provider. Document Revised: 05/05/2022 Document Reviewed: 05/05/2022 Elsevier Patient Education  [place].

## 2023-01-05 ENCOUNTER — Emergency Department
Admission: EM | Admit: 2023-01-05 | Discharge: 2023-01-05 | Disposition: A | Discharge status: Home / Self Care | Payer: Medicare Other | Attending: Emergency Medicine | Admitting: Emergency Medicine

## 2023-01-05 ENCOUNTER — Emergency Department: Payer: Medicare Other

## 2023-01-05 DIAGNOSIS — Z7901 Long term (current) use of anticoagulants: Secondary | ICD-10-CM | POA: Diagnosis not present

## 2023-01-05 DIAGNOSIS — Z20822 Contact with and (suspected) exposure to covid-19: Secondary | ICD-10-CM | POA: Insufficient documentation

## 2023-01-05 DIAGNOSIS — Z79899 Other long term (current) drug therapy: Secondary | ICD-10-CM | POA: Insufficient documentation

## 2023-01-05 DIAGNOSIS — R002 Palpitations: Secondary | ICD-10-CM | POA: Diagnosis present

## 2023-01-05 DIAGNOSIS — I10 Essential (primary) hypertension: Secondary | ICD-10-CM

## 2023-01-05 DIAGNOSIS — J45909 Unspecified asthma, uncomplicated: Secondary | ICD-10-CM | POA: Insufficient documentation

## 2023-01-05 DIAGNOSIS — N182 Chronic kidney disease, stage 2 (mild): Secondary | ICD-10-CM | POA: Insufficient documentation

## 2023-01-05 DIAGNOSIS — I129 Hypertensive chronic kidney disease with stage 1 through stage 4 chronic kidney disease, or unspecified chronic kidney disease: Secondary | ICD-10-CM | POA: Diagnosis not present

## 2023-01-05 DIAGNOSIS — R509 Fever, unspecified: Secondary | ICD-10-CM | POA: Insufficient documentation

## 2023-01-05 LAB — CBC WITH DIFFERENTIAL/PLATELET
Abs Immature Granulocytes: 0.01 10*3/uL (ref 0.00–0.07)
Basophils Absolute: 0 10*3/uL (ref 0.0–0.1)
Basophils Relative: 1 %
Eosinophils Absolute: 0.1 10*3/uL (ref 0.0–0.5)
Eosinophils Relative: 2 %
HCT: 43.1 % (ref 39.0–52.0)
Hemoglobin: 14.9 g/dL (ref 13.0–17.0)
Immature Granulocytes: 0 %
Lymphocytes Relative: 39 %
Lymphs Abs: 1.9 10*3/uL (ref 0.7–4.0)
MCH: 30.3 pg (ref 26.0–34.0)
MCHC: 34.6 g/dL (ref 30.0–36.0)
MCV: 87.8 fL (ref 80.0–100.0)
Monocytes Absolute: 0.4 10*3/uL (ref 0.1–1.0)
Monocytes Relative: 9 %
Neutro Abs: 2.4 10*3/uL (ref 1.7–7.7)
Neutrophils Relative %: 49 %
Platelets: 134 10*3/uL — ABNORMAL LOW (ref 150–400)
RBC: 4.91 MIL/uL (ref 4.22–5.81)
RDW: 14.4 % (ref 11.5–15.5)
WBC: 4.9 10*3/uL (ref 4.0–10.5)
nRBC: 0 % (ref 0.0–0.2)

## 2023-01-05 LAB — URINALYSIS, ROUTINE W REFLEX MICROSCOPIC
Bilirubin Urine: NEGATIVE
Glucose, UA: NEGATIVE mg/dL
Hgb urine dipstick: NEGATIVE
Ketones, ur: NEGATIVE mg/dL
Leukocytes,Ua: NEGATIVE
Nitrite: NEGATIVE
Protein, ur: NEGATIVE mg/dL
Specific Gravity, Urine: 1.024 (ref 1.005–1.030)
pH: 5 (ref 5.0–8.0)

## 2023-01-05 LAB — COMPREHENSIVE METABOLIC PANEL
ALT: 17 U/L (ref 0–44)
AST: 25 U/L (ref 15–41)
Albumin: 3.9 g/dL (ref 3.5–5.0)
Alkaline Phosphatase: 77 U/L (ref 38–126)
Anion gap: 5 (ref 5–15)
BUN: 21 mg/dL (ref 8–23)
CO2: 27 mmol/L (ref 22–32)
Calcium: 9 mg/dL (ref 8.9–10.3)
Chloride: 108 mmol/L (ref 98–111)
Creatinine, Ser: 1.2 mg/dL (ref 0.61–1.24)
GFR, Estimated: >60 mL/min (ref >60)
Glucose, Bld: 111 mg/dL — ABNORMAL HIGH (ref 70–99)
Potassium: 3.5 mmol/L (ref 3.5–5.1)
Sodium: 140 mmol/L (ref 135–145)
Total Bilirubin: 0.9 mg/dL (ref 0.3–1.2)
Total Protein: 7 g/dL (ref 6.5–8.1)

## 2023-01-05 LAB — RESP PANEL BY RT-PCR (RSV, FLU A&B, COVID)  RVPGX2
Influenza A by PCR: NEGATIVE
Influenza B by PCR: NEGATIVE
Resp Syncytial Virus by PCR: NEGATIVE
SARS Coronavirus 2 by RT PCR: NEGATIVE

## 2023-01-05 LAB — TROPONIN I (HIGH SENSITIVITY)
Troponin I (High Sensitivity): 8 ng/L (ref <18)
Troponin I (High Sensitivity): 8 ng/L (ref <18)

## 2023-01-05 LAB — TSH: TSH: 0.821 u[IU]/mL (ref 0.350–4.500)

## 2023-01-05 LAB — MAGNESIUM: Magnesium: 2.2 mg/dL (ref 1.7–2.4)

## 2023-01-05 LAB — T4, FREE: Free T4: 0.81 ng/dL (ref 0.61–1.12)

## 2023-01-05 NOTE — ED Triage Notes (Signed)
Ambulatory to triage with c/o tachycardia at home. Hx of Afib. Reports not missing any doses of medication. Denies chest pain or shortness of breath during episode. States he did have episode of shivering but denies any fever or covid/flu sx.

## 2023-01-05 NOTE — ED Provider Notes (Signed)
[facility] Provider Note    Event Date/Time   First MD Initiated Contact with Patient 01/05/23 [phone]     (approximate)   History   Tachycardia   HPI  Andrew Benson is a 77 y.o. male with history of hypertension, hyperlipidemia, paroxysmal A-fib on Eliquis, nonsustained ventricular tachycardia, chronic kidney disease who presents to the emergency department his wife for complaints of feeling like his heart was pounding while sitting down at rest tonight.  States he checked his heart rate and blood pressure and his heart rate was in the 60s but his blood pressure was elevated.  The highest he saw it was in the 190s/100s.  He states now he is feeling better.  No chest discomfort currently, shortness of breath.  He did have chills when this happened but no recent cough, congestion, sore throat, dysuria, hematuria, vomiting or diarrhea.   History provided by patient and wife.    Past Medical History:  Diagnosis Date   Asthma    CKD (chronic kidney disease), stage II    Essential hypertension    Hyperlipidemia    NSVT (nonsustained ventricular tachycardia) (HCC)    PAF (paroxysmal atrial fibrillation) (HCC)    a. CHA2DS2VASc = 3;  b. takes amio/eliquis/metoprolol prn for PAF.   Palpitations    a. 11/2022 Zio: Predominantly sinus rhythm at 65 (40-255).  4 runs of NSVT, longest 8 beats (162), fastest 255 (6 beats). 8 SVT runs, longests 15 beats (127), fastest 143 5 beats). Rare PACs/PVCs.   PSVT (paroxysmal supraventricular tachycardia)    TMJ locking     Past Surgical History:  Procedure Laterality Date   COLONOSCOPY WITH PROPOFOL N/A 12/29/2017   Procedure: COLONOSCOPY WITH PROPOFOL;  Surgeon: Manya Silvas, MD;  Location: [facility] ENDOSCOPY;  Service: Endoscopy;  Laterality: N/A;   COLONOSCOPY WITH PROPOFOL N/A 07/02/2021   Procedure: COLONOSCOPY WITH PROPOFOL;  Surgeon: Toledo, [name], MD;  Location: [facility] ENDOSCOPY;  Service: Gastroenterology;   Laterality: N/A;   FINGER SURGERY     left index finger   INGUINAL HERNIA REPAIR Right 2007   INGUINAL HERNIA REPAIR Left 2008   done x 2   KNEE SURGERY     left     MEDICATIONS:  Prior to Admission medications   Medication Sig Start Date End Date Taking? Authorizing Provider  amiodarone (PACERONE) 200 MG tablet TAKE 1 TABLET (200 MG TOTAL) BY MOUTH DAILY AS NEEDED (TAKE FOR A FIB). 03/26/22   Minna Merritts, MD  apixaban (ELIQUIS) 5 MG TABS tablet Take 1 tablet (5 mg total) by mouth 2 (two) times daily. 11/27/22   Theora Gianotti, NP  fluticasone (FLONASE) 50 MCG/ACT nasal spray Place 2 sprays into both nostrils daily. Use for 4-6 weeks then stop and use seasonally or as needed. 05/28/22   Karamalegos, Devonne Doughty, DO  lisinopril (ZESTRIL) 2.5 MG tablet TAKE 1 TABLET BY MOUTH EVERY DAY 12/22/22   Minna Merritts, MD  metoprolol tartrate (LOPRESSOR) 25 MG tablet Take 1 tablet (25 mg) by mouth twice daily    [provider]  Multiple Vitamin (MULTIVITAMIN) capsule Take 1 capsule by mouth daily.    [provider]  rosuvastatin (CRESTOR) 20 MG tablet Take 1 tablet (20 mg total) by mouth daily. 10/01/22   Minna Merritts, MD  tadalafil (CIALIS) 20 MG tablet Take 1 tablet (20 mg total) by mouth daily as needed for erectile dysfunction. 04/15/22   Hollice Espy, MD  tamsulosin [facility]) 0.4  MG CAPS capsule TAKE 1 CAPSULE BY MOUTH EVERY DAY 06/25/22   Olin Hauser, DO    Physical Exam   Triage Vital Signs: ED Triage Vitals  Enc Vitals Group     BP 01/05/23 0030 (!) 149/90     Pulse Rate 01/05/23 0027 63     Resp 01/05/23 0027 18     Temp 01/05/23 0027 97.9 F (36.6 C)     Temp src --      SpO2 01/05/23 0027 99 %     Weight 01/05/23 0028 175 lb (79.4 kg)     Height 01/05/23 0028 '5\' 11"'$  (1.803 m)     Head Circumference --      Peak Flow --      Pain Score 01/05/23 0028 0     Pain Loc --      Pain Edu? --      Excl. in [place]? --     Most recent  vital signs: Vitals:   01/05/23 0300 01/05/23 0400  BP: 130/83 130/79  Pulse: (!) 48 64  Resp: 14 16  Temp:    SpO2: 95% 99%    CONSTITUTIONAL: Alert and oriented and responds appropriately to questions. Well-appearing; well-nourished, extremely pleasant HEAD: Normocephalic, atraumatic EYES: Conjunctivae clear, pupils appear equal, sclera nonicteric ENT: normal nose; moist mucous membranes NECK: Supple, normal ROM CARD: RRR; S1 and S2 appreciated; no murmurs, no clicks, no rubs, no gallops RESP: Normal chest excursion without splinting or tachypnea; breath sounds clear and equal bilaterally; no wheezes, no rhonchi, no rales, no hypoxia or respiratory distress, speaking full sentences ABD/GI: Normal bowel sounds; non-distended; soft, non-tender, no rebound, no guarding, no peritoneal signs BACK: The back appears normal EXT: Normal ROM in all joints; no deformity noted, no edema; no cyanosis, no calf tenderness or calf swelling SKIN: Normal color for age and race; warm; no rash on exposed skin NEURO: Moves all extremities equally, normal speech PSYCH: The patient's mood and manner are appropriate.   ED Results / Procedures / Treatments   LABS: (all labs ordered are listed, but only abnormal results are displayed) Labs Reviewed  CBC WITH DIFFERENTIAL/PLATELET - Abnormal; Notable for the following components:      Result Value   Platelets 134 (*)    All other components within normal limits  COMPREHENSIVE METABOLIC PANEL - Abnormal; Notable for the following components:   Glucose, Bld 111 (*)    All other components within normal limits  URINALYSIS, ROUTINE W REFLEX MICROSCOPIC - Abnormal; Notable for the following components:   Color, Urine YELLOW (*)    APPearance CLEAR (*)    All other components within normal limits  RESP PANEL BY RT-PCR (RSV, FLU A&B, COVID)  RVPGX2  MAGNESIUM  TSH  T4, FREE  TROPONIN I (HIGH SENSITIVITY)  TROPONIN I (HIGH SENSITIVITY)     EKG:   EKG Interpretation  Date/Time:  Tuesday January 05 2023 00:31:12 EST Ventricular Rate:  70 PR Interval:  138 QRS Duration: 94 QT Interval:  388 QTC Calculation: 419 R Axis:   21 Text Interpretation: Sinus rhythm with occasional Premature ventricular complexes Inferior infarct , age undetermined Cannot rule out Anterior infarct , age undetermined Abnormal ECG A fib has resolved Confirmed by Pryor Curia [phone]) on 01/05/2023 1:52:52 AM         RADIOLOGY: My personal review and interpretation of imaging: Chest x-ray clear.  I have personally reviewed all radiology reports.   DG Chest Portable 1 View  Result Date:  01/05/2023 CLINICAL DATA:  Chest pain EXAM: PORTABLE CHEST 1 VIEW COMPARISON:  10/26/2013 FINDINGS: The heart size and mediastinal contours are within normal limits. Both lungs are clear. The visualized skeletal structures are unremarkable. IMPRESSION: No active disease. Electronically Signed   By: Inez Catalina M.D.   On: 01/05/2023 01:16     PROCEDURES:  Critical Care performed: No     .1-3 Lead EKG Interpretation  Performed by: Carry Ortez, Delice Bison, DO Authorized by: Deya Bigos, Delice Bison, DO     Interpretation: normal     ECG rate:  55   ECG rate assessment: bradycardic     Rhythm: sinus tachycardia     Ectopy: PVCs     Conduction: normal       IMPRESSION / MDM / ASSESSMENT AND PLAN / ED COURSE  I reviewed the triage vital signs and the nursing notes.    Patient here with symptoms of feeling like his heart was pounding and hypertension.  Now asymptomatic.  The patient is on the cardiac monitor to evaluate for evidence of arrhythmia and/or significant heart rate changes.   DIFFERENTIAL DIAGNOSIS (includes but not limited to):   A-fib with RVR, palpitations, anemia, electrolyte derangement, ACS, doubt PE, dissection, CHF exacerbation   Patient's presentation is most consistent with acute presentation with potential threat to life or bodily function.   PLAN:  Workup initiated from triage.  Normal hemoglobin, renal function, electrolytes.  First troponin negative.  Thyroid function normal.  Chest x-ray reviewed and interpreted by myself and the radiologist and is unremarkable.  Patient in sinus rhythm with occasional PVCs.  No ischemia or other arrhythmia noted.  Blood pressure improved to the 150s/80s.  Given his episode of chills, will obtain COVID swab, flu swab, urinalysis to rule out infection.  Will continue to monitor on cardiac monitoring.  Patient and wife comfortable with this plan.   MEDICATIONS GIVEN IN ED: Medications - No data to display   ED COURSE: Blood pressures have improved without intervention.  Heart rate has been normal and no events no cardiac monitoring.  Second troponin negative.  Urine shows no sign of infection or ketonuria.  COVID, flu, RSV negative.  I feel he is safe for discharge home with follow-up with his PCP, cardiologist.   At this time, I do not feel there is any life-threatening condition present. I reviewed all nursing notes, vitals, pertinent previous records.  All lab and urine results, EKGs, imaging ordered have been independently reviewed and interpreted by myself.  I reviewed all available radiology reports from any imaging ordered this visit.  Based on my assessment, I feel the patient is safe to be discharged home without further emergent workup and can continue workup as an outpatient as needed. Discussed all findings, treatment plan as well as usual and customary return precautions.  They verbalize understanding and are comfortable with this plan.  Outpatient follow-up has been provided as needed.  All questions have been answered.    CONSULTS:  none   OUTSIDE RECORDS REVIEWED: Reviewed last cardiology note on 01/01/2023.       FINAL CLINICAL IMPRESSION(S) / ED DIAGNOSES   Final diagnoses:  Palpitations  Hypertension, unspecified type     Rx / DC Orders   ED Discharge Orders     None         Note:  This document was prepared using Dragon voice recognition software and may include unintentional dictation errors.   Jaylea Plourde, Delice Bison, DO 01/05/23 [phone]

## 2023-01-08 ENCOUNTER — Encounter
Admission: RE | Admit: 2023-01-08 | Discharge: 2023-01-08 | Disposition: A | Discharge status: Home / Self Care | Payer: Medicare Other | Source: Ambulatory Visit | Attending: Nurse Practitioner | Admitting: Nurse Practitioner

## 2023-01-08 DIAGNOSIS — R0609 Other forms of dyspnea: Secondary | ICD-10-CM | POA: Diagnosis present

## 2023-01-08 MED ORDER — TECHNETIUM TC 99M TETROFOSMIN IV KIT
30.5400 | PACK | Freq: Once | INTRAVENOUS | Status: AC | PRN
  Administered 2023-01-08: 30.54 via INTRAVENOUS

## 2023-01-08 MED ORDER — TECHNETIUM TC 99M TETROFOSMIN IV KIT
10.7000 | PACK | Freq: Once | INTRAVENOUS | Status: AC | PRN
  Administered 2023-01-08: 10.7 via INTRAVENOUS

## 2023-01-08 MED ORDER — REGADENOSON 0.4 MG/5ML IV SOLN
0.4000 mg | Freq: Once | INTRAVENOUS | Status: AC
  Administered 2023-01-08: 0.4 mg via INTRAVENOUS
  Filled 2023-01-08: qty 5

## 2023-01-10 LAB — NM MYOCAR MULTI W/SPECT W/WALL MOTION / EF
Angina Index: 0
Duke Treadmill Score: 5
Estimated workload: 6.4
Exercise duration (min): 4 min
Exercise duration (sec): 33 s
LV dias vol: 97 mL (ref 62–150)
LV sys vol: 50 mL
Nuc Stress EF: 48 %
Peak HR: 125 {beats}/min
Percent HR: 86 %
Rest HR: 50 {beats}/min
Rest Nuclear Isotope Dose: 10.7 mCi
SDS: 0
SRS: 6
SSS: 0
ST Depression (mm): 0 mm
Stress Nuclear Isotope Dose: 30.5 mCi
TID: 0.88

## 2023-01-11 ENCOUNTER — Ambulatory Visit: Payer: Medicare Other | Attending: Nurse Practitioner

## 2023-01-11 ENCOUNTER — Other Ambulatory Visit: Payer: Self-pay

## 2023-01-11 DIAGNOSIS — R0609 Other forms of dyspnea: Secondary | ICD-10-CM

## 2023-01-11 LAB — ECHOCARDIOGRAM COMPLETE
AR max vel: 3.81 cm2
AV Area VTI: 3.95 cm2
AV Area mean vel: 3.43 cm2
AV Mean grad: 1 mmHg
AV Peak grad: 1.9 mmHg
Ao pk vel: 0.7 m/s
Area-P 1/2: 2.26 cm2
S' Lateral: 3.2 cm

## 2023-01-12 MED ORDER — METOPROLOL TARTRATE 25 MG PO TABS: 25.0000 mg | ORAL_TABLET | Freq: Two times a day (BID) | ORAL | 3 refills | Status: DC

## 2023-01-12 NOTE — Addendum Note (Signed)
Addended by: Meryl Crutch on: 01/12/2023 03:57 PM   Modules accepted: Orders

## 2023-01-29 ENCOUNTER — Other Ambulatory Visit: Payer: Self-pay | Admitting: Family Medicine

## 2023-01-29 DIAGNOSIS — H6993 Unspecified Eustachian tube disorder, bilateral: Secondary | ICD-10-CM

## 2023-01-29 NOTE — Telephone Encounter (Signed)
Requested Prescriptions  Pending Prescriptions Disp Refills   fluticasone (FLONASE) 50 MCG/ACT nasal spray [Pharmacy Med Name: FLUTICASONE PROP 50 MCG SPRAY] 48 mL 0    Sig: PLACE 2 SPRAYS INTO BOTH NOSTRILS DAILY. USE FOR 4-6 WEEKS THEN STOP AND USE SEASONALLY OR AS NEEDED     Ear, Nose, and Throat: Nasal Preparations - Corticosteroids Passed - 01/29/2023 12:13 PM      Passed - Valid encounter within last 12 months    Recent Outpatient Visits           2 months ago Vasovagal near syncope   [place], DO   5 months ago Trigger middle finger of left hand   [facility] [place], Coralie Keens, NP   8 months ago Eustachian tube dysfunction, bilateral   [place], DO   1 year ago Needs flu shot   [place], DO   1 year ago Acute right-sided low back pain without sciatica   [place], Alexander J, DO       Future Appointments             In 1 week Sharolyn Douglas, Clance Boll, NP [place] [facility]   In 4 months [place], [facility], [facility]

## 2023-02-03 ENCOUNTER — Ambulatory Visit
Admission: RE | Admit: 2023-02-03 | Discharge: 2023-02-03 | Disposition: A | Discharge status: Home / Self Care | Payer: Medicare Other | Attending: Family Medicine | Admitting: Family Medicine

## 2023-02-03 ENCOUNTER — Encounter: Payer: Self-pay | Admitting: Family Medicine

## 2023-02-03 ENCOUNTER — Ambulatory Visit (INDEPENDENT_AMBULATORY_CARE_PROVIDER_SITE_OTHER): Payer: Medicare Other | Admitting: Family Medicine

## 2023-02-03 ENCOUNTER — Other Ambulatory Visit: Payer: Self-pay | Admitting: Urology

## 2023-02-03 ENCOUNTER — Ambulatory Visit
Admission: RE | Admit: 2023-02-03 | Discharge: 2023-02-03 | Disposition: A | Discharge status: Home / Self Care | Payer: Medicare Other | Source: Ambulatory Visit | Attending: Family Medicine | Admitting: Family Medicine

## 2023-02-03 VITALS — BP 112/66 | HR 57 | Temp 96.6°F | Wt 179.0 lb

## 2023-02-03 DIAGNOSIS — M7552 Bursitis of left shoulder: Secondary | ICD-10-CM

## 2023-02-03 DIAGNOSIS — M25512 Pain in left shoulder: Secondary | ICD-10-CM

## 2023-02-03 DIAGNOSIS — G8929 Other chronic pain: Secondary | ICD-10-CM | POA: Diagnosis present

## 2023-02-03 NOTE — Progress Notes (Signed)
Subjective:    Patient ID: Andrew Benson, male    DOB: [birth year], 77 y.o.   MRN: [id number]  Andrew Benson is a 77 y.o. male presenting on 02/03/2023 for Shoulder Pain   HPI  L Hand Trigger Finger Middle finger seems to catch or get stuck with movement. It will pop back and forth and cause pain. He had injection for this by [facility] Dr Sabra Heck it worked great and then symptoms returned. He has not been back yet.  Chronic L Shoulder Pain Last visit 01/2021 for same problem, old prior fall injury on L shoulder 2020, approx 4 yr ago, has had some chronic issues with limtied movement since that.  Limited improvement. He was on conservative therapy but did not return for X-rays or referrals or injection  Now still bothering him, but he can function. Worse if raising arm up or out, he has significantly reduced range of motion.  - He describes symptoms include a variety of symptoms including at rest can have some throbbing pain from neck into shoulder. He had reduced range of motion forward and abduction to side, denies any numbness and tingling into hand and fingers. - Not tried any new OTC medications for pain. Not taking Tylenol or NSAID. Not using heat or ice.      02/03/2023    2:45 PM 12/04/2022    8:11 AM 11/25/2022    3:24 PM  Depression screen PHQ 2/9  Decreased Interest 0 0 0  Down, Depressed, Hopeless 0 0 0  PHQ - 2 Score 0 0 0  Altered sleeping 0 0 0  Tired, decreased energy 0 0 0  Change in appetite 0 0 0  Feeling bad or failure about yourself  0 0 0  Trouble concentrating 0 0 0  Moving slowly or fidgety/restless 0 0 0  Suicidal thoughts 0 0 0  PHQ-9 Score 0 0 0  Difficult doing work/chores Not difficult at all Not difficult at all Not difficult at all    Social History   Tobacco Use   Smoking status: Former    Packs/day: 1.00    Years: 20.00    Total pack years: 20.00    Types: Cigarettes    Quit date: 02/01/1993    Years since quitting: 30.0    Smokeless tobacco: Never  Vaping Use   Vaping Use: Never used  Substance Use Topics   Alcohol use: Not Currently    Comment: social drinker.   Drug use: No    Review of Systems Per HPI unless specifically indicated above     Objective:    BP 112/66 (BP Location: Right Arm, Patient Position: Sitting, Cuff Size: Normal)   Pulse (!) 57   Temp (!) 96.6 F (35.9 C) (Temporal)   Wt 179 lb (81.2 kg)   SpO2 99%   BMI 24.97 kg/m   Wt Readings from Last 3 Encounters:  02/03/23 179 lb (81.2 kg)  01/05/23 175 lb (79.4 kg)  01/01/23 181 lb 8 oz (82.3 kg)    Physical Exam Vitals and nursing note reviewed.  Constitutional:      General: He is not in acute distress.    Appearance: Normal appearance. He is well-developed. He is not diaphoretic.     Comments: Well-appearing, comfortable, cooperative  HENT:     Head: Normocephalic and atraumatic.  Eyes:     General:        Right eye: No discharge.        Left eye: No  discharge.     Conjunctiva/sclera: Conjunctivae normal.  Cardiovascular:     Rate and Rhythm: Normal rate.  Pulmonary:     Effort: Pulmonary effort is normal.  Musculoskeletal:     Comments: L hand palmar middle finger with triggering, nodule palpable and catches on movement with pain.  Left Shoulder Inspection: Normal appearance bilateral symmetrical Palpation: Non-tender to palpation over anterior, lateral, or posterior shoulder  ROM: Significantly reduced range of motion forward flex and abduct above head L shoulder, using other arm to assist. Special Testing: Rotator cuff testing unable to be fully tested due to reduced range of motion some provoked pain and weakness and impingement positive Strength: Normal strength 5/5 flex/ext, ext rot / int rot, grip, rotator cuff str testing. Neurovascular: Distally intact pulses, sensation to light touch   Skin:    General: Skin is warm and dry.     Findings: No erythema or rash.  Neurological:     Mental Status: He is  alert and oriented to person, place, and time.  Psychiatric:        Mood and Affect: Mood normal.        Behavior: Behavior normal.        Thought Content: Thought content normal.     Comments: Well groomed, good eye contact, normal speech and thoughts      Results for orders placed or performed in visit on 01/11/23  ECHOCARDIOGRAM COMPLETE  Result Value Ref Range   AR max vel 3.81 cm2   AV Peak grad 1.9 mmHg   Ao pk vel 0.70 m/s   S' Lateral 3.20 cm   Area-P 1/2 2.26 cm2   AV Area VTI 3.95 cm2   AV Mean grad 1.0 mmHg   AV Area mean vel 3.43 cm2   Est EF 55 - 60%       Assessment & Plan:   Problem List Items Addressed This Visit   None Visit Diagnoses     Chronic left shoulder pain    -  Primary   Relevant Orders   DG Shoulder Left   Chronic bursitis of left shoulder       Relevant Orders   DG Shoulder Left       Trigger finger injection discussion today I recommend return to [facility] Dr Sabra Heck for repeat trigger finger injection, usually after 1-3 of these they may pursue the surgical release, it is a fairly straight forward procedure and is more of a permanent fix  The steroid shot is only temporary relief and if it does not resolve the problem with inflammation, then it is likely a structural problem with the tendon that is getting stuck or caught on the pulley system   For the Left Shoulder  Consistent with CHRONIC 4+ years of symptoms Left-shoulder bursitis vs rotator cuff tendinopathy with some reduced active ROM Old injury with fall 4 years ago likely provoked Last exam with me 2022 on this No imaging Likely underlying arthritis component  Concern for frozen shoulder adhesive capsulitis at this point with his notably reduced range of motion   Plan: On Eliquis, avoid oral NSAID - should continue topical OTC Diclofenac 1-4 times a day PRN May take Tylenol Ex Str 1-2 q 6 hr PRN Relative rest but keep shoulder mobile, demonstrated ROM exercises, avoid  heavy lifting  X-rays ordered L Shoulder F/u results and offer return for subacromial steroid injection vs referral to ortho vs sports, may warrant MRI, concern again frozen shoulder or chronic tendinopathy  Orders Placed This Encounter  Procedures   DG Shoulder Left    Standing Status:   Future    Number of Occurrences:   1    Standing Expiration Date:   02/04/2024    Order Specific Question:   Reason for Exam (SYMPTOM  OR DIAGNOSIS REQUIRED)    Answer:   chronic left shoulder pain and limited range of motion, likely bursitis vs chronic tendinopathy    Order Specific Question:   Preferred imaging location?    Answer:   [facility]     No orders of the defined types were placed in this encounter.     Follow up plan: Return if symptoms worsen or fail to improve.   [name], [facility] 02/03/2023, 1:46 PM

## 2023-02-03 NOTE — Patient Instructions (Addendum)
Thank you for coming to the office today.  Trigger finger injection discussion today I recommend return to [facility] Dr Sabra Heck for repeat trigger finger injection, usually after 1-3 of these they may pursue the surgical release, it is a fairly straight forward procedure and is more of a permanent fix  The steroid shot is only temporary relief and if it does not resolve the problem with inflammation, then it is likely a structural problem with the tendon that is getting stuck or caught on the pulley system  For the Left Shoulder  X-ray today Stay tuned for results on mychart.  Based on those I can let you know if you need an injection.  We can arrange that anytime.  Please schedule a Follow-up Appointment to: Return if symptoms worsen or fail to improve.  If you have any other questions or concerns, please feel free to call the office or send a message through [place]. You may also schedule an earlier appointment if necessary.  Additionally, you may be receiving a survey about your experience at our office within a few days to 1 week by e-mail or mail. We value your feedback.  [name], DO [place]

## 2023-02-05 ENCOUNTER — Ambulatory Visit: Payer: Medicare Other | Attending: Nurse Practitioner | Admitting: Nurse Practitioner

## 2023-02-05 ENCOUNTER — Encounter: Payer: Self-pay | Admitting: Nurse Practitioner

## 2023-02-05 ENCOUNTER — Other Ambulatory Visit: Payer: Self-pay | Admitting: Urology

## 2023-02-05 VITALS — BP 112/82 | HR 57 | Ht 71.0 in | Wt 179.2 lb

## 2023-02-05 DIAGNOSIS — I480 Paroxysmal atrial fibrillation: Secondary | ICD-10-CM

## 2023-02-05 DIAGNOSIS — N182 Chronic kidney disease, stage 2 (mild): Secondary | ICD-10-CM | POA: Diagnosis not present

## 2023-02-05 DIAGNOSIS — R002 Palpitations: Secondary | ICD-10-CM

## 2023-02-05 DIAGNOSIS — I471 Supraventricular tachycardia, unspecified: Secondary | ICD-10-CM

## 2023-02-05 DIAGNOSIS — R0609 Other forms of dyspnea: Secondary | ICD-10-CM

## 2023-02-05 DIAGNOSIS — E782 Mixed hyperlipidemia: Secondary | ICD-10-CM

## 2023-02-05 DIAGNOSIS — R55 Syncope and collapse: Secondary | ICD-10-CM

## 2023-02-05 DIAGNOSIS — I4729 Other ventricular tachycardia: Secondary | ICD-10-CM

## 2023-02-05 NOTE — Patient Instructions (Addendum)
Medication Instructions:  Your physician recommends that you continue on your current medications as directed. Please refer to the Current Medication list given to you today.  *If you need a refill on your cardiac medications before your next appointment, please call your pharmacy*   Lab Work: No labs ordered  If you have labs (blood work) drawn today and your tests are completely normal, you will receive your results only by: [place] (if you have MyChart) OR A paper copy in the mail If you have any lab test that is abnormal or we need to change your treatment, we will call you to review the results.   Testing/Procedures: No testing ordered  Follow-Up: At [facility] you and your health needs are our priority.  As part of our continuing mission to provide you with exceptional heart care, we have created designated Provider Care Teams.  These Care Teams include your primary Cardiologist (physician) and Advanced Practice Providers (APPs -  Physician Assistants and Nurse Practitioners) who all work together to provide you with the care you need, when you need it.  We recommend signing up for the patient portal called "MyChart".  Sign up information is provided on this After Visit Summary.  MyChart is used to connect with patients for Virtual Visits (Telemedicine).  Patients are able to view lab/test results, encounter notes, upcoming appointments, etc.  Non-urgent messages can be sent to your provider as well.   To learn more about what you can do with MyChart, go to [email or web address].    Your next appointment:   6 month(s)  Provider:   Ida Rogue, MD

## 2023-02-05 NOTE — Progress Notes (Signed)
Office Visit    Patient Name: Andrew Benson Date of Encounter: 02/05/2023  Primary Care Provider:  Olin Hauser, DO Primary Cardiologist:  Ida Rogue, MD  Chief Complaint    77 year old male with a history of paroxysmal atrial fibrillation, hypertension, hyperlipidemia, and stage II-III chronic kidney disease, who presents for follow-up related to dyspnea.  Past Medical History    Past Medical History:  Diagnosis Date   Asthma    CKD (chronic kidney disease), stage II    Diastolic dysfunction    a. 12/2022 Echo: EF 55-60%, no rwma, GrI DD, mild LVH, nl RV fxn, Ao sclerosis. Ao root 55m.   Dilated aortic root ([facility]    a. 12/2022 Echo: Ao root 457m   Essential hypertension    History of cardiovascular stress test    a. 12/2022 Lexi MV: EF 34%, freq PVCs, no ST/T changes, HTN response (211/98), no isch/infarct. CT attenuation images w/o significant Ao or Cor atherosclerosis.   Hyperlipidemia    NSVT (nonsustained ventricular tachycardia) (HCC)    PAF (paroxysmal atrial fibrillation) ([facility]   a. CHA2DS2VASc = 3;  b. takes amio/eliquis/metoprolol prn for PAF.   Palpitations    a. 11/2022 Zio: Predominantly sinus rhythm at 65 (40-255).  4 runs of NSVT, longest 8 beats (162), fastest 255 (6 beats). 8 SVT runs, longests 15 beats (127), fastest 143 5 beats). Rare PACs/PVCs.   PSVT (paroxysmal supraventricular tachycardia)    TMJ locking    Past Surgical History:  Procedure Laterality Date   COLONOSCOPY WITH PROPOFOL N/A 12/29/2017   Procedure: COLONOSCOPY WITH PROPOFOL;  Surgeon: ElManya SilvasMD;  [facility];  Service: Endoscopy;  Laterality: N/A;   COLONOSCOPY WITH PROPOFOL N/A 07/02/2021   Procedure: COLONOSCOPY WITH PROPOFOL;  Surgeon: Toledo, TeBenay PikeMD;  Location: [facility] ENDOSCOPY;  Service: Gastroenterology;  Laterality: N/A;   FINGER SURGERY     left index finger   INGUINAL HERNIA REPAIR Right 2007   INGUINAL HERNIA REPAIR Left 2008    done x 2   KNEE SURGERY     left     Allergies  Allergies  Allergen Reactions   Penicillins     Rash Rash     History of Present Illness    [age]ear old male with the above past medical history including paroxysmal atrial fibrillation, hypertension, hyperlipidemia, and stage II chronic kidney disease.  He has a history of atrial fibrillation dating back approximately 25 years.  Most recent profound episode occurred in 2019, while drinking coffee.  He has been anticoagulated with Eliquis in the setting of a CHA2DS2-VASc 3-4 (hypertension, age x 2, chronic small vessel disease noted on brain MRI previously), and has a prescription for amiodarone to be used as needed.  Most recently, he has been taking metoprolol 25 mg twice daily.  In December 2023, he reported intermittent palpitations associated with presyncope.  ZIO monitoring was performed and showed brief runs of nonsustained VT and PSVT as outlined in the past medical history.  There were no triggered events however, he says that he forgot he was supposed to trigger the monitor during symptoms.  At follow-up in January, he reported some dyspnea on exertion with reduced activity tolerance.  In the setting of the symptoms nonsustained VT on monitoring, decision was made to perform stress testing.  Prior to this occurring however, January 16, he was seen in the emergency department with palpitations and hypertension.  Workup was unremarkable and he was discharged home.  He  underwent an exercise Myoview on January 08, 2023 showing an EF of 34% with mild global hypokinesis without ischemia.  He was noted to have frequent PVCs and stage I and stage II as well as hypertensive response to exercise (211/90).  CT attenuation corrected images showed no significant aortic atherosclerosis or coronary calcification.  In the setting of LV dysfunction on stress testing, an echocardiogram was undertaken and showed an EF of 55 to 60% without regional wall motion  abnormalities, mild LVH, grade 1 diastolic dysfunction, aortic sclerosis, and aortic root measuring 40 mm.   Since his last follow-up and testing, he notes that he has generally been feeling well.  Episodes of palpitations or presyncope have occurred less frequently on twice daily beta-blocker.  He has been tolerating beta-blocker well.  He does not experience chest pain and was also noted some improvement in activity tolerance.  He denies PND, orthopnea, syncope, edema, or early satiety.  Home Medications    Current Outpatient Medications  Medication Sig Dispense Refill   amiodarone (PACERONE) 200 MG tablet TAKE 1 TABLET (200 MG TOTAL) BY MOUTH DAILY AS NEEDED (TAKE FOR A FIB). 90 tablet 3   apixaban (ELIQUIS) 5 MG TABS tablet Take 1 tablet (5 mg total) by mouth 2 (two) times daily. 60 tablet 6   fluticasone (FLONASE) 50 MCG/ACT nasal spray PLACE 2 SPRAYS INTO BOTH NOSTRILS DAILY. USE FOR 4-6 WEEKS THEN STOP AND USE SEASONALLY OR AS NEEDED 48 mL 0   lisinopril (ZESTRIL) 2.5 MG tablet TAKE 1 TABLET BY MOUTH EVERY DAY 90 tablet 1   metoprolol tartrate (LOPRESSOR) 25 MG tablet Take 1 tablet (25 mg total) by mouth 2 (two) times daily. 180 tablet 3   Multiple Vitamin (MULTIVITAMIN) capsule Take 1 capsule by mouth daily.     rosuvastatin (CRESTOR) 20 MG tablet Take 1 tablet (20 mg total) by mouth daily. 90 tablet 1   tamsulosin (FLOMAX) 0.4 MG CAPS capsule TAKE 1 CAPSULE BY MOUTH EVERY DAY 90 capsule 3   tadalafil (CIALIS) 20 MG tablet Take 1 tablet (20 mg total) by mouth daily as needed for erectile dysfunction. (Patient not taking: Reported on 02/05/2023) 30 tablet 0   No current facility-administered medications for this visit.     Review of Systems    Occasional palpitation and also occasional not necessarily related to episode of presyncope which just lasts a second or so and resolve spontaneously.  No significant dyspnea exertion.  He denies chest pain, PND, orthopnea, syncope, edema, or early  satiety all other systems reviewed and are otherwise negative except as noted above.    Physical Exam    VS:  BP 112/82 (BP Location: Left Arm, Patient Position: Sitting, Cuff Size: Normal)   Pulse (!) 57   Ht 5' 11"$  (1.803 m)   Wt 179 lb 3.2 oz (81.3 kg)   SpO2 98%   BMI 24.99 kg/m  , BMI Body mass index is 24.99 kg/m.     GEN: Well nourished, well developed, in no acute distress. HEENT: normal. Neck: Supple, no JVD, carotid bruits, or masses. Cardiac: RRR, no murmurs, rubs, or gallops. No clubbing, cyanosis, edema.  Radials 2+/PT 2+ and equal bilaterally.  Respiratory:  Respirations regular and unlabored, clear to auscultation bilaterally. GI: Soft, nontender, nondistended, BS + x 4. MS: no deformity or atrophy. Skin: warm and dry, no rash. Neuro:  Strength and sensation are intact. Psych: Normal affect.  Accessory Clinical Findings    ECG personally reviewed by me today -sinus  bradycardia, 57, nonspecific T changes- no acute changes.  Lab Results  Component Value Date   WBC 4.9 01/05/2023   HGB 14.9 01/05/2023   HCT 43.1 01/05/2023   MCV 87.8 01/05/2023   PLT 134 (L) 01/05/2023   Lab Results  Component Value Date   CREATININE 1.20 01/05/2023   BUN 21 01/05/2023   NA 140 01/05/2023   K 3.5 01/05/2023   CL 108 01/05/2023   CO2 27 01/05/2023   Lab Results  Component Value Date   ALT 17 01/05/2023   AST 25 01/05/2023   ALKPHOS 77 01/05/2023   BILITOT 0.9 01/05/2023   Lab Results  Component Value Date   CHOL 170 03/23/2022   HDL 59 03/23/2022   LDLCALC 98 03/23/2022   TRIG 67 03/23/2022   CHOLHDL 2.9 03/23/2022    Lab Results  Component Value Date   HGBA1C 5.3 08/30/2020    Assessment & Plan    1.  Dyspnea on exertion: Patient noted earlier this winter in increase in dyspnea exertion and decline in activity tolerance, specifically with higher levels of activity such as walking fast or carrying boxes.  Stress testing showed no ischemia or infarct.   Further, no significant coronary or aortic calcifications were noted.  EF was recorded as low on stress test and this was followed by echocardiogram showed normal LV function with grade 1 diastolic dysfunction and without significant valvular disease.  Patient has been doing well.  We discussed his results in detail today.  He does not experience chest pain and has not been having significant dyspnea on exertion.  Encouraged regular exercise/conditioning.  No further ischemic evaluation at this time.  2.  Presyncope/PSVT/nonsustained VT: ZIO monitoring late last year showed brief runs of nonsustained VT and PSVT.  There were no triggered events, though he says he had 3-4 brief episodes of presyncope during the monitoring period.  Frequent PVCs were noted early during his stress test (did not take metoprolol for 24 hours prior to test).  As above, no ischemia or infarct on stress testing with normal LV function by echo.  He has since been taking metoprolol 25 mg twice daily with improvement in palpitation and presyncopal symptoms, now occurring infrequently and lasting just a second or so.  Continue current dose of beta-blocker therapy.  3.  Paroxysmal atrial fibrillation: Maintaining sinus rhythm on beta-blocker therapy.  He has amiodarone to be used as needed.  He remains on Eliquis in the setting of a CHA2DS2-VASc of 3-4.  4.  Essential hypertension: Blood pressure is normal today.  He did have a hypertensive response to exercise at the time of his stress test, which might be contributing some to his dyspnea on exertion, though it is notable that he did not take his beta-blocker for 24 hours prior to his treadmill study.  Continue beta-blocker and lisinopril.  5.  Hyperlipidemia: Remains on rosuvastatin therapy with an LDL of 98 last year.  6.  Stage II chronic kidney disease: Creatinine 1.20 in January. Cont lisinopril.  7.  Disposition:  F/u in 6 mos or sooner if necessary.  Murray Hodgkins,  NP 02/05/2023, 8:34 AM

## 2023-02-10 ENCOUNTER — Encounter: Payer: Self-pay | Admitting: Urology

## 2023-02-10 ENCOUNTER — Ambulatory Visit (INDEPENDENT_AMBULATORY_CARE_PROVIDER_SITE_OTHER): Payer: Medicare Other | Admitting: Urology

## 2023-02-10 VITALS — BP 121/78 | HR 62 | Ht 71.0 in | Wt 181.0 lb

## 2023-02-10 DIAGNOSIS — N529 Male erectile dysfunction, unspecified: Secondary | ICD-10-CM

## 2023-02-10 DIAGNOSIS — N401 Enlarged prostate with lower urinary tract symptoms: Secondary | ICD-10-CM

## 2023-02-10 DIAGNOSIS — N138 Other obstructive and reflux uropathy: Secondary | ICD-10-CM

## 2023-02-10 DIAGNOSIS — R339 Retention of urine, unspecified: Secondary | ICD-10-CM | POA: Diagnosis not present

## 2023-02-10 DIAGNOSIS — Z125 Encounter for screening for malignant neoplasm of prostate: Secondary | ICD-10-CM

## 2023-02-10 LAB — BLADDER SCAN AMB NON-IMAGING

## 2023-02-10 MED ORDER — TADALAFIL 10 MG PO TABS: 10.0000 mg | ORAL_TABLET | Freq: Every day | ORAL | 3 refills | Status: DC

## 2023-02-10 MED ORDER — TADALAFIL 10 MG PO TABS: ORAL_TABLET | ORAL | 3 refills | Status: DC

## 2023-02-10 MED ORDER — TAMSULOSIN HCL 0.4 MG PO CAPS: 0.4000 mg | ORAL_CAPSULE | Freq: Every day | ORAL | 3 refills | Status: DC

## 2023-02-10 NOTE — Progress Notes (Signed)
   02/10/2023 2:20 PM   Andrew Benson [birth year] [id number]  Reason for visit: Follow up ED, BPH, elevated PSA  HPI: 77 year old male who has previously been followed by Dr. Erlene Quan.  He requested to change to a male provider and was put on my schedule today.  He has a long history of a very mildly elevated PSA ranging from 3-6, and a prostate MRI in 2020 showed a 100 g prostate with no suspicious lesions and a large median lobe.  I personally viewed and interpreted those images.  Most recent PSA stable at 5.3 from April 2023.  Mild PSA elevation most likely secondary to BPH, and PSA density very reassuring.  We reviewed the AUA guidelines that do not recommend routine screening in men over age 65, and I do not think he needs ongoing PSA monitoring, especially with a negative prostate MRI and significant BPH with low PSA density.    He has been on Flomax long-term which is controlled his BPH urinary symptoms well.  He occasionally has some weak or slow stream but this is minimally bothersome.  PVR today normal at 63m.  His primary concern today is erectile dysfunction.  It sounds like he was prescribed 20 mg Cialis on demand, but he had concerns about taking this medication with Flomax.  He took the Cialis a few times which did help with the erections, but he has been off it for quite a while as he was concerned about potential interaction with Flomax.  He is interested in continuing the Cialis if able secondary to the spontaneity it allows compared to the Viagra.  I recommended not taking the Cialis and Flomax at the same time, but that these can certainly safely be taken together, with very low risk of orthostatic hypotension or dizziness.  I recommended trying Cialis 10 mg daily or every other day, with a 10 mg boost as needed.  Risk and benefits were discussed.  We also discussed considering checking a testosterone in the future if he has persistent ED despite max dose Cialis.  I also recommend he  reach out to his cardiologist to see if there is an alternative blood pressure medication to metoprolol, as this may improve his ED as well.  Flomax refilled Trial of Cialis 10 mg daily or every other day, with additional 10 mg boost dose as needed RTC 1 year PVR  BBilley Co MD  [id number] [place] [place] [phone]

## 2023-02-10 NOTE — Patient Instructions (Signed)
Erectile Dysfunction ?Erectile dysfunction (ED) is the inability to get or keep an erection in order to have sexual intercourse. ED is considered a symptom of an underlying disorder and is not considered a disease. ED may include: ?Inability to get an erection. ?Lack of enough hardness of the erection to allow penetration. ?Loss of erection before sex is finished. ?What are the causes? ?This condition may be caused by: ?Physical causes, such as: ?Artery problems. This may include heart disease, high blood pressure, atherosclerosis, and diabetes. ?Hormonal problems, such as low testosterone. ?Obesity. ?Nerve problems. This may include back or pelvic injuries, multiple sclerosis, Parkinson's disease, spinal cord injury, and stroke. ?Certain medicines, such as: ?Pain relievers. ?Antidepressants. ?Blood pressure medicines and water pills (diuretics). ?Cancer medicines. ?Antihistamines. ?Muscle relaxants. ?Lifestyle factors, such as: ?Use of drugs such as marijuana, cocaine, or opioids. ?Excessive use of alcohol. ?Smoking. ?Lack of physical activity or exercise. ?Psychological causes, such as: ?Anxiety or stress. ?Sadness or depression. ?Exhaustion. ?Fear about sexual performance. ?Guilt. ?What are the signs or symptoms? ?Symptoms of this condition include: ?Inability to get an erection. ?Lack of enough hardness of the erection to allow penetration. ?Loss of the erection before sex is finished. ?Sometimes having normal erections, but with frequent unsatisfactory episodes. ?Low sexual satisfaction in either partner due to erection problems. ?A curved penis occurring with erection. The curve may cause pain, or the penis may be too curved to allow for intercourse. ?Never having nighttime or morning erections. ?How is this diagnosed? ?This condition is often diagnosed by: ?Performing a physical exam to find other diseases or specific problems with the penis. ?Asking you detailed questions about the problem. ?Doing tests,  such as: ?Blood tests to check for diabetes mellitus or high cholesterol, or to measure hormone levels. ?Other tests to check for underlying health conditions. ?An ultrasound exam to check for scarring. ?A test to check blood flow to the penis. ?Doing a sleep study at home to measure nighttime erections. ?How is this treated? ?This condition may be treated by: ?Medicines, such as: ?Medicine taken by mouth to help you achieve an erection (oral medicine). ?Hormone replacement therapy to replace low testosterone levels. ?Medicine that is injected into the penis. Your health care provider may instruct you how to give yourself these injections at home. ?Medicine that is delivered with a short applicator tube. The tube is inserted into the opening at the tip of the penis, which is the opening of the urethra. A tiny pellet of medicine is put in the urethra. The pellet dissolves and enhances erectile function. This is also called MUSE (medicated urethral system for erections) therapy. ?Vacuum pump. This is a pump with a ring on it. The pump and ring are placed on the penis and used to create pressure that helps the penis become erect. ?Penile implant surgery. In this procedure, you may receive: ?An inflatable implant. This consists of cylinders, a pump, and a reservoir. The cylinders can be inflated with a fluid that helps to create an erection, and they can be deflated after intercourse. ?A semi-rigid implant. This consists of two silicone rubber rods. The rods provide some rigidity. They are also flexible, so the penis can both curve downward in its normal position and become straight for sexual intercourse. ?Blood vessel surgery to improve blood flow to the penis. During this procedure, a blood vessel from a different part of the body is placed into the penis to allow blood to flow around (bypass) damaged or blocked blood vessels. ?Lifestyle changes,   such as exercising more, losing weight, and quitting smoking. ?Follow  these instructions at home: ?Medicines ? ?Take over-the-counter and prescription medicines only as told by your health care provider. Do not increase the dosage without first discussing it with your health care provider. ?If you are using self-injections, do injections as directed by your health care provider. Make sure you avoid any veins that are on the surface of the penis. After giving an injection, apply pressure to the injection site for 5 minutes. ?Talk to your health care provider about how to prevent headaches while taking ED medicines. These medicines may cause a sudden headache due to the increase in blood flow in your body. ?General instructions ?Exercise regularly, as directed by your health care provider. Work with your health care provider to lose weight, if needed. ?Do not use any products that contain nicotine or tobacco. These products include cigarettes, chewing tobacco, and vaping devices, such as e-cigarettes. If you need help quitting, ask your health care provider. ?Before using a vacuum pump, read the instructions that come with the pump and discuss any questions with your health care provider. ?Keep all follow-up visits. This is important. ?Contact a health care provider if: ?You feel nauseous. ?You are vomiting. ?You get sudden headaches while taking ED medicines. ?You have any concerns about your sexual health. ?Get help right away if: ?You are taking oral or injectable medicines and you have an erection that lasts longer than 4 hours. If your health care provider is unavailable, go to the nearest emergency room for evaluation. An erection that lasts much longer than 4 hours can result in permanent damage to your penis. ?You have severe pain in your groin or abdomen. ?You develop redness or severe swelling of your penis. ?You have redness spreading at your groin or lower abdomen. ?You are unable to urinate. ?You experience chest pain or a rapid heartbeat (palpitations) after taking oral  medicines. ?These symptoms may represent a serious problem that is an emergency. Do not wait to see if the symptoms will go away. Get medical help right away. Call your local emergency services (911 in the [place] Do not drive yourself to the hospital. ?Summary ?Erectile dysfunction (ED) is the inability to get or keep an erection during sexual intercourse. ?This condition is diagnosed based on a physical exam, your symptoms, and tests to determine the cause. Treatment varies depending on the cause and may include medicines, hormone therapy, surgery, or a vacuum pump. ?You may need follow-up visits to make sure that you are using your medicines or devices correctly. ?Get help right away if you are taking or injecting medicines and you have an erection that lasts longer than 4 hours. ?This information is not intended to replace advice given to you by your health care provider. Make sure you discuss any questions you have with your health care provider. ?Document Revised: 03/05/2021 Document Reviewed: 03/05/2021 ?Elsevier Patient Education ? 2023 [employer] ? ?

## 2023-02-15 ENCOUNTER — Encounter: Payer: Self-pay | Admitting: Family Medicine

## 2023-02-15 ENCOUNTER — Ambulatory Visit (INDEPENDENT_AMBULATORY_CARE_PROVIDER_SITE_OTHER): Payer: Medicare Other | Admitting: Family Medicine

## 2023-02-15 VITALS — BP 128/76 | HR 50 | Temp 96.8°F | Wt 180.0 lb

## 2023-02-15 DIAGNOSIS — G8929 Other chronic pain: Secondary | ICD-10-CM

## 2023-02-15 DIAGNOSIS — M7552 Bursitis of left shoulder: Secondary | ICD-10-CM | POA: Diagnosis not present

## 2023-02-15 DIAGNOSIS — M25512 Pain in left shoulder: Secondary | ICD-10-CM

## 2023-02-15 MED ORDER — METHYLPREDNISOLONE ACETATE 40 MG/ML IJ SUSP
40.0000 mg | Freq: Once | INTRAMUSCULAR | Status: AC
  Administered 2023-02-15: 40 mg via INTRA_ARTICULAR

## 2023-02-15 MED ORDER — LIDOCAINE HCL (PF) 1 % IJ SOLN
4.0000 mL | Freq: Once | INTRAMUSCULAR | Status: AC
  Administered 2023-02-15: 4 mL

## 2023-02-15 NOTE — Progress Notes (Signed)
Subjective:    Patient ID: Andrew Benson, male    DOB: 04/24/1946, 77 y.o.   MRN: [id number]  Andrew Benson is a 77 y.o. male presenting on 02/15/2023 for Shoulder Pain (/)   Patient presents for a same day appointment.    HPI  Chronic L Shoulder Pain Previous history visit 01/2021 for same problem, old prior fall injury on L shoulder 2020, approx 4 yr ago, has had some chronic issues with limtied movement since that.   Last visit recently 02/03/23, for same issue. Proceeded w/ X-ray imaging and it was negative for arthritis or degenerative changes or acute abnormality. See results below.   Now still bothering him, but he can function. Worse if raising arm up or out, he has significantly reduced range of motion.  - He describes symptoms include a variety of symptoms including at rest can have some throbbing pain from neck into shoulder. He had reduced range of motion forward and abduction to side, denies any numbness and tingling into hand and fingers. - Not tried any new OTC medications for pain. Not taking Tylenol or NSAID. Not using heat or ice.   Today interested in cortisone injection     02/03/2023    2:45 PM 12/04/2022    8:11 AM 11/25/2022    3:24 PM  Depression screen PHQ 2/9  Decreased Interest 0 0 0  Down, Depressed, Hopeless 0 0 0  PHQ - 2 Score 0 0 0  Altered sleeping 0 0 0  Tired, decreased energy 0 0 0  Change in appetite 0 0 0  Feeling bad or failure about yourself  0 0 0  Trouble concentrating 0 0 0  Moving slowly or fidgety/restless 0 0 0  Suicidal thoughts 0 0 0  PHQ-9 Score 0 0 0  Difficult doing work/chores Not difficult at all Not difficult at all Not difficult at all    Social History   Tobacco Use   Smoking status: Former    Packs/day: 1.00    Years: 20.00    Total pack years: 20.00    Types: Cigarettes    Quit date: 02/01/1993    Years since quitting: 30.0    Passive exposure: Past   Smokeless tobacco: Never  Vaping Use   Vaping Use:  Never used  Substance Use Topics   Alcohol use: Not Currently    Comment: social drinker.   Drug use: No    Review of Systems Per HPI unless specifically indicated above     Objective:    BP 128/76 (BP Location: Left Arm, Patient Position: Sitting, Cuff Size: Normal)   Pulse (!) 50   Temp (!) 96.8 F (36 C) (Temporal)   Wt 180 lb (81.6 kg)   SpO2 99%   BMI 25.10 kg/m   Wt Readings from Last 3 Encounters:  02/15/23 180 lb (81.6 kg)  02/10/23 181 lb (82.1 kg)  02/05/23 179 lb 3.2 oz (81.3 kg)    Physical Exam Vitals and nursing note reviewed.  Constitutional:      General: He is not in acute distress.    Appearance: Normal appearance. He is well-developed. He is not diaphoretic.     Comments: Well-appearing, comfortable, cooperative  HENT:     Head: Normocephalic and atraumatic.  Eyes:     General:        Right eye: No discharge.        Left eye: No discharge.     Conjunctiva/sclera: Conjunctivae normal.  Cardiovascular:  Rate and Rhythm: Normal rate.  Pulmonary:     Effort: Pulmonary effort is normal.  Musculoskeletal:     Comments: Left Shoulder Inspection: Normal appearance bilateral symmetrical Palpation: Non-tender to palpation over anterior, lateral, or posterior shoulder  ROM: Significantly reduced range of motion forward flex and abduct above head L shoulder, using other arm to assist. Special Testing: Rotator cuff testing unable to be fully tested due to reduced range of motion some provoked pain and weakness and impingement positive Strength: Normal strength 5/5 flex/ext, ext rot / int rot, grip, rotator cuff str testing. Neurovascular: Distally intact pulses, sensation to light touch   Skin:    General: Skin is warm and dry.     Findings: No erythema or rash.  Neurological:     Mental Status: He is alert and oriented to person, place, and time.  Psychiatric:        Mood and Affect: Mood normal.        Behavior: Behavior normal.        Thought  Content: Thought content normal.     Comments: Well groomed, good eye contact, normal speech and thoughts    ________________________________________________________ PROCEDURE NOTE Date: 02/15/23 Left Shoulder subacromial injection Discussed benefits and risks (including pain, bleeding, infection, steroid flare). Verbal consent given by patient. Medication:  1 cc Depo-medrol '40mg'$  and 4 cc Lidocaine 1% without epi Time Out taken  Landmarks identified. Area cleansed with alcohol wipes. Using 21 gauge and 1, 1/2 inch needle, Left subacromial bursa space was injected (with above listed medication) via posterior approach cold spray used for superficial anesthetic. Sterile bandage placed. Patient tolerated procedure well without bleeding or paresthesias. No complications.    I have personally reviewed the radiology report from 02/06/23 on L Shoulder X-ray.  Narrative & Impression  CLINICAL DATA:  Chronic left shoulder pain   EXAM: LEFT SHOULDER - 3 VIEW   COMPARISON:  None Available.   FINDINGS: There is no evidence of fracture or dislocation. There is no evidence of arthropathy or other focal bone abnormality. Soft tissues are unremarkable.   IMPRESSION: No acute osseous abnormality     Electronically Signed   By: Jill Side M.D.   On: 02/06/2023 12:19    Results for orders placed or performed in visit on 02/10/23  BLADDER SCAN AMB NON-IMAGING  Result Value Ref Range   Scan Result 55m       Assessment & Plan:   Problem List Items Addressed This Visit   None Visit Diagnoses     Chronic left shoulder pain    -  Primary   Relevant Medications   lidocaine (PF) (XYLOCAINE) 1 % injection 4 mL (Completed)   methylPREDNISolone acetate (DEPO-MEDROL) injection 40 mg (Completed)   Chronic bursitis of left shoulder       Relevant Medications   lidocaine (PF) (XYLOCAINE) 1 % injection 4 mL (Completed)   methylPREDNISolone acetate (DEPO-MEDROL) injection 40 mg (Completed)        Consistent with CHRONIC 4+ years of symptoms Left-shoulder bursitis vs rotator cuff tendinopathy with some reduced active ROM Old injury with fall 4 years ago likely provoked Last exam with me 2022 on this Likely underlying arthritis component  X-ray 02/06/23 reviewed.   Concern for frozen shoulder adhesive capsulitis at this point with his notably reduced range of motion  Proceed w/ subacromial joint injection today, performed. See note. Tolerated well. He had notable improvement immediately after. Discussed aftercare instructions  On Eliquis, avoid oral NSAID - should  continue topical OTC Diclofenac 1-4 times a day PRN May take Tylenol Ex Str 1-2 q 6 hr PRN Relative rest but keep shoulder mobile, demonstrated ROM exercises, avoid heavy lifting    If not improving 4-6 weeks we can follow up and proceed w/ referral to ortho vs sports, may warrant MRI, concern again frozen shoulder or chronic tendinopathy  No orders of the defined types were placed in this encounter.     Meds ordered this encounter  Medications   lidocaine (PF) (XYLOCAINE) 1 % injection 4 mL   methylPREDNISolone acetate (DEPO-MEDROL) injection 40 mg      Follow up plan: Return if symptoms worsen or fail to improve.   [name], [facility] 02/15/2023, 11:12 AM

## 2023-02-15 NOTE — Patient Instructions (Signed)
Thank you for coming to the office today.  You received a Left Shoulder Joint steroid injection today. - Lidocaine numbing medicine may ease the pain initially for a few hours until it wears off - As discussed, you may experience a "steroid flare" this evening or within 24-48 hours, anytime medicine is injected into an inflamed joint it can cause the pain to get worse temporarily - Everyone responds differently to these injections, it depends on the patient and the severity of the joint problem, it may provide anywhere from days to weeks, to months of relief. Ideal response is >6 months relief - Try to take it easy for next 1-2 days, avoid over activity and strain on joint (limit lifting for shoulder) - Recommend the following:   - For swelling - rest, compression sleeve / ACE wrap, elevation, and ice packs as needed for first few days   - For pain in future may use heating pad or moist heat as needed  If not improving as expected over next several weeks, please follow-up sooner for re-evaluation.  We can consider referral at that point   Please schedule a Follow-up Appointment to: No follow-ups on file.  If you have any other questions or concerns, please feel free to call the office or send a message through [place]. You may also schedule an earlier appointment if necessary.  Additionally, you may be receiving a survey about your experience at our office within a few days to 1 week by e-mail or mail. We value your feedback.  [name], DO [place]

## 2023-04-01 ENCOUNTER — Other Ambulatory Visit: Payer: Self-pay | Admitting: Cardiovascular Disease

## 2023-04-01 DIAGNOSIS — E785 Hyperlipidemia, unspecified: Secondary | ICD-10-CM

## 2023-04-13 ENCOUNTER — Telehealth: Payer: Self-pay | Admitting: Urology

## 2023-04-13 NOTE — Telephone Encounter (Signed)
Patient called to cancel lab appointment for 4/26, since he will be out of town.  He was previously a patient of Dr. Apolinar Junes, and he switched to Dr. Richardo Hanks. The lab appointment was scheduled last year when he was seeing Dr. Apolinar Junes. Does Dr. Richardo Hanks want him to have his PSA checked?

## 2023-04-14 NOTE — Telephone Encounter (Signed)
Dr. Keane Scrape last note only states that the pt should follow up in 75yr with [facility] so I don't think PSA is needed. Thanks

## 2023-04-16 ENCOUNTER — Other Ambulatory Visit: Payer: Medicare Other

## 2023-04-20 ENCOUNTER — Ambulatory Visit: Payer: Medicare Other | Admitting: Urology

## 2023-06-10 ENCOUNTER — Telehealth: Payer: Self-pay

## 2023-06-10 DIAGNOSIS — Z0000 Encounter for general adult medical examination without abnormal findings: Secondary | ICD-10-CM

## 2023-06-10 DIAGNOSIS — N138 Other obstructive and reflux uropathy: Secondary | ICD-10-CM

## 2023-06-10 DIAGNOSIS — I480 Paroxysmal atrial fibrillation: Secondary | ICD-10-CM

## 2023-06-10 DIAGNOSIS — R972 Elevated prostate specific antigen [PSA]: Secondary | ICD-10-CM

## 2023-06-10 DIAGNOSIS — I129 Hypertensive chronic kidney disease with stage 1 through stage 4 chronic kidney disease, or unspecified chronic kidney disease: Secondary | ICD-10-CM

## 2023-06-10 DIAGNOSIS — E038 Other specified hypothyroidism: Secondary | ICD-10-CM

## 2023-06-10 DIAGNOSIS — R7309 Other abnormal glucose: Secondary | ICD-10-CM

## 2023-06-10 DIAGNOSIS — E782 Mixed hyperlipidemia: Secondary | ICD-10-CM

## 2023-06-10 NOTE — Telephone Encounter (Signed)
Lab Orders are in  Saralyn Pilar, DO [facility] 06/10/2023, 3:54 PM

## 2023-06-10 NOTE — Telephone Encounter (Signed)
Patient is schedule for a CPE and would like labs prior, please order.

## 2023-06-17 ENCOUNTER — Encounter: Payer: Medicare Other | Admitting: Family Medicine

## 2023-06-22 ENCOUNTER — Other Ambulatory Visit: Payer: Medicare Other

## 2023-06-22 DIAGNOSIS — R972 Elevated prostate specific antigen [PSA]: Secondary | ICD-10-CM

## 2023-06-22 DIAGNOSIS — Z0000 Encounter for general adult medical examination without abnormal findings: Secondary | ICD-10-CM

## 2023-06-22 DIAGNOSIS — N401 Enlarged prostate with lower urinary tract symptoms: Secondary | ICD-10-CM

## 2023-06-22 DIAGNOSIS — R7309 Other abnormal glucose: Secondary | ICD-10-CM

## 2023-06-22 DIAGNOSIS — I129 Hypertensive chronic kidney disease with stage 1 through stage 4 chronic kidney disease, or unspecified chronic kidney disease: Secondary | ICD-10-CM

## 2023-06-22 DIAGNOSIS — E782 Mixed hyperlipidemia: Secondary | ICD-10-CM

## 2023-06-22 DIAGNOSIS — E038 Other specified hypothyroidism: Secondary | ICD-10-CM

## 2023-06-22 DIAGNOSIS — I480 Paroxysmal atrial fibrillation: Secondary | ICD-10-CM

## 2023-06-23 LAB — COMPLETE METABOLIC PANEL WITH GFR
AG Ratio: 1.6 (calc) (ref 1.0–2.5)
ALT: 28 U/L (ref 9–46)
AST: 26 U/L (ref 10–35)
Albumin: 3.7 g/dL (ref 3.6–5.1)
Alkaline phosphatase (APISO): 88 U/L (ref 35–144)
BUN: 17 mg/dL (ref 7–25)
CO2: 28 mmol/L (ref 20–32)
Calcium: 9.1 mg/dL (ref 8.6–10.3)
Chloride: 110 mmol/L (ref 98–110)
Creat: 1.19 mg/dL (ref 0.70–1.28)
Globulin: 2.3 g/dL (calc) (ref 1.9–3.7)
Glucose, Bld: 101 mg/dL — ABNORMAL HIGH (ref 65–99)
Potassium: 3.9 mmol/L (ref 3.5–5.3)
Sodium: 144 mmol/L (ref 135–146)
Total Bilirubin: 0.6 mg/dL (ref 0.2–1.2)
Total Protein: 6 g/dL — ABNORMAL LOW (ref 6.1–8.1)
eGFR: 63 mL/min/{1.73_m2} (ref >=60)

## 2023-06-23 LAB — HEMOGLOBIN A1C
Hgb A1c MFr Bld: 5.6 % of total Hgb (ref <5.7)
Mean Plasma Glucose: 114 mg/dL
eAG (mmol/L): 6.3 mmol/L

## 2023-06-23 LAB — CBC WITH DIFFERENTIAL/PLATELET
Absolute Monocytes: 396 cells/uL (ref 200–950)
Basophils Absolute: 40 cells/uL (ref 0–200)
Basophils Relative: 1 %
Eosinophils Absolute: 92 cells/uL (ref 15–500)
Eosinophils Relative: 2.3 %
HCT: 39.2 % (ref 38.5–50.0)
Hemoglobin: 13.5 g/dL (ref 13.2–17.1)
Lymphs Abs: 1356 cells/uL (ref 850–3900)
MCH: 31.4 pg (ref 27.0–33.0)
MCHC: 34.4 g/dL (ref 32.0–36.0)
MCV: 91.2 fL (ref 80.0–100.0)
MPV: 10.9 fL (ref 7.5–12.5)
Monocytes Relative: 9.9 %
Neutro Abs: 2116 cells/uL (ref 1500–7800)
Neutrophils Relative %: 52.9 %
Platelets: 109 10*3/uL — ABNORMAL LOW (ref 140–400)
RBC: 4.3 10*6/uL (ref 4.20–5.80)
RDW: 14.2 % (ref 11.0–15.0)
Total Lymphocyte: 33.9 %
WBC: 4 10*3/uL (ref 3.8–10.8)

## 2023-06-23 LAB — LIPID PANEL
Cholesterol: 138 mg/dL (ref <200)
HDL: 53 mg/dL (ref >=40)
LDL Cholesterol (Calc): 72 mg/dL (calc)
Non-HDL Cholesterol (Calc): 85 mg/dL (calc) (ref <130)
Total CHOL/HDL Ratio: 2.6 (calc) (ref <5.0)
Triglycerides: 58 mg/dL (ref <150)

## 2023-06-23 LAB — T4, FREE: Free T4: 0.8 ng/dL (ref 0.8–1.8)

## 2023-06-23 LAB — TSH: TSH: 0.47 mIU/L (ref 0.40–4.50)

## 2023-06-23 LAB — PSA: PSA: 4.57 ng/mL — ABNORMAL HIGH (ref <=4.00)

## 2023-06-28 ENCOUNTER — Other Ambulatory Visit: Payer: Self-pay | Admitting: Cardiovascular Disease

## 2023-06-28 ENCOUNTER — Other Ambulatory Visit: Payer: Medicare Other

## 2023-06-28 DIAGNOSIS — E785 Hyperlipidemia, unspecified: Secondary | ICD-10-CM

## 2023-06-28 DIAGNOSIS — I10 Essential (primary) hypertension: Secondary | ICD-10-CM

## 2023-06-28 NOTE — Telephone Encounter (Signed)
Pt scheduled on 9/3 with [facility]

## 2023-06-28 NOTE — Telephone Encounter (Signed)
Hi,  Could you schedule this patient a 6 month follow up visit? The patient was last seen on 02-05-2023. Thank you so much.

## 2023-07-01 ENCOUNTER — Encounter: Payer: Self-pay | Admitting: Family Medicine

## 2023-07-01 ENCOUNTER — Ambulatory Visit (INDEPENDENT_AMBULATORY_CARE_PROVIDER_SITE_OTHER): Payer: Medicare Other | Admitting: Family Medicine

## 2023-07-01 ENCOUNTER — Other Ambulatory Visit: Payer: Self-pay | Admitting: Family Medicine

## 2023-07-01 VITALS — BP 118/68 | HR 57 | Temp 98.0°F | Ht 71.0 in | Wt 176.4 lb

## 2023-07-01 DIAGNOSIS — H6993 Unspecified Eustachian tube disorder, bilateral: Secondary | ICD-10-CM

## 2023-07-01 DIAGNOSIS — N138 Other obstructive and reflux uropathy: Secondary | ICD-10-CM

## 2023-07-01 DIAGNOSIS — I129 Hypertensive chronic kidney disease with stage 1 through stage 4 chronic kidney disease, or unspecified chronic kidney disease: Secondary | ICD-10-CM

## 2023-07-01 DIAGNOSIS — Z0000 Encounter for general adult medical examination without abnormal findings: Secondary | ICD-10-CM

## 2023-07-01 DIAGNOSIS — E038 Other specified hypothyroidism: Secondary | ICD-10-CM | POA: Diagnosis not present

## 2023-07-01 DIAGNOSIS — R7309 Other abnormal glucose: Secondary | ICD-10-CM

## 2023-07-01 DIAGNOSIS — N401 Enlarged prostate with lower urinary tract symptoms: Secondary | ICD-10-CM

## 2023-07-01 DIAGNOSIS — Z7901 Long term (current) use of anticoagulants: Secondary | ICD-10-CM

## 2023-07-01 DIAGNOSIS — I480 Paroxysmal atrial fibrillation: Secondary | ICD-10-CM

## 2023-07-01 DIAGNOSIS — R972 Elevated prostate specific antigen [PSA]: Secondary | ICD-10-CM

## 2023-07-01 DIAGNOSIS — N182 Chronic kidney disease, stage 2 (mild): Secondary | ICD-10-CM

## 2023-07-01 DIAGNOSIS — E782 Mixed hyperlipidemia: Secondary | ICD-10-CM

## 2023-07-01 NOTE — Assessment & Plan Note (Signed)
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

## 2023-07-01 NOTE — Assessment & Plan Note (Signed)
Clinically stable Remains off levothyroxine Can re-check TSH Free T4 yearly 

## 2023-07-01 NOTE — Progress Notes (Signed)
Subjective:    Patient ID: Andrew Benson, male    DOB: [birth year], 77 y.o.   MRN: [id number]  Andrew Benson is a 77 y.o. male presenting on 07/01/2023 for Annual Exam   HPI  Here for Annual Physical and Lab Review  CHRONIC HTN / CKD-II Paroxysmal Atrial Fibrillation Followed by Cardiology On anticoagulation Eliquis 5mg  TWICE A DAY Not taking Amiodarone, has if AS NEEDED need    Last lab Creatinine maintained level 1.2-1.3 range Checks BP often at home, low normal readings in morning. Current Meds - Lisinopril 2.5mg  daily and has Metoprolol 25mg  BID PRN ONLY Afib Off ASA Reports good compliance, took meds today. Tolerating well, w/o complaints. Denies CP, dyspnea, HA, edema, dizziness / lightheadedness, bleeding problem or bruising     History of Central Hypothyroidism - Followed previously by [facility] Endocrinology Dr Tedd Sias back in 2018 He had Low TSH, Low T3, and Low-normal T4, and low-normal update on thyroid scan. Other hormones pituitary normal. He was taken off of low dose Levothyroxine. - Last lab done 08/2020- slight low TSH but normal Free T4. Remains off levothyroxine - Today doing well no new concerns.   Elevated PSA / BPH Followed by Urology [facility]  Elevated A1c 5.6, prior 5.3 to 5.5   HYPERLIPIDEMIA: - Reports no concerns. Last lipid panel 06/2023, controlled on statin - Currently taking Rosuvastatin 20mg , tolerating well without side effects or myalgias   Thrombocytopenia, chronic Trend of CBC with PLT result 109 now prior 120-140s mild low in past 7 years, he has no bleeding or bruising.     Health Maintenance:  Future Shingrix vaccine x2 at pharmacy.  Colonoscopy due next 2025     07/01/2023    9:18 AM 02/03/2023    2:45 PM 12/04/2022    8:11 AM  Depression screen PHQ 2/9  Decreased Interest 0 0 0  Down, Depressed, Hopeless 0 0 0  PHQ - 2 Score 0 0 0  Altered sleeping 1 0 0  Tired, decreased energy 0 0 0  Change in appetite 0 0 0  Feeling bad or failure  about yourself  0 0 0  Trouble concentrating 0 0 0  Moving slowly or fidgety/restless 0 0 0  Suicidal thoughts 0 0 0  PHQ-9 Score 1 0 0  Difficult doing work/chores Not difficult at all Not difficult at all Not difficult at all    Past Medical History:  Diagnosis Date   Asthma    CKD (chronic kidney disease), stage II    Diastolic dysfunction    a. 12/2022 Echo: EF 55-60%, no rwma, GrI DD, mild LVH, nl RV fxn, Ao sclerosis. Ao root 40mm.   Dilated aortic root (HCC)    a. 12/2022 Echo: Ao root 40mm.   Essential hypertension    History of cardiovascular stress test    a. 12/2022 Ex MV: EF 34%, freq PVCs, no ST/T changes, HTN response (211/98), no isch/infarct. CT attenuation images w/o significant Ao or Cor atherosclerosis.   Hyperlipidemia    NSVT (nonsustained ventricular tachycardia) (HCC)    PAF (paroxysmal atrial fibrillation) (HCC)    a. CHA2DS2VASc = 3;  b. takes amio/eliquis/metoprolol prn for PAF.   Palpitations    a. 11/2022 Zio: Predominantly sinus rhythm at 65 (40-255).  4 runs of NSVT, longest 8 beats (162), fastest 255 (6 beats). 8 SVT runs, longests 15 beats (127), fastest 143 5 beats). Rare PACs/PVCs.   PSVT (paroxysmal supraventricular tachycardia)    TMJ locking    Past  Surgical History:  Procedure Laterality Date   COLONOSCOPY WITH PROPOFOL N/A 12/29/2017   Procedure: COLONOSCOPY WITH PROPOFOL;  Surgeon: Scot Jun, MD;  Location: [facility] ENDOSCOPY;  Service: Endoscopy;  Laterality: N/A;   COLONOSCOPY WITH PROPOFOL N/A 07/02/2021   Procedure: COLONOSCOPY WITH PROPOFOL;  Surgeon: Toledo, Boykin Nearing, MD;  Location: [facility] ENDOSCOPY;  Service: Gastroenterology;  Laterality: N/A;   FINGER SURGERY     left index finger   INGUINAL HERNIA REPAIR Right 2007   INGUINAL HERNIA REPAIR Left 2008   done x 2   KNEE SURGERY     left    Social History   Socioeconomic History   Marital status: Married    Spouse name: Not on file   Number of children: Not on file   Years of  education: Not on file   Highest education level: Associate degree: academic program  Occupational History   Not on file  Tobacco Use   Smoking status: Former    Current packs/day: 0.00    Average packs/day: 1 pack/day for 20.0 years (20.0 ttl pk-yrs)    Types: Cigarettes    Start date: 02/01/1973    Quit date: 02/01/1993    Years since quitting: 30.4    Passive exposure: Past   Smokeless tobacco: Never  Vaping Use   Vaping status: Never Used  Substance and Sexual Activity   Alcohol use: Not Currently    Comment: social drinker.   Drug use: No   Sexual activity: Not on file  Other Topics Concern   Not on file  Social History Narrative   Participates in church activities, and food bank 3 days a week   Gym 2 times weekly- treadmill    Social Determinants of Health   Financial Resource Strain: Low Risk  (12/04/2022)   Overall Financial Resource Strain (CARDIA)    Difficulty of Paying Living Expenses: Not hard at all  Food Insecurity: No Food Insecurity (12/04/2022)   Hunger Vital Sign    Worried About Running Out of Food in the Last Year: Never true    Ran Out of Food in the Last Year: Never true  Transportation Needs: No Transportation Needs (12/04/2022)   PRAPARE - Administrator, Civil Service (Medical): No    Lack of Transportation (Non-Medical): No  Physical Activity: Sufficiently Active (12/04/2022)   Exercise Vital Sign    Days of Exercise per Week: 5 days    Minutes of Exercise per Session: 120 min  Stress: No Stress Concern Present (12/04/2022)   [employer] of Occupational Health - Occupational Stress Questionnaire    Feeling of Stress : Not at all  Social Connections: Moderately Integrated (12/04/2022)   Social Connection and Isolation Panel [NHANES]    Frequency of Communication with Friends and Family: More than three times a week    Frequency of Social Gatherings with Friends and Family: More than three times a week    Attends Religious  Services: More than 4 times per year    Active Member of [employer] or Organizations: No    Attends Banker Meetings: Never    Marital Status: Married  Catering manager Violence: Not At Risk (12/04/2022)   Humiliation, Afraid, Rape, and Kick questionnaire    Fear of Current or Ex-Partner: No    Emotionally Abused: No    Physically Abused: No    Sexually Abused: No   Family History  Problem Relation Age of Onset   Hyperlipidemia Mother    Hypertension  Mother    Heart disease Mother    Heart disease Sister        valve repair    Diabetes Brother    Hypertension Brother    Current Outpatient Medications on File Prior to Visit  Medication Sig   apixaban (ELIQUIS) 5 MG TABS tablet Take 1 tablet (5 mg total) by mouth 2 (two) times daily.   fluticasone (FLONASE) 50 MCG/ACT nasal spray PLACE 2 SPRAYS INTO BOTH NOSTRILS DAILY. USE FOR 4-6 WEEKS THEN STOP AND USE SEASONALLY OR AS NEEDED   lisinopril (ZESTRIL) 2.5 MG tablet TAKE 1 TABLET BY MOUTH EVERY DAY   metoprolol tartrate (LOPRESSOR) 25 MG tablet Take 1 tablet (25 mg total) by mouth 2 (two) times daily.   Multiple Vitamin (MULTIVITAMIN) capsule Take 1 capsule by mouth daily.   rosuvastatin (CRESTOR) 20 MG tablet TAKE 1 TABLET BY MOUTH EVERY DAY   tadalafil (CIALIS) 10 MG tablet Take 10mg  daily, with an additional 10mg  dose as needed 1 hour prior to sexual activity   tamsulosin (FLOMAX) 0.4 MG CAPS capsule Take 1 capsule (0.4 mg total) by mouth daily.   amiodarone (PACERONE) 200 MG tablet TAKE 1 TABLET (200 MG TOTAL) BY MOUTH DAILY AS NEEDED (TAKE FOR A FIB). (Patient not taking: Reported on 07/01/2023)   No current facility-administered medications on file prior to visit.    Review of Systems  Constitutional:  Negative for activity change, appetite change, chills, diaphoresis, fatigue and fever.  HENT:  Negative for congestion and hearing loss.   Eyes:  Negative for visual disturbance.  Respiratory:  Negative for cough,  chest tightness, shortness of breath and wheezing.   Cardiovascular:  Negative for chest pain, palpitations and leg swelling.  Gastrointestinal:  Negative for abdominal pain, constipation, diarrhea, nausea and vomiting.  Genitourinary:  Negative for dysuria, frequency and hematuria.  Musculoskeletal:  Negative for arthralgias and neck pain.  Skin:  Negative for rash.  Neurological:  Negative for dizziness, weakness, light-headedness, numbness and headaches.  Hematological:  Negative for adenopathy.  Psychiatric/Behavioral:  Negative for behavioral problems, dysphoric mood and sleep disturbance.    Per HPI unless specifically indicated above     Objective:    BP 118/68 (BP Location: Left Arm, Patient Position: Sitting, Cuff Size: Normal)   Pulse (!) 57   Temp 98 F (36.7 C) (Oral)   Ht 5\' 11"  (1.803 m)   Wt 176 lb 6.4 oz (80 kg)   SpO2 100%   BMI 24.60 kg/m   Wt Readings from Last 3 Encounters:  07/01/23 176 lb 6.4 oz (80 kg)  02/15/23 180 lb (81.6 kg)  02/10/23 181 lb (82.1 kg)    Physical Exam Vitals and nursing note reviewed.  Constitutional:      General: He is not in acute distress.    Appearance: He is well-developed. He is not diaphoretic.     Comments: Well-appearing, comfortable, cooperative  HENT:     Head: Normocephalic and atraumatic.     Right Ear: Ear canal and external ear normal. There is no impacted cerumen.     Left Ear: Tympanic membrane, ear canal and external ear normal. There is no impacted cerumen.     Ears:     Comments: R TM mild effusion clear Eyes:     General:        Right eye: No discharge.        Left eye: No discharge.     Conjunctiva/sclera: Conjunctivae normal.     Pupils: Pupils  are equal, round, and reactive to light.  Neck:     Thyroid: No thyromegaly.     Vascular: No carotid bruit.  Cardiovascular:     Rate and Rhythm: Normal rate and regular rhythm.     Pulses: Normal pulses.     Heart sounds: Normal heart sounds. No murmur  heard. Pulmonary:     Effort: Pulmonary effort is normal. No respiratory distress.     Breath sounds: Normal breath sounds. No wheezing or rales.  Abdominal:     General: Bowel sounds are normal. There is no distension.     Palpations: Abdomen is soft. There is no mass.     Tenderness: There is no abdominal tenderness.  Musculoskeletal:        General: No tenderness. Normal range of motion.     Cervical back: Normal range of motion and neck supple.     Right lower leg: No edema.     Left lower leg: No edema.     Comments: Upper / Lower Extremities: - Normal muscle tone, strength bilateral upper extremities 5/5, lower extremities 5/5  Lymphadenopathy:     Cervical: No cervical adenopathy.  Skin:    General: Skin is warm and dry.     Findings: No erythema or rash.  Neurological:     Mental Status: He is alert and oriented to person, place, and time.     Comments: Distal sensation intact to light touch all extremities  Psychiatric:        Mood and Affect: Mood normal.        Behavior: Behavior normal.        Thought Content: Thought content normal.     Comments: Well groomed, good eye contact, normal speech and thoughts      Results for orders placed or performed in visit on 06/22/23  T4, free  Result Value Ref Range   Free T4 0.8 0.8 - 1.8 ng/dL  TSH  Result Value Ref Range   TSH 0.47 0.40 - 4.50 mIU/L  PSA  Result Value Ref Range   PSA 4.57 (H) < OR = 4.00 ng/mL  Lipid panel  Result Value Ref Range   Cholesterol 138 <200 mg/dL   HDL 53 > OR = 40 mg/dL   Triglycerides 58 <161 mg/dL   LDL Cholesterol (Calc) 72 mg/dL (calc)   Total CHOL/HDL Ratio 2.6 <5.0 (calc)   Non-HDL Cholesterol (Calc) 85 <096 mg/dL (calc)  Hemoglobin E4V  Result Value Ref Range   Hgb A1c MFr Bld 5.6 <5.7 % of total Hgb   Mean Plasma Glucose 114 mg/dL   eAG (mmol/L) 6.3 mmol/L  CBC with Differential/Platelet  Result Value Ref Range   WBC 4.0 3.8 - 10.8 Thousand/uL   RBC 4.30 4.20 - 5.80  Million/uL   Hemoglobin 13.5 13.2 - 17.1 g/dL   HCT 40.9 81.1 - 91.4 %   MCV 91.2 80.0 - 100.0 fL   MCH 31.4 27.0 - 33.0 pg   MCHC 34.4 32.0 - 36.0 g/dL   RDW 78.2 95.6 - 21.3 %   Platelets 109 (L) 140 - 400 Thousand/uL   MPV 10.9 7.5 - 12.5 fL   Neutro Abs 2,116 1,500 - 7,800 cells/uL   Lymphs Abs 1,356 850 - 3,900 cells/uL   Absolute Monocytes 396 200 - 950 cells/uL   Eosinophils Absolute 92 15 - 500 cells/uL   Basophils Absolute 40 0 - 200 cells/uL   Neutrophils Relative % 52.9 %   Total Lymphocyte 33.9 %  Monocytes Relative 9.9 %   Eosinophils Relative 2.3 %   Basophils Relative 1.0 %  COMPLETE METABOLIC PANEL WITH GFR  Result Value Ref Range   Glucose, Bld 101 (H) 65 - 99 mg/dL   BUN 17 7 - 25 mg/dL   Creat 8.46 9.62 - 9.52 mg/dL   eGFR 63 > OR = 60 WU/XLK/4.40N0   BUN/Creatinine Ratio SEE NOTE: 6 - 22 (calc)   Sodium 144 135 - 146 mmol/L   Potassium 3.9 3.5 - 5.3 mmol/L   Chloride 110 98 - 110 mmol/L   CO2 28 20 - 32 mmol/L   Calcium 9.1 8.6 - 10.3 mg/dL   Total Protein 6.0 (L) 6.1 - 8.1 g/dL   Albumin 3.7 3.6 - 5.1 g/dL   Globulin 2.3 1.9 - 3.7 g/dL (calc)   AG Ratio 1.6 1.0 - 2.5 (calc)   Total Bilirubin 0.6 0.2 - 1.2 mg/dL   Alkaline phosphatase (APISO) 88 35 - 144 U/L   AST 26 10 - 35 U/L   ALT 28 9 - 46 U/L      Assessment & Plan:   Problem List Items Addressed This Visit     Benign hypertension with CKD (chronic kidney disease), stage II    Well-controlled HTN Creatinine lab stable 1.19 improved - Home BP readings stable  Complication CKD-II, PAFib    Plan:  1. Continue current BP regimen - Lisinopril 2.5mg  for renal protection 2. Encourage improved lifestyle - low sodium diet, regular exercise 3. Continue monitor BP outside office, bring readings to next visit, if persistently >140/90 or new symptoms notify office sooner      Benign localized hyperplasia of prostate with urinary obstruction    BPH controlled on alpha blocker Followed by [facility]  Urology Monitoring Elevated PSA, now improved      Central hypothyroidism    Clinically stable Remains off levothyroxine Can re-check TSH Free T4 yearly      Chronic anticoagulation   Paroxysmal atrial fibrillation (HCC)    Stable, controlled Paroxysmal Atrial Fibrillation, >25 yr Followed by Cardiology Dr Mariah Milling On Eliquis 5 TWICE A DAY Off Aspirin      Other Visit Diagnoses     Annual physical exam    -  Primary   Eustachian tube dysfunction, bilateral           Updated Health Maintenance information Reviewed recent lab results with patient Encouraged improvement to lifestyle with diet and exercise Goal of weight loss  SHingrix Vaccine at the pharmacy 2 doses 2-6 months apart, free of charge at pharmacy  Platelet mild reduced, but similar to before.  A1c 5.6 slightly elevated but below range of pre diabetes  Lipid panel shows normal cholesterol well controlled  PSA 4.5 improved from last year >5 Follow up with Urology as planned for BPH management and prostate surveillance.  Chemistry shows improved kidney function. CKD II now instead of III  No orders of the defined types were placed in this encounter.    No orders of the defined types were placed in this encounter.    Follow up plan: Return in about 1 year (around 06/30/2024) for 1 year fasting lab only then 1 week later Annual Physical early AM.  Future labs 06/28/23  Saralyn Pilar, DO [facility] 07/01/2023, 9:05 AM

## 2023-07-01 NOTE — Patient Instructions (Addendum)
Thank you for coming to the office today.  SHingrix Vaccine at the pharmacy 2 doses 2-6 months apart, free of charge at pharmacy  Platelet mild reduced, but similar to before.  A1c 5.6 slightly elevated but below range of pre diabetes  Lipid panel shows normal cholesterol well controlled  PSA 4.5 improved from last year >5  Chemistry shows improved kidney function. CKD II now instead of III   DUE for FASTING BLOOD WORK food or drink after midnight before the lab appointment, only water or coffee without cream/sugar on the morning of)  SCHEDULE "Lab Only" visit in the morning at the clinic for lab draw in 1 YEAR  - Make sure Lab Only appointment is at about 1 week before your next appointment, so that results will be available  For Lab Results, once available within 2-3 days of blood draw, you can can log in to MyChart online to view your results and a brief explanation. Also, we can discuss results at next follow-up visit.   Please schedule a Follow-up Appointment to: Return in about 1 year (around 06/30/2024) for 1 year fasting lab only then 1 week later Annual Physical early AM.  If you have any other questions or concerns, please feel free to call the office or send a message through MyChart. You may also schedule an earlier appointment if necessary.  Additionally, you may be receiving a survey about your experience at our office within a few days to 1 week by e-mail or mail. We value your feedback.  Saralyn Pilar, DO [facility], [place]

## 2023-07-01 NOTE — Assessment & Plan Note (Signed)
Well-controlled HTN Creatinine lab stable 1.19 improved - Home BP readings stable  Complication CKD-II, PAFib    Plan:  1. Continue current BP regimen - Lisinopril 2.5mg  for renal protection 2. Encourage improved lifestyle - low sodium diet, regular exercise 3. Continue monitor BP outside office, bring readings to next visit, if persistently >140/90 or new symptoms notify office sooner

## 2023-07-01 NOTE — Assessment & Plan Note (Signed)
BPH controlled on alpha blocker Followed by [facility] Urology Monitoring Elevated PSA, now improved

## 2023-08-11 ENCOUNTER — Ambulatory Visit: Payer: Self-pay | Admitting: *Deleted

## 2023-08-11 ENCOUNTER — Encounter: Payer: Self-pay | Admitting: Family Medicine

## 2023-08-11 ENCOUNTER — Telehealth (INDEPENDENT_AMBULATORY_CARE_PROVIDER_SITE_OTHER): Payer: Medicare Other | Admitting: Family Medicine

## 2023-08-11 DIAGNOSIS — U071 COVID-19: Secondary | ICD-10-CM

## 2023-08-11 NOTE — Telephone Encounter (Signed)
Summary: positive covid test   Pt states that he tested positive for covid yesterday. Pt is congested, runny nose, cough, and phlegm. Pt would like to see if PCP can call in medication for him. Please advise.  [id number] [place], [place] - [address] Phone: [phone] Fax: [phone]        Called patient #[phone] to review sx of covid positive . No answer, LVMTCB [phone].

## 2023-08-11 NOTE — Telephone Encounter (Signed)
Apt schedule. Pt advised.    Thanks,   -Vernona Rieger

## 2023-08-11 NOTE — Patient Instructions (Signed)
Thank you for coming to the office today.    Please schedule a Follow-up Appointment to: No follow-ups on file.  If you have any other questions or concerns, please feel free to call the office or send a message through MyChart. You may also schedule an earlier appointment if necessary.  Additionally, you may be receiving a survey about your experience at our office within a few days to 1 week by e-mail or mail. We value your feedback.  Saralyn Pilar, DO [facility], [place]

## 2023-08-11 NOTE — Progress Notes (Unsigned)
Subjective:    Patient ID: Andrew Benson, male    DOB: 1946-03-16, 77 y.o.   MRN: [id number]  Andrew Benson is a 77 y.o. male presenting on 08/11/2023 for No chief complaint on file.  Virtual / Telehealth Encounter - Video Visit via MyChart The purpose of this virtual visit is to provide medical care while limiting exposure to the novel coronavirus (COVID19) for both patient and office staff.  Consent was obtained for remote visit:  Yes.   Answered questions that patient had about telehealth interaction:  Yes.   I discussed the limitations, risks, security and privacy concerns of performing an evaluation and management service by video/telephone. I also discussed with the patient that there may be a patient responsible charge related to this service. The patient expressed understanding and agreed to proceed.  Patient Location: Home Provider Location: [facility] (Office)  Participants in virtual visit: - Patient: Lowen "Chuck" Racanelli - CMA: Kavin Leech CMA - Provider: Dr Althea Charon   HPI    Chief Complaint: Covid positive Symptoms: Dry cough, fatigue,subjective fever (chills, sweating last night) chest congestion Frequency: Symptoms started Monday, tested yesterday  ***  Health Maintenance: ***     07/01/2023    9:18 AM 02/03/2023    2:45 PM 12/04/2022    8:11 AM  Depression screen PHQ 2/9  Decreased Interest 0 0 0  Down, Depressed, Hopeless 0 0 0  PHQ - 2 Score 0 0 0  Altered sleeping 1 0 0  Tired, decreased energy 0 0 0  Change in appetite 0 0 0  Feeling bad or failure about yourself  0 0 0  Trouble concentrating 0 0 0  Moving slowly or fidgety/restless 0 0 0  Suicidal thoughts 0 0 0  PHQ-9 Score 1 0 0  Difficult doing work/chores Not difficult at all Not difficult at all Not difficult at all    Social History   Tobacco Use   Smoking status: Former    Current packs/day: 0.00    Average packs/day: 1 pack/day for 20.0 years (20.0 ttl  pk-yrs)    Types: Cigarettes    Start date: 02/01/1973    Quit date: 02/01/1993    Years since quitting: 30.5    Passive exposure: Past   Smokeless tobacco: Never  Vaping Use   Vaping status: Never Used  Substance Use Topics   Alcohol use: Not Currently    Comment: social drinker.   Drug use: No    Review of Systems Per HPI unless specifically indicated above     Objective:    There were no vitals taken for this visit.  Wt Readings from Last 3 Encounters:  07/01/23 176 lb 6.4 oz (80 kg)  02/15/23 180 lb (81.6 kg)  02/10/23 181 lb (82.1 kg)    Physical Exam  Note examination was completely remotely via video observation objective data only  Gen - well-appearing, no acute distress or apparent pain, comfortable HEENT - eyes appear clear without discharge or redness Heart/Lungs - cannot examine virtually - observed occasional cough Abd - cannot examine virtually  Skin - face visible today- no rash Neuro - awake, alert, oriented Psych - not anxious appearing    Results for orders placed or performed in visit on 06/22/23  T4, free  Result Value Ref Range   Free T4 0.8 0.8 - 1.8 ng/dL  TSH  Result Value Ref Range   TSH 0.47 0.40 - 4.50 mIU/L  PSA  Result Value Ref Range  PSA 4.57 (H) < OR = 4.00 ng/mL  Lipid panel  Result Value Ref Range   Cholesterol 138 <200 mg/dL   HDL 53 > OR = 40 mg/dL   Triglycerides 58 <952 mg/dL   LDL Cholesterol (Calc) 72 mg/dL (calc)   Total CHOL/HDL Ratio 2.6 <5.0 (calc)   Non-HDL Cholesterol (Calc) 85 <841 mg/dL (calc)  Hemoglobin L2G  Result Value Ref Range   Hgb A1c MFr Bld 5.6 <5.7 % of total Hgb   Mean Plasma Glucose 114 mg/dL   eAG (mmol/L) 6.3 mmol/L  CBC with Differential/Platelet  Result Value Ref Range   WBC 4.0 3.8 - 10.8 Thousand/uL   RBC 4.30 4.20 - 5.80 Million/uL   Hemoglobin 13.5 13.2 - 17.1 g/dL   HCT 40.1 02.7 - 25.3 %   MCV 91.2 80.0 - 100.0 fL   MCH 31.4 27.0 - 33.0 pg   MCHC 34.4 32.0 - 36.0 g/dL   RDW  66.4 40.3 - 47.4 %   Platelets 109 (L) 140 - 400 Thousand/uL   MPV 10.9 7.5 - 12.5 fL   Neutro Abs 2,116 1,500 - 7,800 cells/uL   Lymphs Abs 1,356 850 - 3,900 cells/uL   Absolute Monocytes 396 200 - 950 cells/uL   Eosinophils Absolute 92 15 - 500 cells/uL   Basophils Absolute 40 0 - 200 cells/uL   Neutrophils Relative % 52.9 %   Total Lymphocyte 33.9 %   Monocytes Relative 9.9 %   Eosinophils Relative 2.3 %   Basophils Relative 1.0 %  COMPLETE METABOLIC PANEL WITH GFR  Result Value Ref Range   Glucose, Bld 101 (H) 65 - 99 mg/dL   BUN 17 7 - 25 mg/dL   Creat 2.59 5.63 - 8.75 mg/dL   eGFR 63 > OR = 60 IE/PPI/9.51O8   BUN/Creatinine Ratio SEE NOTE: 6 - 22 (calc)   Sodium 144 135 - 146 mmol/L   Potassium 3.9 3.5 - 5.3 mmol/L   Chloride 110 98 - 110 mmol/L   CO2 28 20 - 32 mmol/L   Calcium 9.1 8.6 - 10.3 mg/dL   Total Protein 6.0 (L) 6.1 - 8.1 g/dL   Albumin 3.7 3.6 - 5.1 g/dL   Globulin 2.3 1.9 - 3.7 g/dL (calc)   AG Ratio 1.6 1.0 - 2.5 (calc)   Total Bilirubin 0.6 0.2 - 1.2 mg/dL   Alkaline phosphatase (APISO) 88 35 - 144 U/L   AST 26 10 - 35 U/L   ALT 28 9 - 46 U/L      Assessment & Plan:   Problem List Items Addressed This Visit   None Visit Diagnoses     COVID-19 virus infection    -  Primary       No orders of the defined types were placed in this encounter.     Follow up plan: No follow-ups on file.   Patient verbalizes understanding with the above medical recommendations including the limitation of remote medical advice.  Specific follow-up and call-back criteria were given for patient to follow-up or seek medical care more urgently if needed.  Total duration of direct patient care provided via video conference: 10 minutes   Saralyn Pilar, DO [facility] 08/11/2023, 5:08 PM

## 2023-08-11 NOTE — Telephone Encounter (Signed)
Left message advising pt Dr. Althea Charon is able to work him in with a virtual visit today at 4pm.  PEC please schedule if he calls back.    Thanks,   -Vernona Rieger

## 2023-08-11 NOTE — Telephone Encounter (Signed)
  Chief Complaint: Covid positive Symptoms: Dry cough, fatigue,subjective fever (chills, sweating last night) chest congestion Frequency: Symptoms started Monday, tested yesterday Pertinent Negatives: Patient denies SOB Disposition: [] ED /[] Urgent Care (no appt availability in office) / [] Appointment(In office/virtual)/ []  [facility]/ [] Home Care/ [] Refused Recommended Disposition /[] [facility]/ [x]  Follow-up with PCP Additional Notes:  Pt is interested in oral anti-viral, would like to discuss with PCP. No availability for VV today. Pt considered high risk. Care advise provided per protocol, self isolation guidelines reviewed, pt verbalizes understanding.  Please advise. Reason for Disposition  [1] HIGH RISK patient (e.g., weak immune system, age > 64 years, obesity with BMI 30 or higher, pregnant, chronic lung disease or other chronic medical condition) AND [2] COVID symptoms (e.g., cough, fever)  (Exceptions: Already seen by PCP and no new or worsening symptoms.)  Answer Assessment - Initial Assessment Questions 1. COVID-19 DIAGNOSIS: "How do you know that you have COVID?" (e.g., positive lab test or self-test, diagnosed by doctor or NP/PA, symptoms after exposure).     Home test, last evening 2. COVID-19 EXPOSURE: "Was there any known exposure to COVID before the symptoms began?" CDC Definition of close contact: within 6 feet (2 meters) for a total of 15 minutes or more over a 24-hour period.      no 3. ONSET: "When did the COVID-19 symptoms start?"      Monday 4. WORST SYMPTOM: "What is your worst symptom?" (e.g., cough, fever, shortness of breath, muscle aches)     Cough 5. COUGH: "Do you have a cough?" If Yes, ask: "How bad is the cough?"       Yes, dry 6. FEVER: "Do you have a fever?" If Yes, ask: "What is your temperature, how was it measured, and when did it start?"     Chills, sweating 7. RESPIRATORY STATUS: "Describe your breathing?" (e.g., normal;  shortness of breath, wheezing, unable to speak)      No 8. BETTER-SAME-WORSE: "Are you getting better, staying the same or getting worse compared to yesterday?"  If getting worse, ask, "In what way?"     Same 9. OTHER SYMPTOMS: "Do you have any other symptoms?"  (e.g., chills, fatigue, headache, loss of smell or taste, muscle pain, sore throat)     Fatigued 10. HIGH RISK DISEASE: "Do you have any chronic medical problems?" (e.g., asthma, heart or lung disease, weak immune system, obesity, etc.)        11. VACCINE: "Have you had the COVID-19 vaccine?" If Yes, ask: "Which one, how many shots, when did you get it?"       4 shots  Protocols used: Coronavirus (COVID-19) Diagnosed or Suspected-A-AH

## 2023-08-11 NOTE — Telephone Encounter (Signed)
Copied from CRM [id number]. Topic: Appointment Scheduling - Scheduling Inquiry for Clinic >> Aug 11, 2023  1:49 PM Andrew Benson wrote: Patient has called back in regards to latest message in chart regarding Dr Kirtland Bouchard will work him in at 4pm. [facility] does not have double booking privileges, attempted to reach the office and no answer. Patient is OK with being worked in for a virtual at [employer]. Please contact patient back to confirm this appt is made # [phone]

## 2023-08-11 NOTE — Telephone Encounter (Signed)
Okay to double book virtual vsit end of morning or afternoon today for COVID.  Saralyn Pilar, DO [facility] 08/11/2023, 9:19 AM

## 2023-08-23 NOTE — Progress Notes (Unsigned)
Cardiology Office Note  Date:  08/24/2023   ID:  Andrew Benson, DOB July 18, 1946, MRN [id number]  PCP:  Smitty Cords, DO   Chief Complaint  Patient presents with   6 month follow up     "Doing well." Medications reviewed by the patient verbally.     HPI:  Andrew Benson is a pleasant 77 year old gentleman with PMH of  remote history of smoking for 20 years,  atrial fibrillation  atrial fib 10/26/13  while he was on his tractor in 2015, developed acute onset of palpitations, presented to the emergency room  2019 episode in [place], 20 min who presents for routine followup for atrial fibrillation.   LOV with myself April 2023 Seen by one of our providers February 2024  Periodic low pressure Weight loss over the past year, 8 pounds 182 down to 174 today  Remains on metoprolol tartrate 25 twice daily with lisinopril 2.5, Flomax Periodic orthostasis symptoms  Denies having significant paroxysmal atrial fibrillation spells Taking amiodarone as needed Takes Eliquis faithfully twice daily  Continues to work at [employer] on a regular basis  EKG personally reviewed by myself on todays visit EKG Interpretation Date/Time:  Tuesday August 24 2023 09:59:55 EDT Ventricular Rate:  59 PR Interval:  146 QRS Duration:  88 QT Interval:  392 QTC Calculation: 388 R Axis:   52  Text Interpretation: Sinus bradycardia with sinus arrhythmia Low voltage QRS When compared with ECG of 05-Jan-2023 00:31, Premature ventricular complexes are no longer Present Criteria for Inferior infarct are no longer Present Nonspecific T wave abnormality has replaced inverted T waves in Inferior leads Confirmed by Julien Nordmann [phone]) on 08/24/2023 10:19:10 AM   Other past medical history reviewed December 2023 reported palpitations Zio monitor showing brief runs of nonsustained VT, PSVT  In January reported some shortness of breath on exertion  Seen in the ER January 05, 2023 for palpitations,  hypertension  Exercise Myoview January 2024 without ischemia Frequent PVCs, hypertensive to exercise  Echocardiogram with EF 55 to 60%   early 2019 had been in [place] visiting family, large coffee  developed profound tachycardia rate up to 200 bpm Lasted approximately 20 minutes before terminating Towards the tail end of the arrhythmia took metoprolol possibly amiodarone  At that time reported having paroxysmal episodes of tachycardia rate up to 130s at times   chads VASC  score of 2, age, blood pressure  PMH:   has a past medical history of Asthma, CKD (chronic kidney disease), stage II, Diastolic dysfunction, Dilated aortic root (HCC), Essential hypertension, History of cardiovascular stress test, Hyperlipidemia, NSVT (nonsustained ventricular tachycardia) (HCC), PAF (paroxysmal atrial fibrillation) (HCC), Palpitations, PSVT (paroxysmal supraventricular tachycardia), and TMJ locking.  PSH:    Past Surgical History:  Procedure Laterality Date   COLONOSCOPY WITH PROPOFOL N/A 12/29/2017   Procedure: COLONOSCOPY WITH PROPOFOL;  Surgeon: Scot Jun, MD;  Location: [facility] ENDOSCOPY;  Service: Endoscopy;  Laterality: N/A;   COLONOSCOPY WITH PROPOFOL N/A 07/02/2021   Procedure: COLONOSCOPY WITH PROPOFOL;  Surgeon: Toledo, Boykin Nearing, MD;  Location: [facility] ENDOSCOPY;  Service: Gastroenterology;  Laterality: N/A;   FINGER SURGERY     left index finger   INGUINAL HERNIA REPAIR Right 2007   INGUINAL HERNIA REPAIR Left 2008   done x 2   KNEE SURGERY     left     Current Outpatient Medications  Medication Sig Dispense Refill   apixaban (ELIQUIS) 5 MG TABS tablet Take 1 tablet (5 mg total) by mouth 2 (  two) times daily. 60 tablet 6   lisinopril (ZESTRIL) 2.5 MG tablet TAKE 1 TABLET BY MOUTH EVERY DAY 90 tablet 0   metoprolol tartrate (LOPRESSOR) 25 MG tablet Take 1 tablet (25 mg total) by mouth 2 (two) times daily. 180 tablet 3   Multiple Vitamin (MULTIVITAMIN) capsule Take 1 capsule by  mouth daily.     rosuvastatin (CRESTOR) 20 MG tablet TAKE 1 TABLET BY MOUTH EVERY DAY 90 tablet 0   tadalafil (CIALIS) 10 MG tablet Take 10mg  daily, with an additional 10mg  dose as needed 1 hour prior to sexual activity 90 tablet 3   tamsulosin (FLOMAX) 0.4 MG CAPS capsule Take 1 capsule (0.4 mg total) by mouth daily. 90 capsule 3   amiodarone (PACERONE) 200 MG tablet TAKE 1 TABLET (200 MG TOTAL) BY MOUTH DAILY AS NEEDED (TAKE FOR A FIB). (Patient not taking: Reported on 07/01/2023) 90 tablet 3   fluticasone (FLONASE) 50 MCG/ACT nasal spray PLACE 2 SPRAYS INTO BOTH NOSTRILS DAILY. USE FOR 4-6 WEEKS THEN STOP AND USE SEASONALLY OR AS NEEDED (Patient not taking: Reported on 08/24/2023) 48 mL 0   No current facility-administered medications for this visit.   Allergies:   Penicillins   Social History:  The patient  reports that he quit smoking about 30 years ago. His smoking use included cigarettes. He started smoking about 50 years ago. He has a 20 pack-year smoking history. He has been exposed to tobacco smoke. He has never used smokeless tobacco. He reports that he does not currently use alcohol. He reports that he does not use drugs.   Family History:   family history includes Diabetes in his brother; Heart disease in his mother and sister; Hyperlipidemia in his mother; Hypertension in his brother and mother.    Review of Systems: Review of Systems  Constitutional: Negative.   HENT: Negative.    Respiratory: Negative.    Cardiovascular: Negative.   Gastrointestinal: Negative.   Musculoskeletal: Negative.   Neurological: Negative.   Psychiatric/Behavioral: Negative.    All other systems reviewed and are negative.   PHYSICAL EXAM: VS:  BP 100/60 (BP Location: Left Arm, Patient Position: Sitting, Cuff Size: Normal)   Pulse (!) 59   Ht 5\' 11"  (1.803 m)   Wt 174 lb (78.9 kg)   SpO2 97%   BMI 24.27 kg/m  , BMI Body mass index is 24.27 kg/m. Constitutional:  oriented to person, place, and  time. No distress.  HENT:  Head: Grossly normal Eyes:  no discharge. No scleral icterus.  Neck: No JVD, no carotid bruits  Cardiovascular: Regular rate and rhythm, no murmurs appreciated Pulmonary/Chest: Clear to auscultation bilaterally, no wheezes or rails Abdominal: Soft.  no distension.  no tenderness.  Musculoskeletal: Normal range of motion Neurological:  normal muscle tone. Coordination normal. No atrophy Skin: Skin warm and dry Psychiatric: normal affect, pleasant   Recent Labs: 01/05/2023: Magnesium 2.2 06/22/2023: ALT 28; BUN 17; Creat 1.19; Hemoglobin 13.5; Platelets 109; Potassium 3.9; Sodium 144; TSH 0.47    Lipid Panel Lab Results  Component Value Date   CHOL 138 06/22/2023   HDL 53 06/22/2023   LDLCALC 72 06/22/2023   TRIG 58 06/22/2023      Wt Readings from Last 3 Encounters:  08/24/23 174 lb (78.9 kg)  07/01/23 176 lb 6.4 oz (80 kg)  02/15/23 180 lb (81.6 kg)     ASSESSMENT AND PLAN:  Paroxysmal atrial fibrillation (HCC) - Plan: EKG 12-Lead CHADS VASC of 2,  Eliquis 5 mg  twice daily Maintaining normal sinus rhythm No recent episodes of tachypalpitations Amiodarone as needed Continue metoprolol tartrate 25 twice daily on a regular basis  Mixed hyperlipidemia Tolerating Crestor Reports he is taking this every other day CT attenuation correction images with no significant aortic atherosclerosis or coronary calcification  Numbers at goal  Essential hypertension -  Blood pressure running low, recommended he hold the lisinopril Recent 8 pound weight loss, intentional  CKD (chronic kidney disease), stage II Stable  Other chest pain musculoskeletal pain in the past    Total encounter time more than 30 minutes  Greater than 50% was spent in counseling and coordination of care with the patient    Orders Placed This Encounter  Procedures   EKG 12-Lead     Signed, Dossie Arbour, M.D., Ph.D. 08/24/2023  [facility] [place],  [place] [phone]

## 2023-08-24 ENCOUNTER — Ambulatory Visit: Payer: Medicare Other | Attending: Cardiovascular Disease | Admitting: Cardiovascular Disease

## 2023-08-24 ENCOUNTER — Encounter: Payer: Self-pay | Admitting: Cardiovascular Disease

## 2023-08-24 VITALS — BP 100/60 | HR 59 | Ht 71.0 in | Wt 174.0 lb

## 2023-08-24 DIAGNOSIS — R55 Syncope and collapse: Secondary | ICD-10-CM

## 2023-08-24 DIAGNOSIS — I480 Paroxysmal atrial fibrillation: Secondary | ICD-10-CM | POA: Diagnosis not present

## 2023-08-24 DIAGNOSIS — E785 Hyperlipidemia, unspecified: Secondary | ICD-10-CM

## 2023-08-24 DIAGNOSIS — Z87891 Personal history of nicotine dependence: Secondary | ICD-10-CM

## 2023-08-24 DIAGNOSIS — N182 Chronic kidney disease, stage 2 (mild): Secondary | ICD-10-CM | POA: Diagnosis not present

## 2023-08-24 DIAGNOSIS — R0609 Other forms of dyspnea: Secondary | ICD-10-CM

## 2023-08-24 DIAGNOSIS — I10 Essential (primary) hypertension: Secondary | ICD-10-CM

## 2023-08-24 DIAGNOSIS — I471 Supraventricular tachycardia, unspecified: Secondary | ICD-10-CM

## 2023-08-24 DIAGNOSIS — R002 Palpitations: Secondary | ICD-10-CM

## 2023-08-24 DIAGNOSIS — E782 Mixed hyperlipidemia: Secondary | ICD-10-CM

## 2023-08-24 DIAGNOSIS — N1831 Chronic kidney disease, stage 3a: Secondary | ICD-10-CM

## 2023-08-24 MED ORDER — APIXABAN 5 MG PO TABS
5.0000 mg | ORAL_TABLET | Freq: Two times a day (BID) | ORAL | 3 refills | Status: DC
Start: 2023-08-24 — End: 2024-10-16

## 2023-08-24 MED ORDER — METOPROLOL TARTRATE 25 MG PO TABS: 25.0000 mg | ORAL_TABLET | Freq: Two times a day (BID) | ORAL | 3 refills | Status: DC

## 2023-08-24 MED ORDER — ROSUVASTATIN CALCIUM 20 MG PO TABS
20.0000 mg | ORAL_TABLET | Freq: Every day | ORAL | 3 refills | Status: DC
Start: 2023-08-24 — End: 2024-08-22

## 2023-08-24 MED ORDER — LISINOPRIL 2.5 MG PO TABS
ORAL_TABLET | ORAL | 3 refills | Status: DC
Start: 2023-08-24 — End: 2024-07-25

## 2023-08-24 NOTE — Patient Instructions (Addendum)
Medication Instructions:  Lisinopril 2.5 daily as needed for pressure >160  If you need a refill on your cardiac medications before your next appointment, please call your pharmacy.   Lab work: No new labs needed  Testing/Procedures: No new testing needed  Follow-Up: At [facility], you and your health needs are our priority.  As part of our continuing mission to provide you with exceptional heart care, we have created designated Provider Care Teams.  These Care Teams include your primary Cardiologist (physician) and Advanced Practice Providers (APPs -  Physician Assistants and Nurse Practitioners) who all work together to provide you with the care you need, when you need it.  You will need a follow up appointment in 12 months  Providers on your designated Care Team:   Nicolasa Ducking, NP Eula Listen, PA-C Cadence Fransico Michael, [place]  COVID-19 Vaccine Information can be found at: [email or web address] For questions related to vaccine distribution or appointments, please email [email or web address] or call [phone].

## 2023-09-01 ENCOUNTER — Telehealth: Payer: Self-pay | Admitting: Cardiovascular Disease

## 2023-09-01 NOTE — Telephone Encounter (Signed)
Pt c/o medication issue:  1. Name of Medication:  metoprolol tartrate (LOPRESSOR) 25 MG tablet  apixaban (ELIQUIS) 5 MG TABS tablet   2. How are you currently taking this medication (dosage and times per day)?  Take 1 tablet (25 mg total) by mouth 2 (two) times daily.   Take 1 tablet (5 mg total) by mouth 2 (two) times daily.    3. Are you having a reaction (difficulty breathing--STAT)? no  4. What is your medication issue? Patient states he has been feeling lightheaded, weak and tired.  He believes these medication are causing this.  He said he has tried taking it before and after meals and still has the same feeling.

## 2023-09-03 NOTE — Telephone Encounter (Signed)
Called and left a message for call back

## 2023-09-06 MED ORDER — METOPROLOL TARTRATE 25 MG PO TABS: 12.5000 mg | ORAL_TABLET | Freq: Two times a day (BID) | ORAL | Status: DC

## 2023-09-06 NOTE — Telephone Encounter (Signed)
Called and spoke with patient. Notified him of the following recommendation from Dr. Mariah Milling.   we can try to cut the metoprolol tartrate down to 12.5 twice daily If symptoms do not improve, potentially we could try a different beta-blocker Thx TGollan   Patient verbalizes understanding and states that he will try the lower dose.

## 2023-10-05 ENCOUNTER — Ambulatory Visit (INDEPENDENT_AMBULATORY_CARE_PROVIDER_SITE_OTHER): Payer: Medicare Other

## 2023-10-05 DIAGNOSIS — Z23 Encounter for immunization: Secondary | ICD-10-CM | POA: Diagnosis not present

## 2023-12-10 ENCOUNTER — Ambulatory Visit (INDEPENDENT_AMBULATORY_CARE_PROVIDER_SITE_OTHER): Payer: Medicare Other

## 2023-12-10 VITALS — BP 130/80 | Ht 71.0 in | Wt 180.2 lb

## 2023-12-10 DIAGNOSIS — Z0000 Encounter for general adult medical examination without abnormal findings: Secondary | ICD-10-CM

## 2023-12-10 NOTE — Patient Instructions (Addendum)
Andrew Benson , Thank you for taking time to come for your Medicare Wellness Visit. I appreciate your ongoing commitment to your health goals. Please review the following plan we discussed and let me know if I can assist you in the future.   Referrals/Orders/Follow-Ups/Clinician Recommendations: NONE  This is a list of the screening recommended for you and due dates:  Health Maintenance  Topic Date Due   DTaP/Tdap/Td vaccine (2 - Td or Tdap) 12/21/2017   COVID-19 Vaccine (4 - 2024-25 season) 08/22/2023   Zoster (Shingles) Vaccine (2 of 2) 11/22/2023   Colon Cancer Screening  07/02/2024   Medicare Annual Wellness Visit  12/09/2024   Pneumonia Vaccine  Completed   Flu Shot  Completed   Hepatitis C Screening  Completed   HPV Vaccine  Aged Out    Advanced directives: (ACP Link)Information on Advanced Care Planning can be found at [facility] [place] Advance Health Care Directives Advance Health Care Directives ([email or web address]   Next Medicare Annual Wellness Visit scheduled for next year: Yes   12/15/24 @ 8:10 AM IN PERSON

## 2023-12-10 NOTE — Progress Notes (Signed)
Subjective:   Andrew Benson is a 77 y.o. male who presents for Medicare Annual/Subsequent preventive examination.  Visit Complete: In person  Cardiac Risk Factors include: advanced age (>40men, >40 women);male gender;dyslipidemia;hypertension     Objective:    Today's Vitals   12/10/23 0809  BP: 130/80  Weight: 180 lb 3.2 oz (81.7 kg)  Height: 5\' 11"  (1.803 m)   Body mass index is 25.13 kg/m.     12/10/2023    8:20 AM 12/04/2022    8:14 AM 11/28/2021    8:26 AM 07/02/2021   12:21 PM 11/05/2020   10:00 AM 02/14/2019    2:31 PM 02/01/2018    9:40 AM  Advanced Directives  Does Patient Have a Medical Advance Directive? No No Yes No;Yes Yes Yes Yes  Type of Best boy of [place];Living will Healthcare Power of [place];Living will Healthcare Power of [place];Living will Living will;Healthcare Power of [employer] Power of [place];Living will  Does patient want to make changes to medical advance directive?   Yes (Inpatient - patient defers changing a medical advance directive and declines information at this time)      Copy of Healthcare Power of Attorney in Chart?   No - copy requested  No - copy requested No - copy requested No - copy requested  Would patient like information on creating a medical advance directive? No - Patient declined No - Patient declined         Current Medications (verified) Outpatient Encounter Medications as of 12/10/2023  Medication Sig   amiodarone (PACERONE) 200 MG tablet TAKE 1 TABLET (200 MG TOTAL) BY MOUTH DAILY AS NEEDED (TAKE FOR A FIB).   apixaban (ELIQUIS) 5 MG TABS tablet Take 1 tablet (5 mg total) by mouth 2 (two) times daily.   fluticasone (FLONASE) 50 MCG/ACT nasal spray PLACE 2 SPRAYS INTO BOTH NOSTRILS DAILY. USE FOR 4-6 WEEKS THEN STOP AND USE SEASONALLY OR AS NEEDED   metoprolol tartrate (LOPRESSOR) 25 MG tablet Take 0.5 tablets (12.5 mg total) by mouth 2 (two) times daily.   Multiple Vitamin  (MULTIVITAMIN) capsule Take 1 capsule by mouth daily.   rosuvastatin (CRESTOR) 20 MG tablet Take 1 tablet (20 mg total) by mouth daily.   tadalafil (CIALIS) 10 MG tablet Take 10mg  daily, with an additional 10mg  dose as needed 1 hour prior to sexual activity   tamsulosin (FLOMAX) 0.4 MG CAPS capsule Take 1 capsule (0.4 mg total) by mouth daily.   lisinopril (ZESTRIL) 2.5 MG tablet TAKE 1 TABLET BY MOUTH daily as needed for pressure >160 (Patient not taking: Reported on 12/10/2023)   No facility-administered encounter medications on file as of 12/10/2023.    Allergies (verified) Penicillins   History: Past Medical History:  Diagnosis Date   Asthma    CKD (chronic kidney disease), stage II    Diastolic dysfunction    a. 12/2022 Echo: EF 55-60%, no rwma, GrI DD, mild LVH, nl RV fxn, Ao sclerosis. Ao root 40mm.   Dilated aortic root (HCC)    a. 12/2022 Echo: Ao root 40mm.   Essential hypertension    History of cardiovascular stress test    a. 12/2022 Ex MV: EF 34%, freq PVCs, no ST/T changes, HTN response (211/98), no isch/infarct. CT attenuation images w/o significant Ao or Cor atherosclerosis.   Hyperlipidemia    NSVT (nonsustained ventricular tachycardia) (HCC)    PAF (paroxysmal atrial fibrillation) (HCC)    a. CHA2DS2VASc = 3;  b. takes amio/eliquis/metoprolol prn  for PAF.   Palpitations    a. 11/2022 Zio: Predominantly sinus rhythm at 65 (40-255).  4 runs of NSVT, longest 8 beats (162), fastest 255 (6 beats). 8 SVT runs, longests 15 beats (127), fastest 143 5 beats). Rare PACs/PVCs.   PSVT (paroxysmal supraventricular tachycardia) (HCC)    TMJ locking    Past Surgical History:  Procedure Laterality Date   COLONOSCOPY WITH PROPOFOL N/A 12/29/2017   Procedure: COLONOSCOPY WITH PROPOFOL;  Surgeon: Scot Jun, MD;  Location[facility] ENDOSCOPY;  Service: Endoscopy;  Laterality: N/A;   COLONOSCOPY WITH PROPOFOL N/A 07/02/2021   Procedure: COLONOSCOPY WITH PROPOFOL;  Surgeon: [place],  Boykin Nearing, MD;  Location: [facility] ENDOSCOPY;  Service: Gastroenterology;  Laterality: N/A;   FINGER SURGERY     left index finger   INGUINAL HERNIA REPAIR Right 2007   INGUINAL HERNIA REPAIR Left 2008   done x 2   KNEE SURGERY     left    Family History  Problem Relation Age of Onset   Hyperlipidemia Mother    Hypertension Mother    Heart disease Mother    Heart disease Sister        valve repair    Diabetes Brother    Hypertension Brother    Social History   Socioeconomic History   Marital status: Married    Spouse name: Not on file   Number of children: Not on file   Years of education: Not on file   Highest education level: Associate degree: academic program  Occupational History   Not on file  Tobacco Use   Smoking status: Former    Current packs/day: 0.00    Average packs/day: 1 pack/day for 20.0 years (20.0 ttl pk-yrs)    Types: Cigarettes    Start date: 02/01/1973    Quit date: 02/01/1993    Years since quitting: 30.8    Passive exposure: Past   Smokeless tobacco: Never  Vaping Use   Vaping status: Never Used  Substance and Sexual Activity   Alcohol use: Not Currently    Comment: social drinker.   Drug use: No   Sexual activity: Not on file  Other Topics Concern   Not on file  Social History Narrative   Participates in church activities, and food bank 3 days a week   Gym 2 times weekly- treadmill    Social Drivers of Health   Financial Resource Strain: Low Risk  (12/10/2023)   Overall Financial Resource Strain (CARDIA)    Difficulty of Paying Living Expenses: Not hard at all  Food Insecurity: No Food Insecurity (12/10/2023)   Hunger Vital Sign    Worried About Running Out of Food in the Last Year: Never true    Ran Out of Food in the Last Year: Never true  Transportation Needs: No Transportation Needs (12/10/2023)   PRAPARE - Administrator, Civil Service (Medical): No    Lack of Transportation (Non-Medical): No  Physical Activity:  Sufficiently Active (12/10/2023)   Exercise Vital Sign    Days of Exercise per Week: 7 days    Minutes of Exercise per Session: 60 min  Stress: No Stress Concern Present (12/10/2023)   [employer] of Occupational Health - Occupational Stress Questionnaire    Feeling of Stress : Not at all  Social Connections: Moderately Integrated (12/10/2023)   Social Connection and Isolation Panel [NHANES]    Frequency of Communication with Friends and Family: More than three times a week    Frequency of  Social Gatherings with Friends and Family: More than three times a week    Attends Religious Services: More than 4 times per year    Active Member of [employer] or Organizations: No    Attends Engineer, structural: Never    Marital Status: Married    Tobacco Counseling Counseling given: Not Answered   Clinical Intake:  Pre-visit preparation completed: Yes  Pain : No/denies pain     BMI - recorded: 25.13 Nutritional Status: BMI 25 -29 Overweight Nutritional Risks: None Diabetes: No  How often do you need to have someone help you when you read instructions, pamphlets, or other written materials from your doctor or pharmacy?: 1 - Never  Interpreter Needed?: No  Information entered by :: Kennedy Bucker, LPN   Activities of Daily Living    12/10/2023    8:21 AM 07/01/2023    9:18 AM  In your present state of health, do you have any difficulty performing the following activities:  Hearing? 0 0  Vision? 0 0  Difficulty concentrating or making decisions? 0 0  Walking or climbing stairs? 0 0  Dressing or bathing? 0 0  Doing errands, shopping? 0 0  Preparing Food and eating ? N   Using the Toilet? N   In the past six months, have you accidently leaked urine? N   Do you have problems with loss of bowel control? N   Managing your Medications? N   Managing your Finances? N   Housekeeping or managing your Housekeeping? N     Patient Care Team: Smitty Cords, DO as  PCP - General (Family Medicine) Mariah Milling Tollie Pizza, MD as PCP - Cardiology (Cardiology) Antonieta Iba, MD as Consulting Physician (Cardiology) Blair Promise, OD (Optometry)  Indicate any recent Medical Services you may have received from other than [facility] providers in the past year (date may be approximate).     Assessment:   This is a routine wellness examination for [place].  Hearing/Vision screen Hearing Screening - Comments:: NO AIDS Vision Screening - Comments:: [facility]- DR.[email or web address]   Goals Addressed             This Visit's Progress    Cut out extra servings         Depression Screen    12/10/2023    8:18 AM 07/01/2023    9:18 AM 02/03/2023    2:45 PM 12/04/2022    8:11 AM 11/25/2022    3:24 PM 08/26/2022    9:39 AM 11/28/2021    8:27 AM  PHQ 2/9 Scores  PHQ - 2 Score 0 0 0 0 0 0 0  PHQ- 9 Score 0 1 0 0 0 0     Fall Risk    12/10/2023    8:21 AM 07/01/2023    9:18 AM 02/03/2023    2:44 PM 12/04/2022    8:14 AM 11/25/2022    3:24 PM  Fall Risk   Falls in the past year? 0 0 0 1 0  Number falls in past yr: 0 0 0 0 0  Injury with Fall? 0 0  0 0  Risk for fall due to : No Fall Risks No Fall Risks No Fall Risks History of fall(s) No Fall Risks  Follow up Falls prevention discussed;Falls evaluation completed Falls evaluation completed Falls evaluation completed Falls evaluation completed;Falls prevention discussed Falls evaluation completed    MEDICARE RISK AT HOME: Medicare Risk at Home Any stairs in or around the home?: No If so, are  there any without handrails?: No Home free of loose throw rugs in walkways, pet beds, electrical cords, etc?: Yes Adequate lighting in your home to reduce risk of falls?: Yes Life alert?: No Use of a cane, walker or w/c?: No Grab bars in the bathroom?: Yes Shower chair or bench in shower?: Yes Elevated toilet seat or a handicapped toilet?: Yes  TIMED UP AND GO:  Was the test performed?  Yes  Length of time to ambulate  10 feet: 4 sec Gait steady and fast without use of assistive device    Cognitive Function:        12/10/2023    8:23 AM 12/04/2022    8:21 AM 11/05/2020   10:02 AM 02/14/2019    2:33 PM  6CIT Screen  What Year? 0 points 0 points 0 points 0 points  What month? 0 points 0 points 0 points 0 points  What time? 0 points 0 points 0 points 0 points  Count back from 20 2 points 0 points 0 points 0 points  Months in reverse 0 points 0 points 0 points 0 points  Repeat phrase 0 points 0 points 0 points 0 points  Total Score 2 points 0 points 0 points 0 points    Immunizations Immunization History  Administered Date(s) Administered   Fluad Quad(high Dose 65+) 10/04/2019, 09/11/2021   Fluad Trivalent(High Dose 65+) 10/05/2023   Influenza,inj,Quad PF,6+ Mos 10/13/2016, 10/04/2017, 09/20/2018, 09/17/2020, 11/17/2022   Influenza-Unspecified 08/28/2013   Moderna Sars-Covid-2 Vaccination 02/02/2020, 03/01/2020, 06/04/2021   Pneumococcal Conjugate-13 11/13/2014   Pneumococcal Polysaccharide-23 10/16/2013   Tdap 12/22/2007   Zoster Recombinant(Shingrix) 09/27/2023   Zoster, Live 07/21/2014    TDAP status: Due, Education has been provided regarding the importance of this vaccine. Advised may receive this vaccine at local pharmacy or Health Dept. Aware to provide a copy of the vaccination record if obtained from local pharmacy or Health Dept. Verbalized acceptance and understanding.  Flu Vaccine status: Up to date  Pneumococcal vaccine status: Declined,  Education has been provided regarding the importance of this vaccine but patient still declined. Advised may receive this vaccine at local pharmacy or Health Dept. Aware to provide a copy of the vaccination record if obtained from local pharmacy or Health Dept. Verbalized acceptance and understanding.   Covid-19 vaccine status: Completed vaccines  Qualifies for Shingles Vaccine? Yes   Zostavax completed Yes   Shingrix Completed?: No.     Education has been provided regarding the importance of this vaccine. Patient has been advised to call insurance company to determine out of pocket expense if they have not yet received this vaccine. Advised may also receive vaccine at local pharmacy or Health Dept. Verbalized acceptance and understanding.  Screening Tests Health Maintenance  Topic Date Due   DTaP/Tdap/Td (2 - Td or Tdap) 12/21/2017   COVID-19 Vaccine (4 - 2024-25 season) 08/22/2023   Zoster Vaccines- Shingrix (2 of 2) 11/22/2023   Colonoscopy  07/02/2024   Medicare Annual Wellness (AWV)  12/09/2024   Pneumonia Vaccine 31+ Years old  Completed   INFLUENZA VACCINE  Completed   Hepatitis C Screening  Completed   HPV VACCINES  Aged Out    Health Maintenance  Health Maintenance Due  Topic Date Due   DTaP/Tdap/Td (2 - Td or Tdap) 12/21/2017   COVID-19 Vaccine (4 - 2024-25 season) 08/22/2023   Zoster Vaccines- Shingrix (2 of 2) 11/22/2023  HAD 1ST SHINGRIX IN OCTOBER  Colorectal cancer screening: Type of screening: Colonoscopy. Completed 07/02/21. Repeat every  3 years  Lung Cancer Screening: (Low Dose CT Chest recommended if Age 3-80 years, 20 pack-year currently smoking OR have quit w/in 15years.) does not qualify.    Additional Screening:  Hepatitis C Screening: does qualify; Completed 08/30/20  Vision Screening: Recommended annual ophthalmology exams for early detection of glaucoma and other disorders of the eye. Is the patient up to date with their annual eye exam?  Yes  Who is the provider or what is the name of the office in which the patient attends annual eye exams? DR.[email or web address] If pt is not established with a provider, would they like to be referred to a provider to establish care? No .   Dental Screening: Recommended annual dental exams for proper oral hygiene   Community Resource Referral / Chronic Care Management: CRR required this visit?  No   CCM required this visit?  No     Plan:     I  have personally reviewed and noted the following in the patient's chart:   Medical and social history Use of alcohol, tobacco or illicit drugs  Current medications and supplements including opioid prescriptions. Patient is not currently taking opioid prescriptions. Functional ability and status Nutritional status Physical activity Advanced directives List of other physicians Hospitalizations, surgeries, and ER visits in previous 12 months Vitals Screenings to include cognitive, depression, and falls Referrals and appointments  In addition, I have reviewed and discussed with patient certain preventive protocols, quality metrics, and best practice recommendations. A written personalized care plan for preventive services as well as general preventive health recommendations were provided to patient.     Hal Hope, LPN   16/09/9603   After Visit Summary: (In Person-Declined) Patient declined AVS at this time.- MY CHART  Nurse Notes: NONE

## 2023-12-28 ENCOUNTER — Telehealth: Payer: Self-pay | Admitting: Cardiovascular Disease

## 2023-12-28 NOTE — Telephone Encounter (Signed)
 Pt c/o medication issue:  1. Name of Medication:   apixaban  (ELIQUIS ) 5 MG TABS tablet    2. How are you currently taking this medication (dosage and times per day)?   Take 1 tablet (5 mg total) by mouth 2 (two) times daily.    3. Are you having a reaction (difficulty breathing--STAT)? No  4. What is your medication issue? Pt would like a c/b as to whether another medication is able to be prescribed being that above medication is now $800.00. Please advise

## 2023-12-28 NOTE — Telephone Encounter (Signed)
 Called and spoke with patient. Patient states that his eliquis has increased to 800$ and that he is not able to afford the medication. Patient states that he is interested in patient assistance.

## 2024-01-07 NOTE — Telephone Encounter (Signed)
Spoke with patient and reviewed information on eliquis assistance. Instructed him to call insurance company to find out what his deductible is and if he could do the payment plan for the year. Reviewed that patient assistance application requires that you meet 3% of your income in order to qualify for that program. He verbalized understanding of our conversation with no further questions at this time. Encouraged him to call back if any further needs.

## 2024-02-08 ENCOUNTER — Encounter: Payer: Self-pay | Admitting: Urology

## 2024-02-08 ENCOUNTER — Ambulatory Visit: Payer: Medicare Other | Admitting: Urology

## 2024-02-08 VITALS — BP 125/82 | Ht 71.0 in | Wt 180.0 lb

## 2024-02-08 DIAGNOSIS — N401 Enlarged prostate with lower urinary tract symptoms: Secondary | ICD-10-CM | POA: Diagnosis not present

## 2024-02-08 DIAGNOSIS — N138 Other obstructive and reflux uropathy: Secondary | ICD-10-CM

## 2024-02-08 DIAGNOSIS — R972 Elevated prostate specific antigen [PSA]: Secondary | ICD-10-CM | POA: Diagnosis not present

## 2024-02-08 DIAGNOSIS — R339 Retention of urine, unspecified: Secondary | ICD-10-CM | POA: Diagnosis not present

## 2024-02-08 DIAGNOSIS — N529 Male erectile dysfunction, unspecified: Secondary | ICD-10-CM

## 2024-02-08 MED ORDER — TADALAFIL 10 MG PO TABS: ORAL_TABLET | ORAL | 3 refills | Status: DC

## 2024-02-08 MED ORDER — TAMSULOSIN HCL 0.4 MG PO CAPS
0.4000 mg | ORAL_CAPSULE | Freq: Every day | ORAL | 3 refills | Status: DC
Start: 2024-02-08 — End: 2024-07-25

## 2024-02-08 NOTE — Progress Notes (Signed)
   02/08/2024 1:38 PM   Nunzio Cory 1946/04/17 [id number]  Reason for visit: Follow up BPH, ED, elevated PSA, left hydrocele  HPI: 78 year old male who I have followed for the above issues since February 2024.  He was previously followed by Dr. Apolinar Junes but requested to change to a male provider.  He previously had a mildly elevated PSA ranging from 3-6, and a prostate MRI in 2020 showed a 100 g prostate with no suspicious lesions and a large median lobe.  Recent PSA is stable at 4.57 from July 2024, no further PSA screening needed per the guideline recommendations.  Reassurance provided regarding very low PSA density and history of negative prostate MRI.  He remains on Flomax for his BPH symptoms which are very well-controlled.  PVR today is normal at 12ml.  He has some occasional urgency with soda intake, counseled to avoid bladder irritants.  Using Cialis 10 to 20 mg for ED with good results, refilled.  He had questions about concerns about penile shrinkage today.  On exam normal-appearing phallus.  Will check testosterone level, we had a conversation about realistic expectations regarding penile size, erections, and aging.  He also reports a left-sided scrotal swelling and was previously diagnosed with a hydrocele.  This is increased only slightly over the last few years and he is not bothered enough to consider intervention at this time.  Cialis and Flomax refilled Check morning testosterone, call with results  RTC 1 year PVR    Sondra Come, MD  [facility] [address], [address] [place], [place] [address] [phone]

## 2024-02-08 NOTE — Patient Instructions (Signed)
Come by the lab one morning to have your testosterone level checked.

## 2024-02-11 ENCOUNTER — Other Ambulatory Visit
Admission: RE | Admit: 2024-02-11 | Discharge: 2024-02-11 | Disposition: A | Discharge status: Home / Self Care | Payer: Medicare Other | Attending: Urology | Admitting: Urology

## 2024-02-11 DIAGNOSIS — R339 Retention of urine, unspecified: Secondary | ICD-10-CM | POA: Diagnosis present

## 2024-02-11 DIAGNOSIS — N138 Other obstructive and reflux uropathy: Secondary | ICD-10-CM | POA: Insufficient documentation

## 2024-02-11 DIAGNOSIS — N401 Enlarged prostate with lower urinary tract symptoms: Secondary | ICD-10-CM | POA: Diagnosis present

## 2024-02-12 LAB — TESTOSTERONE: Testosterone: 373 ng/dL (ref 264–916)

## 2024-02-14 ENCOUNTER — Encounter: Payer: Self-pay | Admitting: Family Medicine

## 2024-02-14 ENCOUNTER — Ambulatory Visit (INDEPENDENT_AMBULATORY_CARE_PROVIDER_SITE_OTHER): Payer: Medicare Other | Admitting: Family Medicine

## 2024-02-14 VITALS — BP 110/78 | HR 72 | Ht 71.0 in | Wt 181.0 lb

## 2024-02-14 DIAGNOSIS — M25512 Pain in left shoulder: Secondary | ICD-10-CM | POA: Diagnosis not present

## 2024-02-14 DIAGNOSIS — M67912 Unspecified disorder of synovium and tendon, left shoulder: Secondary | ICD-10-CM | POA: Diagnosis not present

## 2024-02-14 DIAGNOSIS — M7552 Bursitis of left shoulder: Secondary | ICD-10-CM | POA: Diagnosis not present

## 2024-02-14 DIAGNOSIS — G8929 Other chronic pain: Secondary | ICD-10-CM

## 2024-02-14 NOTE — Progress Notes (Signed)
 Subjective:    Patient ID: Andrew Benson, male    DOB: [birth year], 78 y.o.   MRN: [id number]  Andrew Benson is a 78 y.o. male presenting on 02/14/2024 for Shoulder Pain   HPI  Discussed the use of AI scribe software for clinical note transcription with the patient, who gave verbal consent to proceed.  History of Present Illness   Andrew Benson "Andrew Benson" is a 78 year old male who presents with worsening chronic shoulder pain.  He has been experiencing chronic shoulder pain for approximately five years. A previous injection on February 15, 2023, provided relief for about a year, but the effects have since diminished. He now finds it difficult to hold his arm up, especially during long car rides, necessitating frequent lowering of his arm.  Recently, he experienced a temporary episode of pain radiating from his shoulder to his neck and the back of his head, described as more than a 'twinge and a pinch.' This episode has resolved, leaving only a very slight sensation in his neck.  The pain impacts his ability to perform activities such as driving, where he cannot hold the steering wheel for long periods and must hold it from the bottom. He also lifts heavy boxes weekly, which may contribute to his symptoms.  He has tried topical treatments like Voltaren, which he finds ineffective, and is limited in oral medication options due to his use of Eliquis. He has not had any fractures, and previous x-rays have not shown structural bone issues.  Last X-ray 01/2023 without fracture. No prior MRI or PT course.  He has not seen Orthopedics. Interested in other opinion before continuing shots.          12/10/2023    8:18 AM 07/01/2023    9:18 AM 02/03/2023    2:45 PM  Depression screen PHQ 2/9  Decreased Interest 0 0 0  Down, Depressed, Hopeless 0 0 0  PHQ - 2 Score 0 0 0  Altered sleeping 0 1 0  Tired, decreased energy 0 0 0  Change in appetite 0 0 0  Feeling bad or failure about yourself  0 0  0  Trouble concentrating 0 0 0  Moving slowly or fidgety/restless 0 0 0  Suicidal thoughts 0 0 0  PHQ-9 Score 0 1 0  Difficult doing work/chores Not difficult at all Not difficult at all Not difficult at all       07/01/2023    9:18 AM 02/03/2023    2:45 PM 11/25/2022    3:25 PM 08/26/2022    9:40 AM  GAD 7 : Generalized Anxiety Score  Nervous, Anxious, on Edge 0 0 0 0  Control/stop worrying 0 0 0 0  Worry too much - different things 0 0 0 0  Trouble relaxing 0 0 0 0  Restless 0 0 0 0  Easily annoyed or irritable 0 0 0 0  Afraid - awful might happen 0 0 0 0  Total GAD 7 Score 0 0 0 0  Anxiety Difficulty Not difficult at all Not difficult at all Not difficult at all Not difficult at all    Social History   Tobacco Use   Smoking status: Former    Current packs/day: 0.00    Average packs/day: 1 pack/day for 20.0 years (20.0 ttl pk-yrs)    Types: Cigarettes    Start date: 02/01/1973    Quit date: 02/01/1993    Years since quitting: 31.0    Passive exposure: Past  Smokeless tobacco: Never  Vaping Use   Vaping status: Never Used  Substance Use Topics   Alcohol use: Not Currently    Comment: social drinker.   Drug use: No    Review of Systems Per HPI unless specifically indicated above     Objective:    BP 110/78   Pulse 72   Ht 5\' 11"  (1.803 m)   Wt 181 lb (82.1 kg)   BMI 25.24 kg/m   Wt Readings from Last 3 Encounters:  02/14/24 181 lb (82.1 kg)  02/08/24 180 lb (81.6 kg)  12/10/23 180 lb 3.2 oz (81.7 kg)    Physical Exam Vitals and nursing note reviewed.  Constitutional:      General: He is not in acute distress.    Appearance: Normal appearance. He is well-developed. He is not diaphoretic.     Comments: Well-appearing, comfortable, cooperative  HENT:     Head: Normocephalic and atraumatic.  Eyes:     General:        Right eye: No discharge.        Left eye: No discharge.     Conjunctiva/sclera: Conjunctivae normal.  Cardiovascular:     Rate and  Rhythm: Normal rate.  Pulmonary:     Effort: Pulmonary effort is normal.  Musculoskeletal:     Comments: Left Shoulder Inspection: Normal appearance bilateral symmetrical Palpation: Non-tender to palpation over anterior, lateral, or posterior shoulder  ROM: Improved range of motion forward flex and abduct above head L shoulder. Still mild reduced Special Testing:  provoked symptoms on rotator cuff impingement testing. Strength: Normal strength 5/5 flex/ext, ext rot / int rot, grip, rotator cuff str testing. Neurovascular: Distally intact pulses, sensation to light touch   Skin:    General: Skin is warm and dry.     Findings: No erythema or rash.  Neurological:     Mental Status: He is alert and oriented to person, place, and time.  Psychiatric:        Mood and Affect: Mood normal.        Behavior: Behavior normal.        Thought Content: Thought content normal.     Comments: Well groomed, good eye contact, normal speech and thoughts     I have personally reviewed the radiology report from 02/03/23 on L Shoulder.  CLINICAL DATA:  Chronic left shoulder pain   EXAM: LEFT SHOULDER - 3 VIEW   COMPARISON:  None Available.   FINDINGS: There is no evidence of fracture or dislocation. There is no evidence of arthropathy or other focal bone abnormality. Soft tissues are unremarkable.   IMPRESSION: No acute osseous abnormality     Electronically Signed   By: Karen Kays M.D.   On: 02/06/2023 12:19   Results for orders placed or performed during the hospital encounter of 02/11/24  Testosterone   Collection Time: 02/11/24  9:45 AM  Result Value Ref Range   Testosterone 373 264 - 916 ng/dL      Assessment & Plan:   Problem List Items Addressed This Visit   None Visit Diagnoses       Chronic left shoulder pain    -  Primary   Relevant Orders   Ambulatory referral to Orthopedic Surgery     Chronic bursitis of left shoulder       Relevant Orders   Ambulatory referral  to Orthopedic Surgery     Tendinopathy of left rotator cuff       Relevant Orders  Ambulatory referral to Orthopedic Surgery         Chronic Left Shoulder Pain Chronic shoulder pain for approximately 5 years, previously managed with corticosteroid injection (last injection 02/13/2023) with good response for about a year. Recent transient neck and head pain, now resolved. Patient also reports heavy lifting activities which may exacerbate the condition. Discussed the temporary nature of corticosteroid injections and the potential need for surgical intervention if the condition continues to worsen.  Discussed likely underlying soft tissue muscular etiology with rotator cuff / bursitis given negative X-ray L Shoulder 01/2023 X-ray reviewed above. He has not had MRI or other imaging.  -Consider another corticosteroid injection if pain continues to worsen. 1 year is a great response, would be able to continue, but he is inquiring about permanent or long term results. We discussed limitation of cortisone injections  -Refer to orthopedic specialist for further evaluation and potential surgical intervention. Consider MRI for detailed diagnosis if referred to orthopedic specialist. Ultimately he may warrant procedure such as arthroscopy if indicated. He can pursue PT and other therapy from them, may repeat steroid injection as well  -Advise patient on potential need for lifestyle modifications to reduce strain on the shoulder.       Orders Placed This Encounter  Procedures   Ambulatory referral to Orthopedic Surgery    Referral Priority:   Routine    Referral Type:   Surgical    Referral Reason:   Specialty Services Required    Requested Specialty:   Orthopedic Surgery    Number of Visits Requested:   1    No orders of the defined types were placed in this encounter.   Follow up plan: Return if symptoms worsen or fail to improve.   Saralyn Pilar, DO [facility] 02/14/2024, 8:38 AM

## 2024-02-14 NOTE — Patient Instructions (Addendum)
 Thank you for coming to the office today.  Referral to Orthopedics  [facility] & [facility] MEDICINE [facility] [address] [place], [place]  [address] Phone: [phone]  Please schedule a Follow-up Appointment to: Return if symptoms worsen or fail to improve.  If you have any other questions or concerns, please feel free to call the office or send a message through MyChart. You may also schedule an earlier appointment if necessary.  Additionally, you may be receiving a survey about your experience at our office within a few days to 1 week by e-mail or mail. We value your feedback.  Saralyn Pilar, DO [facility] [place] [facility], [place]

## 2024-03-20 ENCOUNTER — Ambulatory Visit (INDEPENDENT_AMBULATORY_CARE_PROVIDER_SITE_OTHER): Admitting: Internal Medicine

## 2024-03-20 ENCOUNTER — Encounter: Payer: Self-pay | Admitting: Internal Medicine

## 2024-03-20 VITALS — BP 124/74 | Ht 71.0 in | Wt 178.6 lb

## 2024-03-20 DIAGNOSIS — R2232 Localized swelling, mass and lump, left upper limb: Secondary | ICD-10-CM | POA: Diagnosis not present

## 2024-03-20 DIAGNOSIS — L03114 Cellulitis of left upper limb: Secondary | ICD-10-CM | POA: Diagnosis not present

## 2024-03-20 DIAGNOSIS — T63481A Toxic effect of venom of other arthropod, accidental (unintentional), initial encounter: Secondary | ICD-10-CM

## 2024-03-20 MED ORDER — PREDNISONE 10 MG PO TABS
ORAL_TABLET | ORAL | 0 refills | Status: DC
Start: 2024-03-20 — End: 2024-03-20

## 2024-03-20 MED ORDER — METHYLPREDNISOLONE ACETATE 80 MG/ML IJ SUSP
80.0000 mg | Freq: Once | INTRAMUSCULAR | Status: AC
Start: 2024-03-20 — End: 2024-03-20
  Administered 2024-03-20: 80 mg via INTRAMUSCULAR

## 2024-03-20 MED ORDER — CEPHALEXIN 500 MG PO CAPS: 500.0000 mg | ORAL_CAPSULE | Freq: Three times a day (TID) | ORAL | 0 refills | Status: DC

## 2024-03-20 NOTE — Progress Notes (Signed)
 Subjective:    Patient ID: Andrew Benson, male    DOB: 1946/02/08, 78 y.o.   MRN: [id number]  HPI  Discussed the use of AI scribe software for clinical note transcription with the patient, who gave verbal consent to proceed.  History of Present Illness   Andrew Benson "[name]" is a 78 year old male who presents with a swollen and stiff forearm following a insect sting.  He was bitten on the hand while working in a pool yesterday afternoon. The water was described as 'nasty', and the bite drew blood. This morning, he noticed swelling and stiffness in the wrist, making it difficult to wear his watch. No numbness or tingling in the fingers. He applied Cortaid (cortisone) topically as the only treatment so far.  He has a known allergy to penicillin, which reportedly caused a rash when he was hospitalized as a baby.        Review of Systems   Past Medical History:  Diagnosis Date   Asthma    CKD (chronic kidney disease), stage II    Diastolic dysfunction    a. 12/2022 Echo: EF 55-60%, no rwma, GrI DD, mild LVH, nl RV fxn, Ao sclerosis. Ao root 40mm.   Dilated aortic root (HCC)    a. 12/2022 Echo: Ao root 40mm.   Essential hypertension    History of cardiovascular stress test    a. 12/2022 Ex MV: EF 34%, freq PVCs, no ST/T changes, HTN response (211/98), no isch/infarct. CT attenuation images w/o significant Ao or Cor atherosclerosis.   Hyperlipidemia    NSVT (nonsustained ventricular tachycardia) (HCC)    PAF (paroxysmal atrial fibrillation) (HCC)    a. CHA2DS2VASc = 3;  b. takes amio/eliquis/metoprolol prn for PAF.   Palpitations    a. 11/2022 Zio: Predominantly sinus rhythm at 65 (40-255).  4 runs of NSVT, longest 8 beats (162), fastest 255 (6 beats). 8 SVT runs, longests 15 beats (127), fastest 143 5 beats). Rare PACs/PVCs.   PSVT (paroxysmal supraventricular tachycardia) (HCC)    TMJ locking     Current Outpatient Medications  Medication Sig Dispense Refill   amiodarone  (PACERONE) 200 MG tablet TAKE 1 TABLET (200 MG TOTAL) BY MOUTH DAILY AS NEEDED (TAKE FOR A FIB). 90 tablet 3   apixaban (ELIQUIS) 5 MG TABS tablet Take 1 tablet (5 mg total) by mouth 2 (two) times daily. 180 tablet 3   fluticasone (FLONASE) 50 MCG/ACT nasal spray PLACE 2 SPRAYS INTO BOTH NOSTRILS DAILY. USE FOR 4-6 WEEKS THEN STOP AND USE SEASONALLY OR AS NEEDED 48 mL 0   lisinopril (ZESTRIL) 2.5 MG tablet TAKE 1 TABLET BY MOUTH daily as needed for pressure >160 (Patient not taking: Reported on 02/14/2024) 30 tablet 3   metoprolol tartrate (LOPRESSOR) 25 MG tablet Take 0.5 tablets (12.5 mg total) by mouth 2 (two) times daily.     Multiple Vitamin (MULTIVITAMIN) capsule Take 1 capsule by mouth daily.     rosuvastatin (CRESTOR) 20 MG tablet Take 1 tablet (20 mg total) by mouth daily. 90 tablet 3   tadalafil (CIALIS) 10 MG tablet Take 10mg  daily, with an additional 10mg  dose as needed 1 hour prior to sexual activity 90 tablet 3   tamsulosin (FLOMAX) 0.4 MG CAPS capsule Take 1 capsule (0.4 mg total) by mouth daily. 90 capsule 3   No current facility-administered medications for this visit.    Allergies  Allergen Reactions   Penicillins     Rash Rash     Family History  Problem Relation Age of Onset   Hyperlipidemia Mother    Hypertension Mother    Heart disease Mother    Heart disease Sister        valve repair    Diabetes Brother    Hypertension Brother     Social History   Socioeconomic History   Marital status: Married    Spouse name: Not on file   Number of children: Not on file   Years of education: Not on file   Highest education level: Associate degree: academic program  Occupational History   Not on file  Tobacco Use   Smoking status: Former    Current packs/day: 0.00    Average packs/day: 1 pack/day for 20.0 years (20.0 ttl pk-yrs)    Types: Cigarettes    Start date: 02/01/1973    Quit date: 02/01/1993    Years since quitting: 31.1    Passive exposure: Past    Smokeless tobacco: Never  Vaping Use   Vaping status: Never Used  Substance and Sexual Activity   Alcohol use: Not Currently    Comment: social drinker.   Drug use: No   Sexual activity: Not on file  Other Topics Concern   Not on file  Social History Narrative   Participates in church activities, and food bank 3 days a week   Gym 2 times weekly- treadmill    Social Drivers of Health   Financial Resource Strain: Low Risk  (12/10/2023)   Overall Financial Resource Strain (CARDIA)    Difficulty of Paying Living Expenses: Not hard at all  Food Insecurity: No Food Insecurity (12/10/2023)   Hunger Vital Sign    Worried About Running Out of Food in the Last Year: Never true    Ran Out of Food in the Last Year: Never true  Transportation Needs: No Transportation Needs (12/10/2023)   PRAPARE - Administrator, Civil Service (Medical): No    Lack of Transportation (Non-Medical): No  Physical Activity: Sufficiently Active (12/10/2023)   Exercise Vital Sign    Days of Exercise per Week: 7 days    Minutes of Exercise per Session: 60 min  Stress: No Stress Concern Present (12/10/2023)   [employer] of Occupational Health - Occupational Stress Questionnaire    Feeling of Stress : Not at all  Social Connections: Moderately Integrated (12/10/2023)   Social Connection and Isolation Panel [NHANES]    Frequency of Communication with Friends and Family: More than three times a week    Frequency of Social Gatherings with Friends and Family: More than three times a week    Attends Religious Services: More than 4 times per year    Active Member of [employer] or Organizations: No    Attends Banker Meetings: Never    Marital Status: Married  Catering manager Violence: Not At Risk (12/10/2023)   Humiliation, Afraid, Rape, and Kick questionnaire    Fear of Current or Ex-Partner: No    Emotionally Abused: No    Physically Abused: No    Sexually Abused: No      Constitutional: Denies fever, malaise, fatigue, headache or abrupt weight changes.  HEENT: Denies eye pain, eye redness, ear pain, ringing in the ears, wax buildup, runny nose, nasal congestion, bloody nose, or sore throat. Respiratory: Denies difficulty breathing, shortness of breath, cough or sputum production.   Cardiovascular: Denies chest pain, chest tightness, palpitations or swelling in the hands or feet.  Musculoskeletal: Patient reports localized swelling of the left forearm.  Denies decrease in range of motion, difficulty with gait, muscle pain or joint pain and swelling.  Skin: Patient reports redness of the left forearm.  Denies rashes, lesions or ulcercations.  Neurological: Denies numbness, tingling, weakness, difficulty with speech or problems with balance and coordination.    No other specific complaints in a complete review of systems (except as listed in HPI above).      Objective:   Physical Exam  BP 124/74 (BP Location: Right Arm, Patient Position: Sitting, Cuff Size: Normal)   Ht 5\' 11"  (1.803 m)   Wt 178 lb 9.6 oz (81 kg)   BMI 24.91 kg/m   Wt Readings from Last 3 Encounters:  02/14/24 181 lb (82.1 kg)  02/08/24 180 lb (81.6 kg)  12/10/23 180 lb 3.2 oz (81.7 kg)    General: Appears his stated age, well developed, well nourished in NAD. Skin: Redness and warmth starting at the medial aspect of the left wrist extending up to the mid forearm. HEENT: Head: normal shape and size; Eyes: sclera white, no icterus, conjunctiva pink, PERRLA and EOMs intact; Throat/Mouth: No swelling of the lips or tongue noted. Cardiovascular: Normal rate and rhythm.  Radial pulse 2+ on the left. Pulmonary/Chest: Normal effort and positive vesicular breath sounds. No respiratory distress. No wheezes, rales or ronchi noted.  Musculoskeletal: Normal flexion, extension and rotation of the left wrist.  Localized swelling of the mid medial left forearm surrounding and 16.  No  difficulty with gait.  Neurological: Alert and oriented.  Sensation intact to the left hand.  BMET    Component Value Date/Time   NA 144 06/22/2023 0816   NA 148 (H) 03/23/2022 1000   NA 140 10/26/2013 1132   K 3.9 06/22/2023 0816   K 4.1 10/26/2013 1132   CL 110 06/22/2023 0816   CL 111 (H) 10/26/2013 1132   CO2 28 06/22/2023 0816   CO2 28 10/26/2013 1132   GLUCOSE 101 (H) 06/22/2023 0816   GLUCOSE 100 (H) 10/26/2013 1132   BUN 17 06/22/2023 0816   BUN 16 03/23/2022 1000   BUN 15 10/26/2013 1132   CREATININE 1.19 06/22/2023 0816   CALCIUM 9.1 06/22/2023 0816   CALCIUM 8.6 10/26/2013 1132   GFRNONAA >60 01/05/2023 0031   GFRNONAA 53 (L) 08/30/2020 0837   GFRAA 61 08/30/2020 0837    Lipid Panel     Component Value Date/Time   CHOL 138 06/22/2023 0816   CHOL 170 03/23/2022 1000   TRIG 58 06/22/2023 0816   HDL 53 06/22/2023 0816   HDL 59 03/23/2022 1000   CHOLHDL 2.6 06/22/2023 0816   VLDL 18 10/13/2016 0827   LDLCALC 72 06/22/2023 0816    CBC    Component Value Date/Time   WBC 4.0 06/22/2023 0816   RBC 4.30 06/22/2023 0816   HGB 13.5 06/22/2023 0816   HGB 14.8 05/01/2016 0846   HCT 39.2 06/22/2023 0816   HCT 41.2 05/01/2016 0846   PLT 109 (L) 06/22/2023 0816   PLT 129 (L) 05/01/2016 0846   MCV 91.2 06/22/2023 0816   MCV 86 05/01/2016 0846   MCV 87 10/26/2013 1132   MCH 31.4 06/22/2023 0816   MCHC 34.4 06/22/2023 0816   RDW 14.2 06/22/2023 0816   RDW 14.8 05/01/2016 0846   RDW 14.7 (H) 10/26/2013 1132   LYMPHSABS 1,356 06/22/2023 0816   LYMPHSABS 1.5 05/01/2016 0846   LYMPHSABS 2.7 10/26/2013 1132   MONOABS 0.4 01/05/2023 0031   MONOABS 0.5 10/26/2013 1132  EOSABS 92 06/22/2023 0816   EOSABS 0.2 05/01/2016 0846   EOSABS 0.2 10/26/2013 1132   BASOSABS 40 06/22/2023 0816   BASOSABS 0.0 05/01/2016 0846   BASOSABS 0.1 10/26/2013 1132    Hgb A1C Lab Results  Component Value Date   HGBA1C 5.6 06/22/2023            Assessment & Plan:    Assessment and Plan    Allergic Reaction to Insect Bite He experienced an allergic reaction to an insect bite on the left hand, resulting in swelling and redness extending to the wrist. There is no numbness or tingling in the fingers. The primary concern is potential arterial compression, which could lead to reduced blood flow and increased pain. He has not taken oral antihistamines but has applied topical cortisone. Informed consent was obtained for the treatment plan, which includes a steroid injection, and antibiotic therapy. The risks of arterial compression and the need for emergency care if swelling encircles the wrist were discussed. The anticipated outcome is resolution of swelling and prevention of infection. - Administer 80 mg Depo Medrol IM injection. -He declined Rx for Pred taper for 6 days - Prescribe Keflex 500 mg orally three times a day for 7 days. - Instruct him to monitor for increased swelling, especially if it encircles the wrist, and to seek emergency care if this occurs. - Advise him to stop Keflex and contact the office if a rash, or swelling of lips, tongue, or throat develops. - Recommend keeping Benadryl 25 mg every 8 hours.    Follow-up with your PCP as previously scheduled Nicki Reaper, NP

## 2024-03-20 NOTE — Patient Instructions (Signed)
Insect Bite, Adult An insect bite can make your skin red, itchy, and swollen. Some insects can spread disease to people with a bite. However, most insect bites do not lead to disease, and most are not serious. What are the causes? Insects may bite for many reasons, including: Hunger. To defend themselves. Insects that bite include: Spiders. Mosquitoes. Flies. Ticks and fleas. Ants. Kissing bugs. Chiggers. What are the signs or symptoms? Symptoms often last for 2-4 days. However, itching can last up to 10 days. Symptoms include: Itching or pain in the bite area. Redness and swelling in the bite area. An open wound. In rare cases, a person may have a very bad allergic reaction (anaphylactic reaction) to a bite. Symptoms of an anaphylactic reaction may include: Feeling warm in the face (flushed). Your face may turn red. Itchy, red, swollen areas of skin (hives). Swelling of the eyes, lips, face, mouth, tongue, or throat. Trouble with breathing, talking, or swallowing. High-pitched whistling sounds, most often when breathing out (wheezing). Feeling dizzy or light-headed. Fainting. Pain or cramps in your belly (abdomen). Vomiting. Watery poop (diarrhea). How is this treated? Most insect bites are not serious. Symptoms often go away on their own. When treatment is advised, it may include: Putting ice on the bite area. Putting a cream or lotion, like calamine lotion, on the bite area. This helps with itching. Using medicines called antihistamines. You may also need: A tetanus shot if you are not up to date. An antibiotic cream or medicine. This treatment is needed if the bite area gets infected. Follow these instructions at home: Bite area care  Do not scratch the bite area. It may help to cover the bite area with a bandage or close-fitting clothing. Keep the bite area clean and dry. Check the bite area every day for signs of infection. Check for: More redness, swelling, or  pain. Fluid or blood. Warmth. Pus or a bad smell. Wash your hands often. Managing pain, itching, and swelling  You may put any of these on the bite area as told by your doctor: A paste made of baking soda and water. Cortisone cream. Calamine lotion. If told, put ice on the bite area. To do this: Put ice in a plastic bag. Place a towel between your skin and the bag. Leave the ice on for 20 minutes, 2-3 times a day. If your skin turns bright red, take off the ice right away to prevent skin damage. The risk of skin damage is higher if you cannot feel pain, heat, or cold. General instructions Apply or take over-the-counter and prescription medicines only as told by your doctor. If you were prescribed antibiotics, take or apply them as told by your doctor. Do not stop using them even if you start to feel better. How is this prevented? To help you have a lower risk of insect bites: When you are outside, wear clothes that cover your arms and legs. Use insect repellent. The best insect repellents contain one of these: DEET. Picaridin. Oil of lemon eucalyptus (OLE). IR3535. Consider spraying your clothing with a pesticide called permethrin. Permethrin helps prevent insect bites. It works for several weeks and for up to 5-6 clothing washes. Do not apply permethrin directly to the skin. If your home windows do not have screens, think about putting some in. If you will be sleeping in an area where there are mosquitoes, consider covering your sleeping area with a mosquito net. Contact a doctor if: You have redness, swelling, or pain   in the bite area. You have fluid or blood coming from the bite area. The bite area feels warm to the touch. You have pus or a bad smell coming from the bite area. You have a fever. Get help right away if: You have joint pain. You have a rash. You feel weak or more tired than you normally do. You have neck pain or a headache. You have signs of an anaphylactic  reaction. Signs may include: Swelling of your eyes, lips, face, mouth, tongue, or throat. Feeling warm in the face. Itchy, red, swollen areas of skin. Trouble with breathing, talking, or swallowing. Wheezing. Feeling dizzy or light-headed. Fainting. Pain or cramps in your belly. Vomiting or watery poop. These symptoms may be an emergency. Get help right away. Call 911. Do not wait to see if symptoms will go away. Do not drive yourself to the hospital. Summary An insect bite can make your skin red, itchy, and swollen. Treatment is usually not needed. Symptoms often go away on their own. Do not scratch the bite area. Keep it clean and dry. Use insect repellent to help prevent insect bites. Contact a doctor if you have signs of infection. This information is not intended to replace advice given to you by your health care provider. Make sure you discuss any questions you have with your health care provider. Document Revised: 03/03/2022 Document Reviewed: 03/03/2022 Elsevier Patient Education  2024 Elsevier Inc.  

## 2024-05-11 ENCOUNTER — Other Ambulatory Visit: Payer: Self-pay | Admitting: Student

## 2024-05-11 DIAGNOSIS — G8929 Other chronic pain: Secondary | ICD-10-CM

## 2024-05-11 DIAGNOSIS — M67912 Unspecified disorder of synovium and tendon, left shoulder: Secondary | ICD-10-CM

## 2024-05-11 DIAGNOSIS — M19012 Primary osteoarthritis, left shoulder: Secondary | ICD-10-CM

## 2024-05-14 ENCOUNTER — Ambulatory Visit
Admission: RE | Admit: 2024-05-14 | Discharge: 2024-05-14 | Disposition: A | Discharge status: Home / Self Care | Source: Ambulatory Visit | Attending: Student | Admitting: Student

## 2024-05-14 DIAGNOSIS — M67912 Unspecified disorder of synovium and tendon, left shoulder: Secondary | ICD-10-CM | POA: Diagnosis present

## 2024-05-14 DIAGNOSIS — M25512 Pain in left shoulder: Secondary | ICD-10-CM | POA: Insufficient documentation

## 2024-05-14 DIAGNOSIS — M19012 Primary osteoarthritis, left shoulder: Secondary | ICD-10-CM | POA: Insufficient documentation

## 2024-05-14 DIAGNOSIS — G8929 Other chronic pain: Secondary | ICD-10-CM | POA: Diagnosis present

## 2024-05-30 ENCOUNTER — Encounter: Payer: Self-pay | Admitting: Internal Medicine

## 2024-05-30 ENCOUNTER — Ambulatory Visit: Admitting: Internal Medicine

## 2024-05-30 VITALS — BP 118/72 | Ht 71.0 in | Wt 179.6 lb

## 2024-05-30 DIAGNOSIS — H00011 Hordeolum externum right upper eyelid: Secondary | ICD-10-CM

## 2024-05-30 MED ORDER — ERYTHROMYCIN 5 MG/GM OP OINT: 1.0000 | TOPICAL_OINTMENT | Freq: Every day | OPHTHALMIC | 0 refills | Status: DC

## 2024-05-30 NOTE — Progress Notes (Signed)
 Subjective:    Patient ID: Andrew Benson, male    DOB: [birth year], 78 y.o.   MRN: [id number]  HPI  Discussed the use of AI scribe software for clinical note transcription with the patient, who gave verbal consent to proceed.  Andrew Walter "Lesia Raring" is a 78 year old male who presents with a stye on his right eye.  He noticed the stye on his right eye approximately two days ago. Initially, the symptoms were more severe than he is currently. The eye is swollen and puffy, with some blurriness in vision, particularly in the right eye compared to the left. He experiences constant redness in his eyes, which is not different from usual, but there is significant tearing in the affected eye. He has not applied any topical treatments to the eye but did try a warm compress the previous night, which provided some relief. He has not used any medications specifically for the eye issue prior to this visit.  He also reports nasal congestion and a runny nose with clear discharge, which he initially attributed to a cold. He has been using cold medicine and cough drops, which have provided some relief. No ear pain, sore throat, or cough.       Review of Systems   Past Medical History:  Diagnosis Date   Asthma    CKD (chronic kidney disease), stage II    Diastolic dysfunction    a. 12/2022 Echo: EF 55-60%, no rwma, GrI DD, mild LVH, nl RV fxn, Ao sclerosis. Ao root 40mm.   Dilated aortic root (HCC)    a. 12/2022 Echo: Ao root 40mm.   Essential hypertension    History of cardiovascular stress test    a. 12/2022 Ex MV: EF 34%, freq PVCs, no ST/T changes, HTN response (211/98), no isch/infarct. CT attenuation images w/o significant Ao or Cor atherosclerosis.   Hyperlipidemia    NSVT (nonsustained ventricular tachycardia) (HCC)    PAF (paroxysmal atrial fibrillation) (HCC)    a. CHA2DS2VASc = 3;  b. takes amio/eliquis /metoprolol  prn for PAF.   Palpitations    a. 11/2022 Zio: Predominantly sinus rhythm at 65  (40-255).  4 runs of NSVT, longest 8 beats (162), fastest 255 (6 beats). 8 SVT runs, longests 15 beats (127), fastest 143 5 beats). Rare PACs/PVCs.   PSVT (paroxysmal supraventricular tachycardia) (HCC)    TMJ locking     Current Outpatient Medications  Medication Sig Dispense Refill   amiodarone  (PACERONE ) 200 MG tablet TAKE 1 TABLET (200 MG TOTAL) BY MOUTH DAILY AS NEEDED (TAKE FOR A FIB). 90 tablet 3   apixaban  (ELIQUIS ) 5 MG TABS tablet Take 1 tablet (5 mg total) by mouth 2 (two) times daily. 180 tablet 3   cephALEXin  (KEFLEX ) 500 MG capsule Take 1 capsule (500 mg total) by mouth 3 (three) times daily. 21 capsule 0   fluticasone  (FLONASE ) 50 MCG/ACT nasal spray PLACE 2 SPRAYS INTO BOTH NOSTRILS DAILY. USE FOR 4-6 WEEKS THEN STOP AND USE SEASONALLY OR AS NEEDED 48 mL 0   lisinopril  (ZESTRIL ) 2.5 MG tablet TAKE 1 TABLET BY MOUTH daily as needed for pressure >160 (Patient not taking: Reported on 03/20/2024) 30 tablet 3   metoprolol  tartrate (LOPRESSOR ) 25 MG tablet Take 0.5 tablets (12.5 mg total) by mouth 2 (two) times daily.     Multiple Vitamin (MULTIVITAMIN) capsule Take 1 capsule by mouth daily.     rosuvastatin  (CRESTOR ) 20 MG tablet Take 1 tablet (20 mg total) by mouth daily. 90 tablet 3  tadalafil  (CIALIS ) 10 MG tablet Take 10mg  daily, with an additional 10mg  dose as needed 1 hour prior to sexual activity 90 tablet 3   tamsulosin  (FLOMAX ) 0.4 MG CAPS capsule Take 1 capsule (0.4 mg total) by mouth daily. 90 capsule 3   No current facility-administered medications for this visit.    Allergies  Allergen Reactions   Penicillins     Rash Rash     Family History  Problem Relation Age of Onset   Hyperlipidemia Mother    Hypertension Mother    Heart disease Mother    Heart disease Sister        valve repair    Diabetes Brother    Hypertension Brother     Social History   Socioeconomic History   Marital status: Married    Spouse name: Not on file   Number of children: Not  on file   Years of education: Not on file   Highest education level: Associate degree: academic program  Occupational History   Not on file  Tobacco Use   Smoking status: Former    Current packs/day: 0.00    Average packs/day: 1 pack/day for 20.0 years (20.0 ttl pk-yrs)    Types: Cigarettes    Start date: 02/01/1973    Quit date: 02/01/1993    Years since quitting: 31.3    Passive exposure: Past   Smokeless tobacco: Never  Vaping Use   Vaping status: Never Used  Substance and Sexual Activity   Alcohol use: Not Currently    Comment: social drinker.   Drug use: No   Sexual activity: Not on file  Other Topics Concern   Not on file  Social History Narrative   Participates in church activities, and food bank 3 days a week   Gym 2 times weekly- treadmill    Social Drivers of Health   Financial Resource Strain: Low Risk  (04/26/2024)   Received from [facility]   Overall Financial Resource Strain (CARDIA)    Difficulty of Paying Living Expenses: Not hard at all  Food Insecurity: No Food Insecurity (04/26/2024)   Received from [facility]   Hunger Vital Sign    Worried About Running Out of Food in the Last Year: Never true    Ran Out of Food in the Last Year: Never true  Transportation Needs: No Transportation Needs (04/26/2024)   Received [facility] - [facility]    In the past 12 months, has lack of transportation kept you from medical appointments or from getting medications?: No    Lack of Transportation (Non-Medical): No  Physical Activity: Sufficiently Active (12/10/2023)   Exercise Vital Sign    Days of Exercise per Week: 7 days    Minutes of Exercise per Session: 60 min  Stress: No Stress Concern Present (12/10/2023)   [employer] of Occupational Health - Occupational Stress Questionnaire    Feeling of Stress : Not at all  Social Connections: Moderately Integrated (12/10/2023)   Social  Connection and Isolation Panel [NHANES]    Frequency of Communication with Friends and Family: More than three times a week    Frequency of Social Gatherings with Friends and Family: More than three times a week    Attends Religious Services: More than 4 times per year    Active Member of [employer] or Organizations: No    Attends Banker Meetings: Never    Marital Status: Married  Catering manager Violence:  Not At Risk (12/10/2023)   Humiliation, Afraid, Rape, and Kick questionnaire    Fear of Current or Ex-Partner: No    Emotionally Abused: No    Physically Abused: No    Sexually Abused: No     Constitutional: Denies fever, malaise, fatigue, headache or abrupt weight changes.  HEENT: Pt reports stye of right eye, eye tearing, periorbital swelling, runny nose, nasal congestion. Denies eye pain, eye redness, ear pain, ringing in the ears, wax buildup, runny nose, bloody nose, or sore throat. Respiratory: Denies difficulty breathing, shortness of breath, cough or sputum production.   Cardiovascular: Denies chest pain, chest tightness, palpitations or swelling in the hands or feet.   No other specific complaints in a complete review of systems (except as listed in HPI above).      Objective:   Physical Exam  BP 118/72 (BP Location: Right Arm, Patient Position: Sitting, Cuff Size: Normal)   Ht 5\' 11"  (1.803 m)   Wt 179 lb 9.6 oz (81.5 kg)   BMI 25.05 kg/m   Wt Readings from Last 3 Encounters:  03/20/24 178 lb 9.6 oz (81 kg)  02/14/24 181 lb (82.1 kg)  02/08/24 180 lb (81.6 kg)    General: Appears his stated age, well developed, well nourished in NAD. Skin: Warm, dry and intact.  HEENT: Head: normal shape and size; Eyes: sclera white, no icterus, conjunctiva pink, PERRLA and EOMs intact, nodule noted at central upper eyelid without a head; Ears: Tm's gray and intact, normal light reflex; Nose: mucosa pink and moist, septum midline; Throat/Mouth: Teeth present, mucosa  pink and moist, no exudate, lesions or ulcerations noted.  Neck:  No adenopathy noted.  Cardiovascular: Normal rate and rhythm.  Pulmonary/Chest: Normal effort . Neurological: Alert and oriented.   BMET    Component Value Date/Time   NA 144 06/22/2023 0816   NA 148 (H) 03/23/2022 1000   NA 140 10/26/2013 1132   K 3.9 06/22/2023 0816   K 4.1 10/26/2013 1132   CL 110 06/22/2023 0816   CL 111 (H) 10/26/2013 1132   CO2 28 06/22/2023 0816   CO2 28 10/26/2013 1132   GLUCOSE 101 (H) 06/22/2023 0816   GLUCOSE 100 (H) 10/26/2013 1132   BUN 17 06/22/2023 0816   BUN 16 03/23/2022 1000   BUN 15 10/26/2013 1132   CREATININE 1.19 06/22/2023 0816   CALCIUM  9.1 06/22/2023 0816   CALCIUM  8.6 10/26/2013 1132   GFRNONAA >60 01/05/2023 0031   GFRNONAA 53 (L) 08/30/2020 0837   GFRAA 61 08/30/2020 0837    Lipid Panel     Component Value Date/Time   CHOL 138 06/22/2023 0816   CHOL 170 03/23/2022 1000   TRIG 58 06/22/2023 0816   HDL 53 06/22/2023 0816   HDL 59 03/23/2022 1000   CHOLHDL 2.6 06/22/2023 0816   VLDL 18 10/13/2016 0827   LDLCALC 72 06/22/2023 0816    CBC    Component Value Date/Time   WBC 4.0 06/22/2023 0816   RBC 4.30 06/22/2023 0816   HGB 13.5 06/22/2023 0816   HGB 14.8 05/01/2016 0846   HCT 39.2 06/22/2023 0816   HCT 41.2 05/01/2016 0846   PLT 109 (L) 06/22/2023 0816   PLT 129 (L) 05/01/2016 0846   MCV 91.2 06/22/2023 0816   MCV 86 05/01/2016 0846   MCV 87 10/26/2013 1132   MCH 31.4 06/22/2023 0816   MCHC 34.4 06/22/2023 0816   RDW 14.2 06/22/2023 0816   RDW 14.8 05/01/2016 0846  RDW 14.7 (H) 10/26/2013 1132   LYMPHSABS 1,356 06/22/2023 0816   LYMPHSABS 1.5 05/01/2016 0846   LYMPHSABS 2.7 10/26/2013 1132   MONOABS 0.4 01/05/2023 0031   MONOABS 0.5 10/26/2013 1132   EOSABS 92 06/22/2023 0816   EOSABS 0.2 05/01/2016 0846   EOSABS 0.2 10/26/2013 1132   BASOSABS 40 06/22/2023 0816   BASOSABS 0.0 05/01/2016 0846   BASOSABS 0.1 10/26/2013 1132    Hgb  A1C Lab Results  Component Value Date   HGBA1C 5.6 06/22/2023            Assessment & Plan:   Assessment and Plan    Hordeolum (Stye) Swollen, tender right eyelid consistent with hordeolum. No conjunctivitis. Explained potential progression to chalazion requiring ophthalmological intervention. - Prescribed erythromycin eye ointment for right eye at bedtime for 5-7 days. - Instructed warm compresses to affected eyelid twice daily. - Advised gentle eyelid massage post-compress to encourage drainage.       Followup with your PCP as previously scheduled Helayne Lo, NP

## 2024-05-30 NOTE — Patient Instructions (Signed)
 Stye A stye, also known as a hordeolum, is a bump that forms on an eyelid. It may look like a pimple next to the eyelash. A stye can form inside the eyelid (internal stye) or outside the eyelid (external stye). A stye can cause redness, swelling, and pain on the eyelid. Styes are very common. Anyone can get them at any age. They usually occur in just one eye at a time, but you may have more than one in either eye. What are the causes? A stye is caused by an infection. The infection is almost always caused by bacteria called Staphylococcus aureus. This is a common type of bacteria that lives on the skin. An internal stye may result from an infected oil-producing gland inside the eyelid. An external stye may be caused by an infection at the base of the eyelash (hair follicle). What increases the risk? You are more likely to develop a stye if: You have had a stye before. You have any of these conditions: Red, itchy, inflamed eyelids (blepharitis). A skin condition such as seborrheic dermatitis or rosacea. High fat levels in your blood (lipids). Dry eyes. What are the signs or symptoms? The most common symptom of a stye is eyelid pain. Internal styes are more painful than external styes. Other symptoms may include: Painful swelling of your eyelid. A scratchy feeling in your eye. Tearing and redness of your eye. A pimple-like bump on the edge of the eyelid. Pus draining from the stye. How is this diagnosed? Your health care provider may be able to diagnose a stye just by examining your eye. The health care provider may also check to make sure: You do not have a fever or other signs of a more serious infection. The infection has not spread to other parts of your eye or areas around your eye. How is this treated? Most styes will clear up in a few days without treatment or with warm compresses applied to the area. You may need to use antibiotic drops or ointment to treat an infection. Sometimes,  steroid drops or ointment are used in addition to antibiotics. In some cases, your health care provider may give you a small steroid injection in the eyelid. If your stye does not heal with routine treatment, your health care provider may drain pus from the stye using a thin blade or needle. This may be done if the stye is large, causing a lot of pain, or affecting your vision. Follow these instructions at home: Take over-the-counter and prescription medicines only as told by your health care provider. This includes eye drops or ointments. If you were prescribed an antibiotic medicine, steroid medicine, or both, apply or use them as told by your health care provider. Do not stop using the medicine even if your condition improves. Apply a warm, wet cloth (warm compress) to your eye for 5-10 minutes, 4 to 6 times a day. Clean the affected eyelid as directed by your health care provider. Do not wear contact lenses or eye makeup until your stye has healed and your health care provider says that it is safe. Do not try to pop or drain the stye. Do not rub your eye. Contact a health care provider if: You have chills or a fever. Your stye does not go away after several days. Your stye affects your vision. Your eyeball becomes swollen, red, or painful. Get help right away if: You have pain when moving your eye around. Summary A stye is a bump that forms  on an eyelid. It may look like a pimple next to the eyelash. A stye can form inside the eyelid (internal stye) or outside the eyelid (external stye). A stye can cause redness, swelling, and pain on the eyelid. Your health care provider may be able to diagnose a stye just by examining your eye. Apply a warm, wet cloth (warm compress) to your eye for 5-10 minutes, 4 to 6 times a day. This information is not intended to replace advice given to you by your health care provider. Make sure you discuss any questions you have with your health care  provider. Document Revised: 02/12/2021 Document Reviewed: 02/12/2021 Elsevier Patient Education  2024 [employer].

## 2024-06-20 ENCOUNTER — Ambulatory Visit: Admitting: Family Medicine

## 2024-06-20 ENCOUNTER — Telehealth: Payer: Self-pay | Admitting: Family Medicine

## 2024-06-20 DIAGNOSIS — H00011 Hordeolum externum right upper eyelid: Secondary | ICD-10-CM

## 2024-06-20 NOTE — Telephone Encounter (Signed)
 Patient was recently seen 05/30/24 for R upper external hordeolum by Angeline Laura, FNP, treated with conservative measures compress recommendations and rx erythromycin  ophthal ointment. He has now returned for follow-up 3 weeks later. Symptoms have not resolved. He had an apt at 1120 today for acute work in but did not arrive until 1138 today. I advised that I would be willing to refer him after discussing the case with Angeline Laura, FNP. Given lack of improvement 3 weeks after antibiotic ointment and compresses, referral would be next option. He preferred Dr Lavonia in [facility].  -----  Reason for Referral: referral to Ophthalmology for Right upper external hordeolum not resolving with conservative measures >4 weeks. Requesting further evaluation and treatment.  Has the referral been discussed with the patient?: yes  Designated contact for the referral if not the patient (name/phone number):  Has the patient seen a specialist for this issue before?: No  If so, who (practice/provider)?  Does the patient have a provider or location preference for the referral?: Yes Dr Evalene Lavonia [facility] [address] [place] [place], [place] [address] [phone] Would the patient like to see previous specialist if applicable?   Marsa Officer, DO [facility] 06/20/2024, 11:46 AM

## 2024-06-27 ENCOUNTER — Other Ambulatory Visit: Payer: Medicare Other

## 2024-06-27 DIAGNOSIS — I129 Hypertensive chronic kidney disease with stage 1 through stage 4 chronic kidney disease, or unspecified chronic kidney disease: Secondary | ICD-10-CM

## 2024-06-27 DIAGNOSIS — E782 Mixed hyperlipidemia: Secondary | ICD-10-CM

## 2024-06-27 DIAGNOSIS — Z7901 Long term (current) use of anticoagulants: Secondary | ICD-10-CM

## 2024-06-27 DIAGNOSIS — Z0000 Encounter for general adult medical examination without abnormal findings: Secondary | ICD-10-CM

## 2024-06-27 DIAGNOSIS — I480 Paroxysmal atrial fibrillation: Secondary | ICD-10-CM

## 2024-06-27 DIAGNOSIS — R972 Elevated prostate specific antigen [PSA]: Secondary | ICD-10-CM

## 2024-06-27 DIAGNOSIS — N401 Enlarged prostate with lower urinary tract symptoms: Secondary | ICD-10-CM

## 2024-06-27 DIAGNOSIS — R7309 Other abnormal glucose: Secondary | ICD-10-CM

## 2024-06-27 DIAGNOSIS — E038 Other specified hypothyroidism: Secondary | ICD-10-CM

## 2024-06-28 LAB — CBC WITH DIFFERENTIAL/PLATELET
Absolute Lymphocytes: 1679 {cells}/uL (ref 850–3900)
Absolute Monocytes: 423 {cells}/uL (ref 200–950)
Basophils Absolute: 50 {cells}/uL (ref 0–200)
Basophils Relative: 1.1 %
Eosinophils Absolute: 113 {cells}/uL (ref 15–500)
Eosinophils Relative: 2.5 %
HCT: 43.5 % (ref 38.5–50.0)
Hemoglobin: 14.7 g/dL (ref 13.2–17.1)
MCH: 31.7 pg (ref 27.0–33.0)
MCHC: 33.8 g/dL (ref 32.0–36.0)
MCV: 93.8 fL (ref 80.0–100.0)
MPV: 10.9 fL (ref 7.5–12.5)
Monocytes Relative: 9.4 %
Neutro Abs: 2237 {cells}/uL (ref 1500–7800)
Neutrophils Relative %: 49.7 %
Platelets: 133 Thousand/uL — ABNORMAL LOW (ref 140–400)
RBC: 4.64 Million/uL (ref 4.20–5.80)
RDW: 14.4 % (ref 11.0–15.0)
Total Lymphocyte: 37.3 %
WBC: 4.5 Thousand/uL (ref 3.8–10.8)

## 2024-06-28 LAB — COMPLETE METABOLIC PANEL WITHOUT GFR
AG Ratio: 1.6 (calc) (ref 1.0–2.5)
ALT: 16 U/L (ref 9–46)
AST: 20 U/L (ref 10–35)
Albumin: 3.8 g/dL (ref 3.6–5.1)
Alkaline phosphatase (APISO): 77 U/L (ref 35–144)
BUN/Creatinine Ratio: 14 (calc) (ref 6–22)
BUN: 18 mg/dL (ref 7–25)
CO2: 28 mmol/L (ref 20–32)
Calcium: 9.1 mg/dL (ref 8.6–10.3)
Chloride: 109 mmol/L (ref 98–110)
Creat: 1.29 mg/dL — ABNORMAL HIGH (ref 0.70–1.28)
Globulin: 2.4 g/dL (ref 1.9–3.7)
Glucose, Bld: 99 mg/dL (ref 65–99)
Potassium: 3.9 mmol/L (ref 3.5–5.3)
Sodium: 143 mmol/L (ref 135–146)
Total Bilirubin: 0.8 mg/dL (ref 0.2–1.2)
Total Protein: 6.2 g/dL (ref 6.1–8.1)

## 2024-06-28 LAB — LIPID PANEL
Cholesterol: 180 mg/dL (ref <200)
HDL: 60 mg/dL (ref >=40)
LDL Cholesterol (Calc): 104 mg/dL — ABNORMAL HIGH
Non-HDL Cholesterol (Calc): 120 mg/dL (ref <130)
Total CHOL/HDL Ratio: 3 (calc) (ref <5.0)
Triglycerides: 73 mg/dL (ref <150)

## 2024-06-28 LAB — T4, FREE: Free T4: 0.9 ng/dL (ref 0.8–1.8)

## 2024-06-28 LAB — HEMOGLOBIN A1C
Hgb A1c MFr Bld: 5.6 % (ref <5.7)
Mean Plasma Glucose: 114 mg/dL
eAG (mmol/L): 6.3 mmol/L

## 2024-06-28 LAB — TSH: TSH: 0.4 m[IU]/L (ref 0.40–4.50)

## 2024-06-28 LAB — PSA: PSA: 8.03 ng/mL — ABNORMAL HIGH (ref <=4.00)

## 2024-06-30 ENCOUNTER — Ambulatory Visit: Payer: Self-pay | Admitting: Family Medicine

## 2024-07-04 ENCOUNTER — Encounter: Payer: Self-pay | Admitting: Family Medicine

## 2024-07-24 ENCOUNTER — Ambulatory Visit: Payer: Self-pay

## 2024-07-24 NOTE — Telephone Encounter (Signed)
  FYI Only or Action Required?: Action required by provider: request for appointment.  Patient was last seen in primary care on 05/30/2024 by Antonette Angeline ORN, NP.  Called Nurse Triage reporting Back Pain.  Symptoms began a week ago.  Interventions attempted: Nothing.  Symptoms are: unchanged.No known injury. Hurts with movement.  Triage Disposition: See PCP When Office is Open (Within 3 Days)  Patient/caregiver understands and will follow disposition?: Yes  Copied from CRM [id number]. Topic: Clinical - Red Word Triage >> Jul 24, 2024 10:49 AM Delon HERO wrote: Red Word that prompted transfer to Nurse Triage: Patient is calling to report back pain that started last Tuesday or Wednesday. With no injury - no fall. Reason for Disposition  [1] MODERATE back pain (e.g., interferes with normal activities) AND [2] present > 3 days  Answer Assessment - Initial Assessment Questions 1. ONSET: When did the pain begin? (e.g., minutes, hours, days)     Last week 2. LOCATION: Where does it hurt? (upper, mid or lower back)     Middle of back 3. SEVERITY: How bad is the pain?  (e.g., Scale 1-10; mild, moderate, or severe)     3 4. PATTERN: Is the pain constant? (e.g., yes, no; constant, intermittent)      With movement 5. RADIATION: Does the pain shoot into your legs or somewhere else?     no 6. CAUSE:  What do you think is causing the back pain?      unsure 7. BACK OVERUSE:  Any recent lifting of heavy objects, strenuous work or exercise?     no 8. MEDICINES: What have you taken so far for the pain? (e.g., nothing, acetaminophen, NSAIDS)     no 9. NEUROLOGIC SYMPTOMS: Do you have any weakness, numbness, or problems with bowel/bladder control?     no 10. OTHER SYMPTOMS: Do you have any other symptoms? (e.g., fever, abdomen pain, burning with urination, blood in urine)       no 11. PREGNANCY: Is there any chance you are pregnant? When was your last menstrual period?        N/a  Protocols used: Back Pain-A-AH

## 2024-07-24 NOTE — Telephone Encounter (Signed)
 Will discuss at upcoming appt.

## 2024-07-25 ENCOUNTER — Encounter: Payer: Self-pay | Admitting: Family Medicine

## 2024-07-25 ENCOUNTER — Ambulatory Visit: Admitting: Internal Medicine

## 2024-07-25 ENCOUNTER — Ambulatory Visit: Admitting: Family Medicine

## 2024-07-25 VITALS — BP 150/90 | HR 49 | Ht 71.0 in | Wt 180.2 lb

## 2024-07-25 DIAGNOSIS — R972 Elevated prostate specific antigen [PSA]: Secondary | ICD-10-CM

## 2024-07-25 DIAGNOSIS — M545 Low back pain, unspecified: Secondary | ICD-10-CM

## 2024-07-25 NOTE — Patient Instructions (Addendum)

## 2024-07-25 NOTE — Progress Notes (Signed)
 Subjective:    Patient ID: Andrew Benson, male    DOB: 03-23-1946, 78 y.o.   MRN: [id number]  Andrew Benson is a 78 y.o. male presenting on 07/25/2024 for Back Pain (About 1 week of back pain)  Patient presents for a same day appointment.  HPI  Discussed the use of AI scribe software for clinical note transcription with the patient, who gave verbal consent to proceed.  History of Present Illness   [name] is a 78 year old male who presents with acute low back pain.  Acute low back pain, Left sided without sciatica - Onset approximately one week ago with sudden onset around last Tuesday - Pain is primarily located on the left side of the lower back - Pain has progressively worsened, with severe episodes resulting in inability to get up from the toilet - Pain is exacerbated by certain movements, such as getting up or changing positions - Pain is variable, with some days worse than others; yesterday was particularly severe, today less so but still present - Today improving overall - No radiating pain down the legs - No prior imaging of the back - No history of spine surgery - Has not used any medications for pain due to inability to find Tylenol at home - Tried using a heating pad without relief    - Denies any fevers/chills, numbness, tingling, weakness, loss of control bladder/bowel incontinence or retention, unintentional wt loss, night sweats  Elevated PSA Asking about future follow-up  Waiting for Ortho for his shoulder surgery.     07/25/2024   11:05 AM 07/25/2024   11:04 AM 03/20/2024    1:39 PM  Depression screen PHQ 2/9  Decreased Interest 0 0 0  Down, Depressed, Hopeless 0 0 0  PHQ - 2 Score 0 0 0  Altered sleeping 0  0  Tired, decreased energy 0  0  Change in appetite 0  0  Feeling bad or failure about yourself  0  0  Trouble concentrating 0  0  Moving slowly or fidgety/restless 1  0  Suicidal thoughts 0  0  PHQ-9 Score 1  0  Difficult doing  work/chores Not difficult at all  Not difficult at all       07/25/2024   11:05 AM 03/20/2024    1:39 PM 07/01/2023    9:18 AM 02/03/2023    2:45 PM  GAD 7 : Generalized Anxiety Score  Nervous, Anxious, on Edge 0 0 0 0  Control/stop worrying 0 0 0 0  Worry too much - different things 0 0 0 0  Trouble relaxing 0 0 0 0  Restless 0 0 0 0  Easily annoyed or irritable 0 0 0 0  Afraid - awful might happen 0 0 0 0  Total GAD 7 Score 0 0 0 0  Anxiety Difficulty  Not difficult at all Not difficult at all Not difficult at all    Social History   Tobacco Use   Smoking status: Former    Current packs/day: 0.00    Average packs/day: 1 pack/day for 20.0 years (20.0 ttl pk-yrs)    Types: Cigarettes    Start date: 02/01/1973    Quit date: 02/01/1993    Years since quitting: 31.4    Passive exposure: Past   Smokeless tobacco: Never  Vaping Use   Vaping status: Never Used  Substance Use Topics   Alcohol use: Not Currently    Comment: social drinker.   Drug use: No  Review of Systems Per HPI unless specifically indicated above     Objective:    BP (!) 150/90 (BP Location: Right Arm, Patient Position: Sitting, Cuff Size: Normal)   Pulse (!) 49   Ht 5' 11 (1.803 m)   Wt 180 lb 4 oz (81.8 kg)   SpO2 98%   BMI 25.14 kg/m   Wt Readings from Last 3 Encounters:  07/25/24 180 lb 4 oz (81.8 kg)  05/30/24 179 lb 9.6 oz (81.5 kg)  03/20/24 178 lb 9.6 oz (81 kg)    Physical Exam Vitals and nursing note reviewed.  Constitutional:      General: He is not in acute distress.    Appearance: Normal appearance. He is well-developed. He is not diaphoretic.     Comments: Well-appearing, comfortable, cooperative  HENT:     Head: Normocephalic and atraumatic.  Eyes:     General:        Right eye: No discharge.        Left eye: No discharge.     Conjunctiva/sclera: Conjunctivae normal.  Cardiovascular:     Rate and Rhythm: Normal rate.  Pulmonary:     Effort: Pulmonary effort is normal.   Musculoskeletal:     Comments: Low Back Inspection: Normal appearance, normal body habitus, no spinal deformity, symmetrical. Palpation: No tenderness over spinous processes. Bilateral lumbar paraspinal muscles non-tender and without hypertonicity/spasm. ROM: Full active ROM forward flex / back extension, rotation L/R without discomfort Special Testing: Seated SLR provoked some left low back pain but no radicular pain. Strength: Bilateral hip flex/ext 5/5, knee flex/ext 5/5, ankle dorsiflex/plantarflex 5/5 Neurovascular: intact distal sensation to light touch   Skin:    General: Skin is warm and dry.     Findings: No erythema or rash.  Neurological:     Mental Status: He is alert and oriented to person, place, and time.  Psychiatric:        Mood and Affect: Mood normal.        Behavior: Behavior normal.        Thought Content: Thought content normal.     Comments: Well groomed, good eye contact, normal speech and thoughts     Results for orders placed or performed in visit on 06/27/24  T4, free   Collection Time: 06/27/24  8:05 AM  Result Value Ref Range   Free T4 0.9 0.8 - 1.8 ng/dL  TSH   Collection Time: 06/27/24  8:05 AM  Result Value Ref Range   TSH 0.40 0.40 - 4.50 mIU/L  PSA   Collection Time: 06/27/24  8:05 AM  Result Value Ref Range   PSA 8.03 (H) < OR = 4.00 ng/mL  Lipid panel   Collection Time: 06/27/24  8:05 AM  Result Value Ref Range   Cholesterol 180 <200 mg/dL   HDL 60 > OR = 40 mg/dL   Triglycerides 73 <849 mg/dL   LDL Cholesterol (Calc) 104 (H) mg/dL (calc)   Total CHOL/HDL Ratio 3.0 <5.0 (calc)   Non-HDL Cholesterol (Calc) 120 <130 mg/dL (calc)  Hemoglobin J8r   Collection Time: 06/27/24  8:05 AM  Result Value Ref Range   Hgb A1c MFr Bld 5.6 <5.7 %   Mean Plasma Glucose 114 mg/dL   eAG (mmol/L) 6.3 mmol/L  CBC with Differential/Platelet   Collection Time: 06/27/24  8:05 AM  Result Value Ref Range   WBC 4.5 3.8 - 10.8 Thousand/uL   RBC 4.64  4.20 - 5.80 Million/uL   Hemoglobin 14.7 13.2 -  17.1 g/dL   HCT 56.4 61.4 - 49.9 %   MCV 93.8 80.0 - 100.0 fL   MCH 31.7 27.0 - 33.0 pg   MCHC 33.8 32.0 - 36.0 g/dL   RDW 85.5 88.9 - 84.9 %   Platelets 133 (L) 140 - 400 Thousand/uL   MPV 10.9 7.5 - 12.5 fL   Neutro Abs 2,237 1,500 - 7,800 cells/uL   Absolute Lymphocytes 1,679 850 - 3,900 cells/uL   Absolute Monocytes 423 200 - 950 cells/uL   Eosinophils Absolute 113 15 - 500 cells/uL   Basophils Absolute 50 0 - 200 cells/uL   Neutrophils Relative % 49.7 %   Total Lymphocyte 37.3 %   Monocytes Relative 9.4 %   Eosinophils Relative 2.5 %   Basophils Relative 1.1 %  COMPLETE METABOLIC PANEL WITH GFR   Collection Time: 06/27/24  8:05 AM  Result Value Ref Range   Glucose, Bld 99 65 - 99 mg/dL   BUN 18 7 - 25 mg/dL   Creat 8.70 (H) 9.29 - 1.28 mg/dL   BUN/Creatinine Ratio 14 6 - 22 (calc)   Sodium 143 135 - 146 mmol/L   Potassium 3.9 3.5 - 5.3 mmol/L   Chloride 109 98 - 110 mmol/L   CO2 28 20 - 32 mmol/L   Calcium  9.1 8.6 - 10.3 mg/dL   Total Protein 6.2 6.1 - 8.1 g/dL   Albumin 3.8 3.6 - 5.1 g/dL   Globulin 2.4 1.9 - 3.7 g/dL (calc)   AG Ratio 1.6 1.0 - 2.5 (calc)   Total Bilirubin 0.8 0.2 - 1.2 mg/dL   Alkaline phosphatase (APISO) 77 35 - 144 U/L   AST 20 10 - 35 U/L   ALT 16 9 - 46 U/L      Assessment & Plan:   Problem List Items Addressed This Visit     Elevated PSA   Other Visit Diagnoses       Acute left-sided low back pain without sciatica    -  Primary        Acute left-sided low back pain Severe left-sided low back pain likely due to arthritis flare-up or muscular strain. No radiating pain or injury. X-ray deferred unless symptoms persist or worsen. No conservative treatment tried and already improving now. - Recommend Tylenol extra strength as needed for pain. - Advise use of heating pad and muscle rub for relief. - Encourage moderate activity to prevent symptom exacerbation. - Consider x-ray if symptoms  persist or worsen. - Offer other medication muscle relaxant NSAID, he declines today  Benign prostatic hyperplasia with elevated PSA, low suspicion for malignancy Followed by urology Dr Francisca, after Dr Penne left. He had recent elevated PSA 8 range, and awaiting repeat test Elevated PSA with MRI showing prostate enlargement, no malignancy. Low PSA density, low cancer suspicion. - According to urology recommendation, repeat PSA total plus free percentage in 2-3 months.      Outside message to Gustavo Level, FYI patient interested in shoulder surgery w/ Dr Edie, he called 2 weeks ago but waiting to hear back.   No orders of the defined types were placed in this encounter.   No orders of the defined types were placed in this encounter.   Follow up plan: Return if symptoms worsen or fail to improve.   re-check PSA Total + Free% in 2-3 months   Marsa Officer, DO [facility] 07/25/2024, 11:12 AM

## 2024-07-27 ENCOUNTER — Encounter: Payer: Self-pay | Admitting: Urology

## 2024-08-02 ENCOUNTER — Ambulatory Visit (INDEPENDENT_AMBULATORY_CARE_PROVIDER_SITE_OTHER): Admitting: Family Medicine

## 2024-08-02 ENCOUNTER — Encounter: Payer: Self-pay | Admitting: Family Medicine

## 2024-08-02 VITALS — BP 122/70 | HR 65 | Ht 71.0 in | Wt 178.1 lb

## 2024-08-02 DIAGNOSIS — N138 Other obstructive and reflux uropathy: Secondary | ICD-10-CM

## 2024-08-02 DIAGNOSIS — Z0000 Encounter for general adult medical examination without abnormal findings: Secondary | ICD-10-CM

## 2024-08-02 DIAGNOSIS — I480 Paroxysmal atrial fibrillation: Secondary | ICD-10-CM

## 2024-08-02 DIAGNOSIS — D696 Thrombocytopenia, unspecified: Secondary | ICD-10-CM | POA: Insufficient documentation

## 2024-08-02 DIAGNOSIS — R972 Elevated prostate specific antigen [PSA]: Secondary | ICD-10-CM | POA: Diagnosis not present

## 2024-08-02 DIAGNOSIS — E038 Other specified hypothyroidism: Secondary | ICD-10-CM

## 2024-08-02 DIAGNOSIS — I129 Hypertensive chronic kidney disease with stage 1 through stage 4 chronic kidney disease, or unspecified chronic kidney disease: Secondary | ICD-10-CM

## 2024-08-02 DIAGNOSIS — K635 Polyp of colon: Secondary | ICD-10-CM

## 2024-08-02 DIAGNOSIS — N401 Enlarged prostate with lower urinary tract symptoms: Secondary | ICD-10-CM | POA: Diagnosis not present

## 2024-08-02 DIAGNOSIS — Z7901 Long term (current) use of anticoagulants: Secondary | ICD-10-CM

## 2024-08-02 DIAGNOSIS — N182 Chronic kidney disease, stage 2 (mild): Secondary | ICD-10-CM

## 2024-08-02 DIAGNOSIS — E782 Mixed hyperlipidemia: Secondary | ICD-10-CM

## 2024-08-02 NOTE — Patient Instructions (Addendum)
 Thank you for coming to the office today.  Referral to [facility] for Colonoscopy repeat. Now 3 years later.  Updated vaccine record  Flu Shot and COVID Booster in the Fall 2025  PSA lab today to recheck and add the extra test.  I will send results to Dr Francisca for review.  DUE for FASTING BLOOD WORK (no food or drink after midnight before the lab appointment, only water or coffee without cream/sugar on the morning of)  SCHEDULE Lab Only visit in the morning at the clinic for lab draw in 1 YEAR  - Make sure Lab Only appointment is at about 1 week before your next appointment, so that results will be available  For Lab Results, once available within 2-3 days of blood draw, you can can log in to MyChart online to view your results and a brief explanation. Also, we can discuss results at next follow-up visit.   Please schedule a Follow-up Appointment to: Return in about 1 year (around 08/02/2025) for 1 year fasting lab > 1 week later Annual Physical.  If you have any other questions or concerns, please feel free to call the office or send a message through MyChart. You may also schedule an earlier appointment if necessary.  Additionally, you may be receiving a survey about your experience at our office within a few days to 1 week by e-mail or mail. We value your feedback.  Marsa Officer, DO [facility], [place]

## 2024-08-02 NOTE — Progress Notes (Signed)
 Subjective:    Patient ID: Andrew Benson, male    DOB: March 26, 1946, 78 y.o.   MRN: [id number]  Andrew Benson is a 78 y.o. male presenting on 08/02/2024 for Annual Exam   HPI  Discussed the use of AI scribe software for clinical note transcription with the patient, who gave verbal consent to proceed.  History of Present Illness   Andrew Benson is a 78 year old male who presents for an annual physical exam and follow-up on elevated PSA levels. He is accompanied by his wife.   CHRONIC HTN / CKD-II Paroxysmal Atrial Fibrillation Followed by Cardiology On anticoagulation Eliquis  5mg  TWICE A DAY Not taking Amiodarone , has if AS NEEDED need    Last lab Creatinine maintained level 1.2-1.3 range, stable Checks BP often at home, low normal readings in morning. Current Meds - Metoprolol  25 TWICE A DAY Off ASA, Lisinopril  Reports good compliance, took meds today. Tolerating well, w/o complaints. Denies CP, dyspnea, HA, edema, dizziness / lightheadedness, bleeding problem or bruising     History of Central Hypothyroidism - Followed previously by [facility]ocrin[facility] Dr Damian back in 2018 He had Low TSH, Low T3, and Low-normal T4, and low-normal update on thyroid  scan. Other hormones pituitary normal. He was taken off of low dose Levothyroxine. - Last lab done 06/2024, normal range TSH T4 - Remains off levothyroxine - Today doing well no new concerns.   Elevated PSA / BPH Followed by Urology [facility] Recent lab shows PSA 8.03 (06/2024), prior range was 4.57 last year, and over past 7 years has been 4-5 range. No change in symptoms Due to this significant rise he was asked to repeat lab today   Elevated A1c 5.6, prior 5.3 to 5.5   HYPERLIPIDEMIA: - Reports no concerns. Last lipid panel 06/2024, controlled on statin but has had inc level to 104 LDL, attributed to missed doses of Statin - Currently taking Rosuvastatin  20mg , tolerating well without side effects or myalgias    Thrombocytopenia, chronic Stable Platelet counts, with mild thrombocytopenia, PLT 130 range He has no bleeding or bruising.     Health Maintenance:   Updated vaccines  - Plans to receive influenza vaccine in October - Considering COVID-19 booster in the fall    Colonoscopy last done 2022, w polyp, [facility]. Next due next 06/2024 needs order he prefers to repeat Colonoscopy       07/25/2024   11:05 AM 07/25/2024   11:04 AM 03/20/2024    1:39 PM  Depression screen PHQ 2/9  Decreased Interest 0 0 0  Down, Depressed, Hopeless 0 0 0  PHQ - 2 Score 0 0 0  Altered sleeping 0  0  Tired, decreased energy 0  0  Change in appetite 0  0  Feeling bad or failure about yourself  0  0  Trouble concentrating 0  0  Moving slowly or fidgety/restless 1  0  Suicidal thoughts 0  0  PHQ-9 Score 1  0  Difficult doing work/chores Not difficult at all  Not difficult at all       07/25/2024   11:05 AM 03/20/2024    1:39 PM 07/01/2023    9:18 AM 02/03/2023    2:45 PM  GAD 7 : Generalized Anxiety Score  Nervous, Anxious, on Edge 0 0 0 0  Control/stop worrying 0 0 0 0  Worry too much - different things 0 0 0 0  Trouble relaxing 0 0 0 0  Restless 0 0 0 0  Easily annoyed or irritable 0 0 0 0  Afraid - awful might happen 0 0 0 0  Total GAD 7 Score 0 0 0 0  Anxiety Difficulty  Not difficult at all Not difficult at all Not difficult at all     Past Medical History:  Diagnosis Date   Asthma    CKD (chronic kidney disease), stage II    Diastolic dysfunction    a. 12/2022 Echo: EF 55-60%, no rwma, GrI DD, mild LVH, nl RV fxn, Ao sclerosis. Ao root 40mm.   Dilated aortic root (HCC)    a. 12/2022 Echo: Ao root 40mm.   Essential hypertension    History of cardiovascular stress test    a. 12/2022 Ex MV: EF 34%, freq PVCs, no ST/T changes, HTN response (211/98), no isch/infarct. CT attenuation images w/o significant Ao or Cor atherosclerosis.   Hyperlipidemia    NSVT (nonsustained ventricular  tachycardia) (HCC)    PAF (paroxysmal atrial fibrillation) (HCC)    a. CHA2DS2VASc = 3;  b. takes amio/eliquis /metoprolol  prn for PAF.   Palpitations    a. 11/2022 Zio: Predominantly sinus rhythm at 65 (40-255).  4 runs of NSVT, longest 8 beats (162), fastest 255 (6 beats). 8 SVT runs, longests 15 beats (127), fastest 143 5 beats). Rare PACs/PVCs.   PSVT (paroxysmal supraventricular tachycardia) (HCC)    TMJ locking    Past Surgical History:  Procedure Laterality Date   COLONOSCOPY WITH PROPOFOL  N/A 12/29/2017   Procedure: COLONOSCOPY WITH PROPOFOL ;  Surgeon: Viktoria Lamar DASEN, MD;  Location: [facility] ENDOSCOPY;  Service: Endoscopy;  Laterality: N/A;   COLONOSCOPY WITH PROPOFOL  N/A 07/02/2021   Procedure: COLONOSCOPY WITH PROPOFOL ;  Surgeon: Toledo, Ladell POUR, MD;  Location: [facility] ENDOSCOPY;  Service: Gastroenterology;  Laterality: N/A;   FINGER SURGERY     left index finger   INGUINAL HERNIA REPAIR Right 2007   INGUINAL HERNIA REPAIR Left 2008   done x 2   KNEE SURGERY     left    Social History   Socioeconomic History   Marital status: Married    Spouse name: Not on file   Number of children: Not on file   Years of education: Not on file   Highest education level: Associate degree: academic program  Occupational History   Not on file  Tobacco Use   Smoking status: Former    Current packs/day: 0.00    Average packs/day: 1 pack/day for 20.0 years (20.0 ttl pk-yrs)    Types: Cigarettes    Start date: 02/01/1973    Quit date: 02/01/1993    Years since quitting: 31.5    Passive exposure: Past   Smokeless tobacco: Never  Vaping Use   Vaping status: Never Used  Substance and Sexual Activity   Alcohol use: Not Currently    Comment: social drinker.   Drug use: No   Sexual activity: Not on file  Other Topics Concern   Not on file  Social History Narrative   Participates in church activities, and food bank 3 days a week   Gym 2 times weekly- treadmill    Social Drivers of Health    Financial Resource Strain: Low Risk  (04/26/2024)   Received from [facility]   Overall Financial Resource Strain (CARDIA)    Difficulty of Paying Living Expenses: Not hard at all  Food Insecurity: No Food Insecurity (04/26/2024)   Received from [facility]   Hunger Vital Sign    Within the past 12  months, you worried that your food would run out before you got the money to buy more.: Never true    Within the past 12 months, the food you bought just didn't last and you didn't have money to get more.: Never true  Transportation Needs: No Transportation Needs (04/26/2024)   Received from [facility] - Transportation    In the past 12 months, has lack of transportation kept you from medical appointments or from getting medications?: No    Lack of Transportation (Non-Medical): No  Physical Activity: Sufficiently Active (12/10/2023)   Exercise Vital Sign    Days of Exercise per Week: 7 days    Minutes of Exercise per Session: 60 min  Stress: No Stress Concern Present (12/10/2023)   [employer] of Occupational Health - Occupational Stress Questionnaire    Feeling of Stress : Not at all  Social Connections: Moderately Integrated (12/10/2023)   Social Connection and Isolation Panel    Frequency of Communication with Friends and Family: More than three times a week    Frequency of Social Gatherings with Friends and Family: More than three times a week    Attends Religious Services: More than 4 times per year    Active Member of [employer] or Organizations: No    Attends Banker Meetings: Never    Marital Status: Married  Catering manager Violence: Not At Risk (12/10/2023)   Humiliation, Afraid, Rape, and Kick questionnaire    Fear of Current or Ex-Partner: No    Emotionally Abused: No    Physically Abused: No    Sexually Abused: No   Family History  Problem Relation Age of Onset   Hyperlipidemia Mother     Hypertension Mother    Heart disease Mother    Heart disease Sister        valve repair    Diabetes Brother    Hypertension Brother    Current Outpatient Medications on File Prior to Visit  Medication Sig   amiodarone  (PACERONE ) 200 MG tablet TAKE 1 TABLET (200 MG TOTAL) BY MOUTH DAILY AS NEEDED (TAKE FOR A FIB).   apixaban  (ELIQUIS ) 5 MG TABS tablet Take 1 tablet (5 mg total) by mouth 2 (two) times daily.   metoprolol  tartrate (LOPRESSOR ) 25 MG tablet Take 0.5 tablets (12.5 mg total) by mouth 2 (two) times daily.   Multiple Vitamin (MULTIVITAMIN) capsule Take 1 capsule by mouth daily.   rosuvastatin  (CRESTOR ) 20 MG tablet Take 1 tablet (20 mg total) by mouth daily.   tadalafil  (CIALIS ) 10 MG tablet Take 10mg  daily, with an additional 10mg  dose as needed 1 hour prior to sexual activity   No current facility-administered medications on file prior to visit.    Review of Systems  Constitutional:  Negative for activity change, appetite change, chills, diaphoresis, fatigue and fever.  HENT:  Negative for congestion and hearing loss.   Eyes:  Negative for visual disturbance.  Respiratory:  Negative for cough, chest tightness, shortness of breath and wheezing.   Cardiovascular:  Negative for chest pain, palpitations and leg swelling.  Gastrointestinal:  Negative for abdominal pain, constipation, diarrhea, nausea and vomiting.  Genitourinary:  Negative for dysuria, frequency and hematuria.  Musculoskeletal:  Negative for arthralgias and neck pain.  Skin:  Negative for rash.  Neurological:  Negative for dizziness, weakness, light-headedness, numbness and headaches.  Hematological:  Negative for adenopathy.  Psychiatric/Behavioral:  Negative for behavioral problems, dysphoric mood and sleep disturbance.    Per HPI  unless specifically indicated above     Objective:    BP 122/70 (BP Location: Left Arm, Patient Position: Sitting, Cuff Size: Normal)   Pulse 65   Ht 5' 11 (1.803 m)   Wt  178 lb 2 oz (80.8 kg)   SpO2 94%   BMI 24.84 kg/m   Wt Readings from Last 3 Encounters:  08/02/24 178 lb 2 oz (80.8 kg)  07/25/24 180 lb 4 oz (81.8 kg)  05/30/24 179 lb 9.6 oz (81.5 kg)    Physical Exam Vitals and nursing note reviewed.  Constitutional:      General: He is not in acute distress.    Appearance: He is well-developed. He is not diaphoretic.     Comments: Well-appearing, comfortable, cooperative  HENT:     Head: Normocephalic and atraumatic.  Eyes:     General:        Right eye: No discharge.        Left eye: No discharge.     Conjunctiva/sclera: Conjunctivae normal.     Pupils: Pupils are equal, round, and reactive to light.  Neck:     Thyroid : No thyromegaly.     Vascular: No carotid bruit.  Cardiovascular:     Rate and Rhythm: Normal rate and regular rhythm.     Pulses: Normal pulses.     Heart sounds: Normal heart sounds. No murmur heard. Pulmonary:     Effort: Pulmonary effort is normal. No respiratory distress.     Breath sounds: Normal breath sounds. No wheezing or rales.  Abdominal:     General: Bowel sounds are normal. There is no distension.     Palpations: Abdomen is soft. There is no mass.     Tenderness: There is no abdominal tenderness.  Musculoskeletal:        General: No tenderness. Normal range of motion.     Cervical back: Normal range of motion and neck supple.     Right lower leg: No edema.     Left lower leg: No edema.     Comments: Upper / Lower Extremities: - Normal muscle tone, strength bilateral upper extremities 5/5, lower extremities 5/5  Lymphadenopathy:     Cervical: No cervical adenopathy.  Skin:    General: Skin is warm and dry.     Findings: No erythema or rash.  Neurological:     Mental Status: He is alert and oriented to person, place, and time.     Comments: Distal sensation intact to light touch all extremities  Psychiatric:        Mood and Affect: Mood normal.        Behavior: Behavior normal.        Thought  Content: Thought content normal.     Comments: Well groomed, good eye contact, normal speech and thoughts     Results for orders placed or performed in visit on 06/27/24  T4, free   Collection Time: 06/27/24  8:05 AM  Result Value Ref Range   Free T4 0.9 0.8 - 1.8 ng/dL  TSH   Collection Time: 06/27/24  8:05 AM  Result Value Ref Range   TSH 0.40 0.40 - 4.50 mIU/L  PSA   Collection Time: 06/27/24  8:05 AM  Result Value Ref Range   PSA 8.03 (H) < OR = 4.00 ng/mL  Lipid panel   Collection Time: 06/27/24  8:05 AM  Result Value Ref Range   Cholesterol 180 <200 mg/dL   HDL 60 > OR = 40 mg/dL  Triglycerides 73 <150 mg/dL   LDL Cholesterol (Calc) 104 (H) mg/dL (calc)   Total CHOL/HDL Ratio 3.0 <5.0 (calc)   Non-HDL Cholesterol (Calc) 120 <130 mg/dL (calc)  Hemoglobin J8r   Collection Time: 06/27/24  8:05 AM  Result Value Ref Range   Hgb A1c MFr Bld 5.6 <5.7 %   Mean Plasma Glucose 114 mg/dL   eAG (mmol/L) 6.3 mmol/L  CBC with Differential/Platelet   Collection Time: 06/27/24  8:05 AM  Result Value Ref Range   WBC 4.5 3.8 - 10.8 Thousand/uL   RBC 4.64 4.20 - 5.80 Million/uL   Hemoglobin 14.7 13.2 - 17.1 g/dL   HCT 56.4 61.4 - 49.9 %   MCV 93.8 80.0 - 100.0 fL   MCH 31.7 27.0 - 33.0 pg   MCHC 33.8 32.0 - 36.0 g/dL   RDW 85.5 88.9 - 84.9 %   Platelets 133 (L) 140 - 400 Thousand/uL   MPV 10.9 7.5 - 12.5 fL   Neutro Abs 2,237 1,500 - 7,800 cells/uL   Absolute Lymphocytes 1,679 850 - 3,900 cells/uL   Absolute Monocytes 423 200 - 950 cells/uL   Eosinophils Absolute 113 15 - 500 cells/uL   Basophils Absolute 50 0 - 200 cells/uL   Neutrophils Relative % 49.7 %   Total Lymphocyte 37.3 %   Monocytes Relative 9.4 %   Eosinophils Relative 2.5 %   Basophils Relative 1.1 %  COMPLETE METABOLIC PANEL WITH GFR   Collection Time: 06/27/24  8:05 AM  Result Value Ref Range   Glucose, Bld 99 65 - 99 mg/dL   BUN 18 7 - 25 mg/dL   Creat 8.70 (H) 9.29 - 1.28 mg/dL   BUN/Creatinine Ratio  14 6 - 22 (calc)   Sodium 143 135 - 146 mmol/L   Potassium 3.9 3.5 - 5.3 mmol/L   Chloride 109 98 - 110 mmol/L   CO2 28 20 - 32 mmol/L   Calcium  9.1 8.6 - 10.3 mg/dL   Total Protein 6.2 6.1 - 8.1 g/dL   Albumin 3.8 3.6 - 5.1 g/dL   Globulin 2.4 1.9 - 3.7 g/dL (calc)   AG Ratio 1.6 1.0 - 2.5 (calc)   Total Bilirubin 0.8 0.2 - 1.2 mg/dL   Alkaline phosphatase (APISO) 77 35 - 144 U/L   AST 20 10 - 35 U/L   ALT 16 9 - 46 U/L      Assessment & Plan:   Problem List Items Addressed This Visit     Benign hypertension with CKD (chronic kidney disease), stage II   Benign localized hyperplasia of prostate with urinary obstruction   Central hypothyroidism   Chronic anticoagulation   Elevated PSA   Relevant Orders   PSA, total and free   Hyperlipidemia   Paroxysmal atrial fibrillation (HCC)   Thrombocytopenia (HCC)   Other Visit Diagnoses       Annual physical exam    -  Primary     Polyp of colon, unspecified part of colon, unspecified type       Relevant Orders   Ambulatory referral to Gastroenterology        Updated Health Maintenance information Reviewed recent lab results with patient Encouraged improvement to lifestyle with diet and exercise Goal of weight loss  Elevated prostate-specific antigen (PSA) History BPH Followed by [facility] Urology PSA increased from 4.5 to 8 over the past year. Prior PSA stable at 4 range for past 7 years Previous MRI Pelvis showed small prostate. Has followed with Urology for BPH  and PSA, previously assessed as low risk when reviewed data over past several years and prior imaging. - Order PSA test with free percentage. As per recommendations of his Urologist - Send results to urologist for review and further management.  Colonic polyp, status post removal, surveillance planned Last colonoscopy in 2022 showed one polyp. He prefers continued surveillance. - Refer to [facility] for colonoscopy, repeat in 3 years.  Chronic kidney disease stage  2 Kidney function consistent. No acute issues. Discussed hydration and avoiding nephrotoxic medications. - Advise on maintaining hydration and avoiding nephrotoxic medications.  Thrombocytopenia, asymptomatic Chronic low platelet count, no symptoms. Platelet count above concern threshold. - Monitor platelet levels.  Hyperlipidemia, on statin therapy LDL increased to 104, slightly above target. Possible missed doses of rosuvastatin . Discussed medication adherence. - Advise adherence to rosuvastatin  20mg  regimen. - Monitor LDL levels.  Adult Wellness Visit Routine annual wellness visit. Discussed health maintenance topics. - Update vaccine record with second shingles shot received on January 14, 2024. - Advise flu shot in October 2025. - Discuss COVID booster as optional, to be considered in the fall. - Discuss tetanus shot as low priority, last received in 2009. - Refer to [facility] for colonoscopy, repeat in 3 years.        Orders Placed This Encounter  Procedures   PSA, total and free   Ambulatory referral to Gastroenterology    Referral Priority:   Routine    Referral Type:   Consultation    Referral Reason:   Specialty Services Required    Number of Visits Requested:   1    No orders of the defined types were placed in this encounter.    Follow up plan: Return in about 1 year (around 08/02/2025) for 1 year fasting lab > 1 week later Annual Physical.  Future labs  Marsa Officer, DO [facility] 08/02/2024, 3:56 PM

## 2024-08-04 LAB — PSA, TOTAL AND FREE
PSA, % Free: 20 % — ABNORMAL LOW (ref >25)
PSA, Free: 1.3 ng/mL
PSA, Total: 6.4 ng/mL — ABNORMAL HIGH (ref <=4.0)

## 2024-08-08 ENCOUNTER — Ambulatory Visit: Payer: Self-pay | Admitting: Family Medicine

## 2024-08-08 ENCOUNTER — Other Ambulatory Visit: Payer: Self-pay | Admitting: Surgery

## 2024-08-14 ENCOUNTER — Encounter
Admission: RE | Admit: 2024-08-14 | Discharge: 2024-08-14 | Disposition: A | Discharge status: Home / Self Care | Source: Ambulatory Visit | Attending: Surgery | Admitting: Surgery

## 2024-08-14 ENCOUNTER — Other Ambulatory Visit: Payer: Self-pay

## 2024-08-14 HISTORY — DX: Paroxysmal atrial fibrillation: I48.0

## 2024-08-14 HISTORY — DX: Elevated prostate specific antigen (PSA): R97.20

## 2024-08-14 HISTORY — DX: Thrombocytopenia, unspecified: D69.6

## 2024-08-14 HISTORY — DX: Other specified hypothyroidism: E03.8

## 2024-08-14 HISTORY — DX: Long term (current) use of anticoagulants: Z79.01

## 2024-08-14 HISTORY — DX: Polyp of colon: K63.5

## 2024-08-14 HISTORY — DX: Cardiac arrhythmia, unspecified: I49.9

## 2024-08-14 NOTE — Patient Instructions (Addendum)
 Your procedure is scheduled on: Tuesday 08/22/24 Report to the Registration Desk on the 1st floor of the [facility]. To find out your arrival time, please call [phone] between 1PM - 3PM on: Friday 08/18/24 If your arrival time is 6:00 am, do not arrive before that time as the [facility] entrance doors do not open until 6:00 am.  REMEMBER: Instructions that are not followed completely may result in serious medical risk, up to and including death; or upon the discretion of your surgeon and anesthesiologist your surgery may need to be rescheduled.  Do not eat food after midnight the night before surgery.  No gum chewing or hard candies.  You may however, drink CLEAR liquids up to 2 hours before you are scheduled to arrive for your surgery. Do not drink anything within 2 hours of your scheduled arrival time.  Clear liquids include: - water  - apple juice without pulp - black coffee or tea (Do NOT add milk or creamers to the coffee or tea) Do NOT drink anything that is not on this list.   In addition, your doctor has ordered for you to drink the provided:  Ensure Pre-Surgery Clear Carbohydrate Drink  Drinking this carbohydrate drink up to two hours before surgery helps to reduce insulin resistance and improve patient outcomes. Please complete drinking 2 hours before scheduled arrival time.  One week prior to surgery: Stop Anti-inflammatories (NSAIDS) such as Advil, Aleve, Ibuprofen, Motrin, Naproxen, Naprosyn and Aspirin based products such as Excedrin, Goody's Powder, BC Powder. Stop ANY OVER THE COUNTER supplements until after surgery. Multiple Vitamins-Minerals (ADULT ONE DAILY GUMMIES PO)   You may however, continue to take Tylenol if needed for pain up until the day of surgery.  Stop apixaban  (ELIQUIS ) 5 MG 3 days prior to your surgery as instructed by your doctor (take last dose Friday 08/18/24). Stop tadalafil  (CIALIS ) 10 MG 2 days prior to your procedure (take last dose  Saturday 08/19/24)  Continue taking all of your other prescription medications up until the day of surgery.  ON THE DAY OF SURGERY ONLY TAKE THESE MEDICATIONS WITH SIPS OF WATER:  metoprolol  tartrate (LOPRESSOR ) 25 MG    No Alcohol for 24 hours before or after surgery.  No Smoking including e-cigarettes for 24 hours before surgery.  No chewable tobacco products for at least 6 hours before surgery.  No nicotine patches on the day of surgery.  Do not use any recreational drugs for at least a week (preferably 2 weeks) before your surgery.  Please be advised that the combination of cocaine and anesthesia may have negative outcomes, up to and including death. If you test positive for cocaine, your surgery will be cancelled.  On the morning of surgery brush your teeth with toothpaste and water, you may rinse your mouth with mouthwash if you wish. Do not swallow any toothpaste or mouthwash.  Use CHG Soap or wipes as directed on instruction sheet.  Do not wear jewelry, make-up, hairpins, clips or nail polish.  For welded (permanent) jewelry: bracelets, anklets, waist bands, etc.  Please have this removed prior to surgery.  If it is not removed, there is a chance that hospital personnel will need to cut it off on the day of surgery.  Do not wear lotions, powders, or perfumes.   Do not shave body hair from the neck down 48 hours before surgery.  Contact lenses, hearing aids and dentures may not be worn into surgery.  Do not bring valuables to the  hospital. [facility] is not responsible for any missing/lost belongings or valuables.   Notify your doctor if there is any change in your medical condition (cold, fever, infection).  Wear comfortable clothing (specific to your surgery type) to the hospital.  After surgery, you can help prevent lung complications by doing breathing exercises.  Take deep breaths and cough every 1-2 hours. Your doctor may order a device called an Incentive  Spirometer to help you take deep breaths. When coughing or sneezing, hold a pillow firmly against your incision with both hands. This is called "splinting." Doing this helps protect your incision. It also decreases belly discomfort.  If you are being admitted to the hospital overnight, leave your suitcase in the car. After surgery it may be brought to your room.  In case of increased patient census, it may be necessary for you, the patient, to continue your postoperative care in the Same Day Surgery department.  If you are being discharged the day of surgery, you will not be allowed to drive home. You will need a responsible individual to drive you home and stay with you for 24 hours after surgery.   If you are taking public transportation, you will need to have a responsible individual with you.  Please call the Pre-admissions Testing Dept. at [phone] if you have any questions about these instructions.  Surgery Visitation Policy:  Patients having surgery or a procedure may have two visitors.  Children under the age of 60 must have an adult with them who is not the patient.  Inpatient Visitation:    Visiting hours are 7 a.m. to 8 p.m. Up to four visitors are allowed at one time in a patient room. The visitors may rotate out with other people during the day.  One visitor age 26 or older may stay with the patient overnight and must be in the room by 8 p.m.   Merchandiser, retail to address health-related social needs:  [email or web address]                                                                                                             Preparing for Surgery with CHLORHEXIDINE GLUCONATE (CHG) Soap  Chlorhexidine Gluconate (CHG) Soap  o An antiseptic cleaner that kills germs and bonds with the skin to continue killing germs even after washing  o Used for showering the night before surgery and morning of surgery  Before surgery, you can play an  important role by reducing the number of germs on your skin.  CHG (Chlorhexidine gluconate) soap is an antiseptic cleanser which kills germs and bonds with the skin to continue killing germs even after washing.  Please do not use if you have an allergy to CHG or antibacterial soaps. If your skin becomes reddened/irritated stop using the CHG.  1. Shower the NIGHT BEFORE SURGERY and the MORNING OF SURGERY with CHG soap.  2. If you choose to wash your hair, wash your hair first as usual with your normal shampoo.  3. After shampooing, rinse your hair  and body thoroughly to remove the shampoo.  4. Use CHG as you would any other liquid soap. You can apply CHG directly to the skin and wash gently with a scrungie or a clean washcloth.  5. Apply the CHG soap to your body only from the neck down. Do not use on open wounds or open sores. Avoid contact with your eyes, ears, mouth, and genitals (private parts). Wash face and genitals (private parts) with your normal soap.  6. Wash thoroughly, paying special attention to the area where your surgery will be performed.  7. Thoroughly rinse your body with warm water.  8. Do not shower/wash with your normal soap after using and rinsing off the CHG soap.  9. Pat yourself dry with a clean towel.  10. Wear clean pajamas to bed the night before surgery.  12. Place clean sheets on your bed the night of your first shower and do not sleep with pets.  13. Shower again with the CHG soap on the day of surgery prior to arriving at the hospital.  14. Do not apply any deodorants/lotions/powders.  15. Please wear clean clothes to the hospital.

## 2024-08-15 ENCOUNTER — Inpatient Hospital Stay
Admission: RE | Admit: 2024-08-15 | Discharge: 2024-08-15 | Discharge status: Home / Self Care | Source: Ambulatory Visit | Attending: Surgery

## 2024-08-15 ENCOUNTER — Telehealth: Payer: Self-pay

## 2024-08-15 ENCOUNTER — Encounter: Admitting: Family Medicine

## 2024-08-15 DIAGNOSIS — R001 Bradycardia, unspecified: Secondary | ICD-10-CM | POA: Diagnosis not present

## 2024-08-15 DIAGNOSIS — I498 Other specified cardiac arrhythmias: Secondary | ICD-10-CM | POA: Insufficient documentation

## 2024-08-15 DIAGNOSIS — Z01810 Encounter for preprocedural cardiovascular examination: Secondary | ICD-10-CM | POA: Diagnosis present

## 2024-08-15 NOTE — Telephone Encounter (Signed)
   Pre-operative Risk Assessment    Patient Name: Andrew Benson  DOB: December 08, 1946 MRN: [id number]   Date of last office visit: 08/24/23 EVALENE LUNGER, MD Date of next office visit: NONE   Request for Surgical Clearance    Procedure:  ARTHROSCOPY, SHOULDER WITH DEBRIDEMENT; DECOMPRESSION, SUBACROMIAL SPACE; REPAIR, ROTATOR CUFF, OPEN; TENODESIS, BICEPS   Date of Surgery:  Clearance 08/22/24                                Surgeon:  Dr. Norleen Maltos, MD  Surgeon's Group or Practice Name:  [facility]  Phone number:  [phone]  Fax number:  [phone] (In efforts to conserve resources (paper), return FAX not required. I will follow up in [facility]).    Type of Clearance Requested:   - Medical  - Pharmacy:  Hold Apixaban  (Eliquis )     Type of Anesthesia:  General    Additional requests/questions:    Signed, Lucie DELENA Ku   08/15/2024, 1:11 PM        Elnor Dorise BRAVO, NP  P Cv Div Preop Callback Request for pre-operative cardiac clearance:   1. What type of surgery is being performed? ARTHROSCOPY, SHOULDER WITH DEBRIDEMENT; DECOMPRESSION, SUBACROMIAL SPACE; REPAIR, ROTATOR CUFF, OPEN; TENODESIS, BICEPS  2. When is this surgery scheduled? 08/22/2024   3. Type of clearance being requested (medical, pharmacy, both)? BOTH   4. Are there any medications that need to be held prior to surgery? APIXABAN   5. Practice name and name of physician performing surgery? Performing surgeon: Dr. Norleen Maltos, MD Requesting clearance: Dorise Elnor, FNP-C     6. Anesthesia type (none, local, MAC, general)? GENERAL  7. What is the office phone and fax number?   Phone: [phone] Fax: [phone] (In efforts to conserve resources (paper), return FAX not required. I will follow up in [facility]).  ATTENTION: Unable to create telephone message as per your standard workflow. Directed by [facility] providers to send requests for cardiac clearance to this pool for  appropriate distribution to provider covering pre-operative clearances.  Dorise Elnor, MSN, APRN, FNP-C, CEN [facility] Peri-operative Services Nurse Practitioner Phone: [phone] 08/12/24 1:58 PM

## 2024-08-15 NOTE — Telephone Encounter (Signed)
 Patient has been scheduled an televisit due to the timeframe and med hold scheduled him for tomorrow

## 2024-08-15 NOTE — Telephone Encounter (Signed)
 Primary Cardiologist:Timothy Perla, MD  Chart reviewed as part of pre-operative protocol coverage. Because of Andrew Benson past medical history and time since last visit, he/she will require a follow-up visit in order to better assess preoperative cardiovascular risk.  **Patient is due for office visit as of 08/24/2023. Would be willing to do virtual visit but must await recommendations for holding OAC from pharmacy team. Please discuss with patient if surgery is urgent and his preference in regards to scheduling office visit versus virtual visit.   Pre-op covering staff: - Please schedule appointment and call patient to inform them. - Please contact requesting surgeon's office via preferred method (i.e, phone, fax) to inform them of need for appointment prior to surgery.  This message will also be routed to pharmacy pool for input on holding anticoagulant agent as requested below so that this information is available at time of patient's appointment.   Rosaline EMERSON Bane, NP-C  08/15/2024, 2:36 PM [address], [address] [place], [place] [address] Office [phone] Fax [phone]

## 2024-08-15 NOTE — Telephone Encounter (Signed)
-----   Message from Andrew CHARLENA Benson sent at 08/12/2024  1:58 PM EDT ----- Regarding: Request for pre-operative cardiac clearance Request for pre-operative cardiac clearance:  1. What type of surgery is being performed?  ARTHROSCOPY, SHOULDER WITH DEBRIDEMENT; DECOMPRESSION, SUBACROMIAL SPACE; REPAIR, ROTATOR CUFF, OPEN; TENODESIS, BICEPS    2. When is this surgery scheduled?  08/22/2024  3. Type of clearance being requested (medical, pharmacy, both)? BOTH   4. Are there any medications that need to be held prior to surgery? APIXABAN   5. Practice name and name of physician performing surgery?  Performing surgeon: Dr. Norleen Maltos, MD Requesting clearance: Andrew Pereyra, FNP-C    6. Anesthesia type (none, local, MAC, general)? GENERAL  7. What is the office phone and fax number?   Phone: [phone] Fax: [phone] (In efforts to conserve resources (paper), return FAX not required. I will follow up in [facility]).   ATTENTION: Unable to create telephone message as per your standard workflow. Directed by [facility] providers to send requests for cardiac clearance to this pool for appropriate distribution to provider covering pre-operative clearances.   Andrew Pereyra, MSN, APRN, FNP-C, CEN [facility]  Peri-operative Services Nurse Practitioner Phone: [phone] 08/12/24 1:58 PM

## 2024-08-15 NOTE — Telephone Encounter (Signed)
 Patient has been scheduled for televisit and consent done

## 2024-08-15 NOTE — Telephone Encounter (Signed)
 Patient with diagnosis of afib on Eliquis  for anticoagulation.    Procedure: ARTHROSCOPY, SHOULDER WITH DEBRIDEMENT; DECOMPRESSION, SUBACROMIAL SPACE; REPAIR, ROTATOR CUFF, OPEN; TENODESIS, BICEPS  Date of procedure: 08/22/24   CHA2DS2-VASc Score = 3   This indicates a 3.2% annual risk of stroke. The patient's score is based upon: CHF History: 0 HTN History: 1 Diabetes History: 0 Stroke History: 0 Vascular Disease History: 0 Age Score: 2 Gender Score: 0     CrCl 55 ml/min Platelet count 133  Patient has not had an Afib/aflutter ablation within the last 3 months or DCCV within the last 30 days  Per office protocol, patient can hold Eliquis  for 3 days prior to procedure.    **This guidance is not considered finalized until pre-operative APP has relayed final recommendations.**

## 2024-08-16 ENCOUNTER — Ambulatory Visit: Attending: Cardiovascular Disease

## 2024-08-16 DIAGNOSIS — Z01810 Encounter for preprocedural cardiovascular examination: Secondary | ICD-10-CM | POA: Diagnosis not present

## 2024-08-16 NOTE — Progress Notes (Signed)
 Virtual Visit via Telephone Note   Because of Bobie Kistler co-morbid illnesses, he is at least at moderate risk for complications without adequate follow up.  This format is felt to be most appropriate for this patient at this time.  Due to technical limitations with video connection Web designer), today's appointment will be conducted as an audio only telehealth visit, and Cliff Damiani verbally agreed to proceed in this manner.   All issues noted in this document were discussed and addressed.  No physical exam could be performed with this format.  Evaluation Performed:  Preoperative cardiovascular risk assessment _____________   Date:  08/16/2024   Patient ID:  Andrew Benson, DOB 12/13/1946, MRN [id number] Patient Location:  Home Provider location:   Office  Primary Care Provider:  Edman Marsa PARAS, DO Primary Cardiologist:  Evalene Lunger, MD  Chief Complaint / Patient Profile   78 y.o. y/o male with a h/o atrial fibrillation, HLD, CKD stage II, HTN who is pending shoulder arthroscopy and decompression with rotator cuff repair and presents today for telephonic preoperative cardiovascular risk assessment.  History of Present Illness    Andrew Benson is a 78 y.o. male who presents via audio/video conferencing for a telehealth visit today.  Pt was last seen in cardiology clinic on 08/24/2023 by Dr. Gollan.  At that time Andrew Benson was doing well with low BP and recommended holding lisinopril  at that time. He endorses no new cardiac complaints during visit. The patient is now pending procedure as outlined above. Since his last visit, he has been doing well with no new cardiac complaints.  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  He reports that his blood pressures have improved since discontinuing lisinopril  and are now stable.  The most recent blood pressure from 08/02/2024 was 122/70.   He denies chest pain, shortness  of breath, lower extremity edema, fatigue, palpitations, melena, hematuria, hemoptysis, diaphoresis, weakness, presyncope, syncope, orthopnea, and PND.    Past Medical History    Past Medical History:  Diagnosis Date   Asthma    Central hypothyroidism    Chronic anticoagulation    CKD (chronic kidney disease), stage II    Diastolic dysfunction    a. 12/2022 Echo: EF 55-60%, no rwma, GrI DD, mild LVH, nl RV fxn, Ao sclerosis. Ao root 40mm.   Dilated aortic root (HCC)    a. 12/2022 Echo: Ao root 40mm.   Dysrhythmia    Elevated PSA    Essential hypertension    History of cardiovascular stress test    a. 12/2022 Ex MV: EF 34%, freq PVCs, no ST/T changes, HTN response (211/98), no isch/infarct. CT attenuation images w/o significant Ao or Cor atherosclerosis.   Hyperlipidemia    NSVT (nonsustained ventricular tachycardia) (HCC)    PAF (paroxysmal atrial fibrillation) (HCC)    a. CHA2DS2VASc = 3;  b. takes amio/eliquis /metoprolol  prn for PAF.   Palpitations    a. 11/2022 Zio: Predominantly sinus rhythm at 65 (40-255).  4 runs of NSVT, longest 8 beats (162), fastest 255 (6 beats). 8 SVT runs, longests 15 beats (127), fastest 143 5 beats). Rare PACs/PVCs.   Paroxysmal atrial fibrillation (HCC)    Polyp of colon, unspecified part of colon, unspecified type    PSVT (paroxysmal supraventricular tachycardia) (HCC)    Thrombocytopenia (HCC)    TMJ locking    Past Surgical History:  Procedure Laterality Date   COLONOSCOPY WITH PROPOFOL  N/A 12/29/2017   Procedure: COLONOSCOPY  WITH PROPOFOL ;  Surgeon: Viktoria Lamar DASEN, MD;  [facility] ENDOSCOPY;  Service: Endoscopy;  Laterality: N/A;   COLONOSCOPY WITH PROPOFOL  N/A 07/02/2021   Procedure: COLONOSCOPY WITH PROPOFOL ;  Surgeon: Toledo, Ladell POUR, MD;  Location: [facility] ENDOSCOPY;  Service: Gastroenterology;  Laterality: N/A;   EYE SURGERY Bilateral    FINGER SURGERY     left index finger   INGUINAL HERNIA REPAIR Right 2007   INGUINAL HERNIA  REPAIR Left 2008   done x 2   KNEE SURGERY     left    TRIGGER FINGER RELEASE Left     Allergies  Allergies  Allergen Reactions   Penicillins Rash    Home Medications    Prior to Admission medications   Medication Sig Start Date End Date Taking? Authorizing Provider  acetaminophen (TYLENOL) 500 MG tablet Take 500-1,000 mg by mouth every 6 (six) hours as needed (pain.).    [provider]  apixaban  (ELIQUIS ) 5 MG TABS tablet Take 1 tablet (5 mg total) by mouth 2 (two) times daily. 08/24/23   Gollan, Timothy J, MD  metoprolol  tartrate (LOPRESSOR ) 25 MG tablet Take 0.5 tablets (12.5 mg total) by mouth 2 (two) times daily. 09/06/23   Gollan, Timothy J, MD  Multiple Vitamins-Minerals (ADULT ONE DAILY GUMMIES PO) Take 2 each by mouth in the morning.    [provider]  rosuvastatin  (CRESTOR ) 20 MG tablet Take 1 tablet (20 mg total) by mouth daily. Patient taking differently: Take 20 mg by mouth every evening. 08/24/23   Gollan, Timothy J, MD  tadalafil  (CIALIS ) 10 MG tablet Take 10mg  daily, with an additional 10mg  dose as needed 1 hour prior to sexual activity Patient taking differently: Take 10 mg by mouth daily as needed for erectile dysfunction. 02/08/24   Francisca Redell BROCKS, MD  tamsulosin  (FLOMAX ) 0.4 MG CAPS capsule Take 0.4 mg by mouth daily in the afternoon.    [provider]    Physical Exam    Vital Signs:  Andrew Benson does not have vital signs available for review today.  Given telephonic nature of communication, physical exam is limited. AAOx3. NAD. Normal affect.  Speech and respirations are unlabored.  Accessory Clinical Findings    None  Assessment & Plan    1.  Preoperative Cardiovascular Risk Assessment: - Patient is RCRI score 0.9%  The patient affirms he has been doing well without any new cardiac symptoms. They are able to achieve 7 METS without cardiac limitations. Therefore, based on ACC/AHA guidelines, the patient would be at  acceptable risk for the planned procedure without further cardiovascular testing. The patient was advised that if he develops new symptoms prior to surgery to contact our office to arrange for a follow-up visit, and he verbalized understanding.   The patient was advised that if he develops new symptoms prior to surgery to contact our office to arrange for a follow-up visit, and he verbalized understanding.  Per office protocol, patient can hold Eliquis  for 3 days prior to procedure.    A copy of this note will be routed to requesting surgeon.  Time:   Today, I have spent 6 minutes with the patient with telehealth technology discussing medical history, symptoms, and management plan.     Wyn Raddle, Jackee Shove, NP  08/16/2024, 8:13 AM

## 2024-08-18 ENCOUNTER — Encounter: Payer: Self-pay | Admitting: Surgery

## 2024-08-18 NOTE — Progress Notes (Signed)
 Perioperative / Anesthesia Services  Pre-Admission Testing Clinical Review / Pre-Operative Anesthesia Consult  Date: 08/18/24  PATIENT DEMOGRAPHICS: Name: Andrew Benson DOB: [birth year] MRN:   [id number]  Note: Available PAT nursing documentation and vital signs have been reviewed. Clinical nursing staff has updated patient's PMH/PSHx, current medication list, and drug allergies/intolerances to ensure complete and comprehensive history available to assist care teams in MDM as it pertains to the aforementioned surgical procedure and anticipated anesthetic course. Extensive review of available clinical information personally performed. Sarben PMH and PSHx updated with any diagnoses/procedures that  may have been inadvertently omitted during his intake with the pre-admission testing department's nursing staff.  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 male who is submitted for pre-surgical anesthesia review and clearance prior to him  undergoing the above procedure. Patient is a Former Smoker (20 pack years; quit 01/1993). 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  Patient is followed by cardiology (Gollan, MD). He was last seen in the cardiology clinic on 08/24/2023; notes reviewed. At the time of his clinic visit, patient doing well overall from a cardiovascular perspective. Patient denied any chest pain, shortness of breath, PND, orthopnea, palpitations, significant peripheral edema, weakness, fatigue, vertiginous symptoms, or presyncope/syncope. Patient with a past medical history significant for cardiovascular diagnoses. Documented physical exam was grossly benign, providing no evidence of acute exacerbation and/or decompensation of the patient's known cardiovascular conditions. Of note, complete records regarding patient's cardiovascular history unavailable for review at time of consult. Patient has received care outside of [place]. Information gathered from patient report and from notes provided by his local care providers.   Long term cardiac event monitor study performed in 11/2022 revealed a predominant underlying normal sinus rhythm at an average rate of 65 bpm; range 40-255 bpm. There were 4 runs of NSVT captured with the longest lasting 6 beats at a maximum rate of 255 bpm and the longest lasting 8 beats at an average rate of 162 bpm. Additionally, there were 8 runs observed with the fastest lasting 15 beats and a maximum rate of 143 bpm and the longest lasting 15 beats with an average rate of 127 bpm. Rare atrial and ventricular ectopy noted. No sustained arrhythmias or prolonged pauses. No  patient triggered events.   Most recent myocardial perfusion imaging study was performed on 01/08/2023 revealing a moderately reduced left  ventricular systolic function with an EF of 34%.  There was mild global hypokinesis. Patient demonstrated a hypertensive response to stress; maximum BP 211/98 bpm. He was able to achieve 86% of his MPHR. No artifact or left ventricular cavity size enlargement appreciated on review of imaging. SPECT images demonstrated a small region of fixed apical thinning.  Frequent PVCs noted in stage I into II during stress, with resolution noted during stage II into III; rare PVCs noted in recovery. CT attenuation correction images with no significant aortic atherosclerosis or coronary calcifications. Study determined to be low risk. Echocardiogram recommended to confirm low EF.   Most recent TTE performed on 01/11/2023 revealed a normal left ventricular systolic function with an EF of 55-60%. There was mild LVH.  There were no regional wall motion abnormalities. Left ventricular diastolic Doppler parameters consistent with abnormal relaxation (G1DD). Right ventricular size and function normal with a TAPSE measuring 1.8 cm  (normal range >/= 1.6 cm). Mild aortic valve sclerosis was noted. There was no significant valvular regurgitation.  All transvalvular gradients were noted to be normal providing no evidence of hemodynamically significant valvular stenosis. Aortic root mildly dilated measuring up to 40 mm.   Patient with an atrial fibrillation diagnosis; CHA2DS2-VASc Score = 4 (age x 2, HTN, vascular disease). His rate and rhythm are currently being maintained on oral metoprolol  tartrate + amiodarone  as needed. He is chronically anticoagulated using apixaban ; reported to be compliant with therapy with no evidence or reports of GI/GU bleeding.  Blood pressure well controlled at 100/60 mmHg on prescribed ACEi (lisinopril ) monotherapy. He is on rosuvastatin  for his HLD diagnosis and further ASCVD prevention. In the setting of known cardiovascular diagnoses, it is important note that patient is on a PDE5i medication  (tadalafil ) for an erectile dysfunction diagnosis. Patient is not diabetic. He does not have an OSAH diagnosis. Patient is able to complete all of his  ADL/IADLs without cardiovascular limitation.  Per the DASI, patient is able to achieve at least 4 METS of physical activity without experiencing any significant degree of angina/anginal equivalent symptoms. No changes were made to his medication regimen during his visit with cardiology.  Given low blood pressures, patient was advised to hold his ACEi therapy and monitor. Patient scheduled to follow-up with outpatient cardiology in 12 months or sooner if needed.  Carlin Razor is scheduled for an elective ARTHROSCOPY, SHOULDER WITH DEBRIDEMENT (Left: Shoulder); DECOMPRESSION, SUBACROMIAL SPACE (Left: Shoulder); REPAIR, ROTATOR CUFF, OPEN (Left: Shoulder); TENODESIS, BICEPS (Left: Shoulder) on 08/22/2024 with Dr. Norleen JINNY Maltos, MD. Given patient's past medical history significant for cardiovascular diagnoses, presurgical cardiac clearance was sought by the PAT team.  Per cardiology, patient is RCRI score 0.9%. The patient affirms he has been doing well without any new cardiac symptoms. They are able to achieve 7 METS without cardiac limitations. Therefore, based on ACC/AHA guidelines, the patient would be at ACCEPTABLE risk for the planned procedure without further cardiovascular testing.  Again, this patient is on daily oral anticoagulation therapy using a DOAC medication.  He has been instructed on recommendations for holding his apixaban  for 3 days prior to his procedure with plans to restart as soon as postoperative bleeding risk felt to be minimized by his primary attending surgeon. The patient has been instructed that his last dose of should be on 08/18/2024.  Patient denies previous perioperative complications with anesthesia in the  past. In review his EMR, it is noted that patient underwent a general anesthetic course here at [facility] in 06/2021 without documented complications.   MOST RECENT VITAL SIGNS:    08/14/2024    4:36 PM 08/02/2024    3:30 PM 07/25/2024   10:56 AM  Vitals with BMI  Height 5' 11 5' 11 5' 11  Weight 178 lbs 178 lbs 2 oz 180 lbs 4 oz  BMI 24.84 24.85 25.15  Systolic  122 150  Diastolic  70 90  Pulse  65 49   PROVIDERS/SPECIALISTS: NOTE: Primary physician provider listed below. Patient may have been seen by APP or partner within same practice.   PROVIDER ROLE / SPECIALTY LAST OV  Poggi, Norleen PARAS, MD Orthopedics (Surgeon) 08/08/2024  Edman Marsa PARAS, DO Primary Care Provider 08/02/2024  Perla Lye, MD Cardiology 08/24/2023; preop APP call 08/16/2024   ALLERGIES: Allergies  Allergen Reactions   Penicillins Rash    CURRENT HOME MEDICATIONS: No current facility-administered medications for this encounter.    acetaminophen  (TYLENOL ) 500 MG tablet   apixaban  (ELIQUIS ) 5 MG TABS tablet   metoprolol  tartrate (LOPRESSOR ) 25 MG tablet   Multiple Vitamins-Minerals (ADULT ONE DAILY GUMMIES PO)   rosuvastatin  (CRESTOR ) 20 MG tablet   tadalafil  (CIALIS ) 10 MG tablet   tamsulosin  (FLOMAX ) 0.4 MG CAPS capsule   HISTORY: Past Medical History:  Diagnosis Date   Aortic atherosclerosis (HCC)    Asthma    BPH (benign prostatic hyperplasia)    CKD (chronic kidney disease), stage II    Diastolic dysfunction    a. 12/2022 Echo: EF 55-60%, no rwma, GrI DD, mild LVH, nl RV fxn, Ao sclerosis. Ao root 40mm.   Dilated aortic root (HCC)    a. 12/2022 Echo: Ao root 40mm.   Elevated PSA    Erectile dysfunction    a.) on PDE5i (tadalafil )   Essential hypertension    Former smoker    History of cardiovascular stress test    a. 12/2022 Ex MV: EF 34%, freq PVCs, no ST/T changes, HTN response (211/98), no isch/infarct. CT attenuation images w/o significant Ao or Cor atherosclerosis.   Hyperlipidemia    Hypothyroidism    Left rotator cuff tear     Musculoskeletal chest pain    NSVT (nonsustained ventricular tachycardia) (HCC)    On apixaban  therapy    PAF (paroxysmal atrial fibrillation) (HCC)    a.) CHA2DS2VASc = 4 (age x2, HTN, vascular disease) as of 08/18/2024; b.) rate/rhythm maintained on oral metoprolol  tartrate + amiodarone  as needed; chronically anticoagulated with apixaban    Palpitations    a. 11/2022 Zio: Predominantly sinus rhythm at 65 (40-255).  4 runs of NSVT, longest 8 beats (162), fastest 255 (6 beats). 8 SVT runs, longests 15 beats (127), fastest 143 5 beats). Rare PACs/PVCs.   Polyp of colon, unspecified part of colon, unspecified type    PSVT (paroxysmal supraventricular tachycardia) (HCC)    Status post bilateral cataract extraction    Thrombocytopenia (HCC)    TMJ locking    Past Surgical History:  Procedure Laterality Date   COLONOSCOPY WITH PROPOFOL  N/A 12/29/2017   Procedure: COLONOSCOPY WITH PROPOFOL ;  Surgeon: Viktoria Lamar DASEN, MD;  Location: [facility] ENDOSCOPY;  Service: Endoscopy;  Laterality: N/A;   COLONOSCOPY WITH PROPOFOL  N/A 07/02/2021   Procedure: COLONOSCOPY WITH PROPOFOL ;  Surgeon: Toledo, Ladell POUR, MD;  Location: [facility] ENDOSCOPY;  Service: Gastroenterology;  Laterality: N/A;   EYE SURGERY Bilateral  FINGER SURGERY     left index finger   INGUINAL HERNIA REPAIR Right 2007   INGUINAL HERNIA REPAIR Left 2008   done x 2   KNEE SURGERY     left    TRIGGER FINGER RELEASE Left    Family History  Problem Relation Age of Onset   Hyperlipidemia Mother    Hypertension Mother    Heart disease Mother    Heart disease Sister        valve repair    Diabetes Brother    Hypertension Brother    Social History   Tobacco Use   Smoking status: Former    Current packs/day: 0.00    Average packs/day: 1 pack/day for 20.0 years (20.0 ttl pk-yrs)    Types: Cigarettes    Start date: 02/01/1973    Quit date: 02/01/1993    Years since quitting: 31.5    Passive exposure: Past   Smokeless tobacco: Never   Substance Use Topics   Alcohol use: Not Currently    Comment: social drinker.   LABS:  Lab Results  Component Value Date   WBC 4.5 06/27/2024   HGB 14.7 06/27/2024   HCT 43.5 06/27/2024   MCV 93.8 06/27/2024   PLT 133 (L) 06/27/2024   Lab Results  Component Value Date   NA 143 06/27/2024   CL 109 06/27/2024   K 3.9 06/27/2024   CO2 28 06/27/2024   BUN 18 06/27/2024   CREATININE 1.29 (H) 06/27/2024   EGFR 63 06/22/2023   CALCIUM  9.1 06/27/2024   ALBUMIN 3.9 01/05/2023   GLUCOSE 99 06/27/2024    ECG: Date: 08/15/2024 Time ECG obtained: 1213 PM Rate: 49 bpm Rhythm: Sinus bradycardia with sinus arrhythmia Axis (leads I and aVF): normal Intervals: PR 134 ms. QRS 84 ms. QTc 373 ms. ST segment and T wave changes: No evidence of acute T wave abnormalities or significant ST segment elevation or depression.  Evidence of a possible, age undetermined, prior infarct:  No Comparison: Similar to previous tracing obtained on 08/24/2023   IMAGING / PROCEDURES: MR SHOULDER LEFT WO CONTRAST performed on 05/14/2024 Small partial-thickness midsubstance tear of the mid to anterior supraspinatus tendon footprint measuring up to 6 mm in transverse dimension, 5 mm in AP dimension, and 8 mm along the length of the tendon. Mild-to-moderate underlying supraspinatus tendinosis. No tendon retraction. Mild anterior superior subscapularis tendinosis. Moderate proximal long of the biceps tendinosis. Mild-to-moderate degenerative changes of the acromioclavicular joint. Mild subacromial/subdeltoid bursitis.  TRANSTHORACIC ECHOCARDIOGRAM performed on 01/11/2023 Left ventricular ejection fraction, by estimation, is 55 to 60%. The left ventricle has normal function. The left ventricle has no regional wall motion abnormalities. There is mild left ventricular hypertrophy. Left ventricular diastolic parameters are consistent with Grade I diastolic dysfunction (impaired relaxation).  Right ventricular  systolic function is normal. The right ventricular size is normal.  The mitral valve is normal in structure. No evidence of mitral valve regurgitation.  The aortic valve is tricuspid. Aortic valve regurgitation is not visualized. Aortic valve sclerosis is present, with no evidence of aortic valve stenosis. Aortic dilatation noted. There is mild dilatation of the aortic root, measuring 40 mm.   NM MYOCAR MULTI W/SPECT W/WALL MOTION / EF performed on 01/08/2023 Pharmacological myocardial perfusion imaging study with no significant  ischemia Small region fixed apical thinning noted Mild global hypokinesis, anterior wall motion best preserved, EF estimated at 34% No EKG changes concerning for ischemia at peak stress or in recovery (PVCs as detailed below) Frequent  PVCs noted on exercise in stage I into stage II. PVCs resolving stage II into stage III, rare PVCs in recovery Hypertensive with exercise peak blood pressure 211/98, Target heart rate achieved, 86% of maximum predicted CT attenuation correction images with no significant aortic atherosclerosis or coronary calcification Low risk scan, consider baseline echocardiogram to confirm ejection fraction   LONG TERM CARDIAC EVENT MONITOR STUDY performed on 12/22/2022 Patch Wear Time:  13 days and 17 hours (2023-12-11T19:13:43-0500 to 2023-1-25T12:40:06-499) Normal sinus rhythm Patient had a min HR of 40 bpm, max HR of 255 bpm, and avg HR of 65 bpm.  4 Ventricular Tachycardia runs occurred, the run with the fastest interval lasting 6 beats with a max rate of 255 bpm, the longest lasting 8 beats with an avg rate of 162 bpm.  8 Supraventricular Tachycardia runs occurred, the run with the fastest interval lasting 5 beats with a max rate of 143 bpm, the longest lasting 15 beats with an avg rate of 127 bpm. Isolated SVEs were rare (<1.0%), SVE Couplets were rare (<1.0%), and SVE Triplets were rare (<1.0%).  Isolated VEs were rare (<1.0%), VE Couplets  were rare (<1.0%), and no VE Triplets were present. Ventricular Bigeminy was present.  No patient triggered events recorded.   IMPRESSION AND PLAN: Rio Kidane has been referred for pre-anesthesia review and clearance prior to him undergoing the planned anesthetic and procedural courses. Available labs, pertinent testing, and imaging results were personally reviewed by me in preparation for upcoming operative/procedural course. [facility] medical record has been updated following extensive record review and patient interview with PAT staff.   This patient has been appropriately cleared by cardiology with an overall ACCEPTABLE risk of patient experiencing significant perioperative cardiovascular complications. Based on clinical review performed today (08/18/24), barring any significant acute changes in the patient's overall condition, it is anticipated that he will be able to proceed with the planned surgical intervention. Any acute changes in clinical condition may necessitate his procedure being postponed and/or cancelled. Patient will meet with anesthesia team (MD and/or CRNA) on the day of his procedure for preoperative evaluation/assessment. Questions regarding anesthetic course will be fielded at that time.   Pre-surgical instructions were reviewed with the patient during his PAT appointment, and questions were fielded to satisfaction by PAT clinical staff. He has been instructed on which medications that he will need to hold prior to surgery, as well as the ones that have been deemed safe/appropriate to take on the day of his procedure. As part of the general education provided by PAT, patient made aware both verbally and in writing, that he would need to abstain from the use of any illegal substances during his perioperative course. He was advised that failure to follow the provided instructions could necessitate case cancellation or result in serious perioperative complications up to and  including death. Patient encouraged to contact PAT and/or his surgeon's office to discuss any questions or concerns that may arise prior to surgery; verbalized understanding.   Dorise Pereyra, MSN, APRN, FNP-C, CEN [facility]  Perioperative Services Nurse Practitioner Phone: [phone] Fax: [phone] 08/18/24 10:02 AM  NOTE: This note has been prepared using Dragon dictation software. Despite my best ability to proofread, there is always the potential that unintentional transcriptional errors may still occur from this process.

## 2024-08-20 ENCOUNTER — Encounter: Payer: Self-pay | Admitting: Surgery

## 2024-08-22 ENCOUNTER — Ambulatory Visit: Payer: Self-pay | Admitting: Urgent Care

## 2024-08-22 ENCOUNTER — Ambulatory Visit

## 2024-08-22 ENCOUNTER — Other Ambulatory Visit: Payer: Self-pay

## 2024-08-22 ENCOUNTER — Ambulatory Visit
Admission: RE | Admit: 2024-08-22 | Discharge: 2024-08-22 | Disposition: A | Discharge status: Home / Self Care | Source: Ambulatory Visit | Attending: Surgery | Admitting: Surgery

## 2024-08-22 ENCOUNTER — Encounter
Admission: RE | Disposition: A | Discharge status: Home / Self Care | Payer: Self-pay | Source: Ambulatory Visit | Attending: Surgery

## 2024-08-22 ENCOUNTER — Encounter: Payer: Self-pay | Admitting: Surgery

## 2024-08-22 DIAGNOSIS — M19012 Primary osteoarthritis, left shoulder: Secondary | ICD-10-CM | POA: Insufficient documentation

## 2024-08-22 DIAGNOSIS — I700 Atherosclerosis of aorta: Secondary | ICD-10-CM | POA: Diagnosis not present

## 2024-08-22 DIAGNOSIS — M25812 Other specified joint disorders, left shoulder: Secondary | ICD-10-CM | POA: Insufficient documentation

## 2024-08-22 DIAGNOSIS — M65812 Other synovitis and tenosynovitis, left shoulder: Secondary | ICD-10-CM | POA: Diagnosis not present

## 2024-08-22 DIAGNOSIS — S46012A Strain of muscle(s) and tendon(s) of the rotator cuff of left shoulder, initial encounter: Secondary | ICD-10-CM | POA: Insufficient documentation

## 2024-08-22 DIAGNOSIS — M67912 Unspecified disorder of synovium and tendon, left shoulder: Secondary | ICD-10-CM | POA: Insufficient documentation

## 2024-08-22 DIAGNOSIS — Z87891 Personal history of nicotine dependence: Secondary | ICD-10-CM | POA: Diagnosis not present

## 2024-08-22 DIAGNOSIS — Z79899 Other long term (current) drug therapy: Secondary | ICD-10-CM | POA: Insufficient documentation

## 2024-08-22 DIAGNOSIS — J45909 Unspecified asthma, uncomplicated: Secondary | ICD-10-CM | POA: Insufficient documentation

## 2024-08-22 DIAGNOSIS — G8929 Other chronic pain: Secondary | ICD-10-CM | POA: Diagnosis present

## 2024-08-22 DIAGNOSIS — W19XXXA Unspecified fall, initial encounter: Secondary | ICD-10-CM | POA: Insufficient documentation

## 2024-08-22 DIAGNOSIS — E039 Hypothyroidism, unspecified: Secondary | ICD-10-CM | POA: Diagnosis not present

## 2024-08-22 DIAGNOSIS — N182 Chronic kidney disease, stage 2 (mild): Secondary | ICD-10-CM | POA: Diagnosis not present

## 2024-08-22 DIAGNOSIS — M25512 Pain in left shoulder: Secondary | ICD-10-CM | POA: Diagnosis present

## 2024-08-22 DIAGNOSIS — S46012S Strain of muscle(s) and tendon(s) of the rotator cuff of left shoulder, sequela: Secondary | ICD-10-CM | POA: Diagnosis present

## 2024-08-22 DIAGNOSIS — E785 Hyperlipidemia, unspecified: Secondary | ICD-10-CM | POA: Diagnosis not present

## 2024-08-22 DIAGNOSIS — M7522 Bicipital tendinitis, left shoulder: Secondary | ICD-10-CM | POA: Insufficient documentation

## 2024-08-22 DIAGNOSIS — N529 Male erectile dysfunction, unspecified: Secondary | ICD-10-CM | POA: Insufficient documentation

## 2024-08-22 DIAGNOSIS — Z7901 Long term (current) use of anticoagulants: Secondary | ICD-10-CM | POA: Insufficient documentation

## 2024-08-22 DIAGNOSIS — I480 Paroxysmal atrial fibrillation: Secondary | ICD-10-CM | POA: Diagnosis not present

## 2024-08-22 DIAGNOSIS — I1310 Hypertensive heart and chronic kidney disease without heart failure, with stage 1 through stage 4 chronic kidney disease, or unspecified chronic kidney disease: Secondary | ICD-10-CM | POA: Insufficient documentation

## 2024-08-22 HISTORY — DX: Atherosclerosis of aorta: I70.0

## 2024-08-22 HISTORY — DX: Hypothyroidism, unspecified: E03.9

## 2024-08-22 HISTORY — PX: SUBACROMIAL DECOMPRESSION: SHX5174

## 2024-08-22 HISTORY — PX: BICEPT TENODESIS: SHX5116

## 2024-08-22 HISTORY — DX: Personal history of nicotine dependence: Z87.891

## 2024-08-22 HISTORY — DX: Unspecified rotator cuff tear or rupture of left shoulder, not specified as traumatic: M75.102

## 2024-08-22 HISTORY — DX: Benign prostatic hyperplasia without lower urinary tract symptoms: N40.0

## 2024-08-22 HISTORY — DX: Other chest pain: R07.89

## 2024-08-22 HISTORY — DX: Cataract extraction status, right eye: Z98.41

## 2024-08-22 HISTORY — PX: POSTERIOR LUMBAR FUSION 2 WITH HARDWARE REMOVAL: SHX7297

## 2024-08-22 HISTORY — PX: SHOULDER OPEN ROTATOR CUFF REPAIR: SHX2407

## 2024-08-22 HISTORY — DX: Long term (current) use of anticoagulants: Z79.01

## 2024-08-22 HISTORY — DX: Male erectile dysfunction, unspecified: N52.9

## 2024-08-22 SURGERY — ARTHROSCOPY, SHOULDER WITH DEBRIDEMENT
Anesthesia: General | Site: Shoulder | Laterality: Left

## 2024-08-22 MED ORDER — VASOPRESSIN 20 UNIT/ML IV SOLN
INTRAVENOUS | Status: DC | PRN
  Administered 2024-08-22 (×2): 1 [IU] via INTRAVENOUS

## 2024-08-22 MED ORDER — LIDOCAINE HCL (CARDIAC) PF 100 MG/5ML IV SOSY
PREFILLED_SYRINGE | INTRAVENOUS | Status: DC | PRN
  Administered 2024-08-22: 100 mg via INTRAVENOUS

## 2024-08-22 MED ORDER — BUPIVACAINE LIPOSOME 1.3 % IJ SUSP
INTRAMUSCULAR | Status: AC
  Filled 2024-08-22: qty 20

## 2024-08-22 MED ORDER — CHLORHEXIDINE GLUCONATE 0.12 % MT SOLN
OROMUCOSAL | Status: AC
  Filled 2024-08-22: qty 15

## 2024-08-22 MED ORDER — FENTANYL CITRATE PF 50 MCG/ML IJ SOSY
PREFILLED_SYRINGE | INTRAMUSCULAR | Status: AC
  Filled 2024-08-22: qty 1

## 2024-08-22 MED ORDER — BUPIVACAINE LIPOSOME 1.3 % IJ SUSP
INTRAMUSCULAR | Status: DC | PRN
  Administered 2024-08-22: 20 mL via PERINEURAL

## 2024-08-22 MED ORDER — ROSUVASTATIN CALCIUM 20 MG PO TABS: 20.0000 mg | ORAL_TABLET | Freq: Every evening | ORAL | Status: DC

## 2024-08-22 MED ORDER — ROCURONIUM BROMIDE 10 MG/ML (PF) SYRINGE
PREFILLED_SYRINGE | INTRAVENOUS | Status: AC
  Filled 2024-08-22: qty 10

## 2024-08-22 MED ORDER — CEFAZOLIN SODIUM-DEXTROSE 2-4 GM/100ML-% IV SOLN
2.0000 g | INTRAVENOUS | Status: AC
  Administered 2024-08-22: 2 g via INTRAVENOUS

## 2024-08-22 MED ORDER — ONDANSETRON HCL 4 MG/2ML IJ SOLN: 4.0000 mg | Freq: Four times a day (QID) | INTRAMUSCULAR | Status: DC | PRN

## 2024-08-22 MED ORDER — MIDAZOLAM HCL 2 MG/2ML IJ SOLN
INTRAMUSCULAR | Status: AC
  Filled 2024-08-22: qty 2

## 2024-08-22 MED ORDER — KETOROLAC TROMETHAMINE 15 MG/ML IJ SOLN
INTRAMUSCULAR | Status: AC
  Filled 2024-08-22: qty 1

## 2024-08-22 MED ORDER — OXYCODONE HCL 5 MG PO TABS: 5.0000 mg | ORAL_TABLET | ORAL | Status: DC | PRN

## 2024-08-22 MED ORDER — LIDOCAINE HCL (PF) 1 % IJ SOLN
INTRAMUSCULAR | Status: AC
  Filled 2024-08-22: qty 5

## 2024-08-22 MED ORDER — FENTANYL CITRATE (PF) 100 MCG/2ML IJ SOLN: 25.0000 ug | INTRAMUSCULAR | Status: DC | PRN

## 2024-08-22 MED ORDER — RINGERS IRRIGATION IR SOLN
Status: DC | PRN
  Administered 2024-08-22: 2101 mL

## 2024-08-22 MED ORDER — ONDANSETRON HCL 4 MG/2ML IJ SOLN
INTRAMUSCULAR | Status: DC | PRN
  Administered 2024-08-22: 4 mg via INTRAVENOUS

## 2024-08-22 MED ORDER — ACETAMINOPHEN 10 MG/ML IV SOLN
INTRAVENOUS | Status: DC | PRN
  Administered 2024-08-22: 1000 mg via INTRAVENOUS

## 2024-08-22 MED ORDER — LIDOCAINE HCL (PF) 1 % IJ SOLN
INTRAMUSCULAR | Status: DC | PRN
  Administered 2024-08-22: 3 mL via SUBCUTANEOUS

## 2024-08-22 MED ORDER — DEXMEDETOMIDINE HCL IN NACL 200 MCG/50ML IV SOLN
INTRAVENOUS | Status: DC | PRN
  Administered 2024-08-22: 8 ug via INTRAVENOUS

## 2024-08-22 MED ORDER — LACTATED RINGERS IV SOLN: INTRAVENOUS | Status: DC

## 2024-08-22 MED ORDER — CEFAZOLIN SODIUM-DEXTROSE 2-4 GM/100ML-% IV SOLN
INTRAVENOUS | Status: AC
  Filled 2024-08-22: qty 100

## 2024-08-22 MED ORDER — DEXAMETHASONE SODIUM PHOSPHATE 10 MG/ML IJ SOLN
INTRAMUSCULAR | Status: DC | PRN
  Administered 2024-08-22: 10 mg via INTRAVENOUS

## 2024-08-22 MED ORDER — PHENYLEPHRINE 80 MCG/ML (10ML) SYRINGE FOR IV PUSH (FOR BLOOD PRESSURE SUPPORT)
PREFILLED_SYRINGE | INTRAVENOUS | Status: DC | PRN
  Administered 2024-08-22: 80 ug via INTRAVENOUS
  Administered 2024-08-22 (×6): 40 ug via INTRAVENOUS

## 2024-08-22 MED ORDER — MIDAZOLAM HCL 2 MG/2ML IJ SOLN
INTRAMUSCULAR | Status: DC | PRN
  Administered 2024-08-22 (×2): 1 mg via INTRAVENOUS

## 2024-08-22 MED ORDER — ACETAMINOPHEN 10 MG/ML IV SOLN
INTRAVENOUS | Status: AC
  Filled 2024-08-22: qty 100

## 2024-08-22 MED ORDER — ROCURONIUM BROMIDE 100 MG/10ML IV SOLN
INTRAVENOUS | Status: DC | PRN
  Administered 2024-08-22: 50 mg via INTRAVENOUS

## 2024-08-22 MED ORDER — ACETAMINOPHEN 325 MG PO TABS: 325.0000 mg | ORAL_TABLET | Freq: Four times a day (QID) | ORAL | Status: DC | PRN

## 2024-08-22 MED ORDER — BUPIVACAINE HCL (PF) 0.5 % IJ SOLN
INTRAMUSCULAR | Status: AC
Start: 2024-08-22 — End: 2024-08-22
  Filled 2024-08-22: qty 10

## 2024-08-22 MED ORDER — PHENYLEPHRINE HCL-NACL 20-0.9 MG/250ML-% IV SOLN
INTRAVENOUS | Status: DC | PRN
  Administered 2024-08-22: 10 ug/min via INTRAVENOUS

## 2024-08-22 MED ORDER — PROPOFOL 10 MG/ML IV BOLUS
INTRAVENOUS | Status: AC
  Filled 2024-08-22: qty 20

## 2024-08-22 MED ORDER — BUPIVACAINE HCL (PF) 0.5 % IJ SOLN
INTRAMUSCULAR | Status: DC | PRN
Start: 2024-08-22 — End: 2024-08-23
  Administered 2024-08-22: 10 mL via PERINEURAL

## 2024-08-22 MED ORDER — PHENYLEPHRINE HCL-NACL 20-0.9 MG/250ML-% IV SOLN
INTRAVENOUS | Status: AC
  Filled 2024-08-22: qty 250

## 2024-08-22 MED ORDER — FENTANYL CITRATE (PF) 100 MCG/2ML IJ SOLN
INTRAMUSCULAR | Status: AC
  Filled 2024-08-22: qty 2

## 2024-08-22 MED ORDER — SUGAMMADEX SODIUM 200 MG/2ML IV SOLN
INTRAVENOUS | Status: DC | PRN
  Administered 2024-08-22: 200 mg via INTRAVENOUS

## 2024-08-22 MED ORDER — DEXAMETHASONE SODIUM PHOSPHATE 10 MG/ML IJ SOLN
INTRAMUSCULAR | Status: AC
  Filled 2024-08-22: qty 1

## 2024-08-22 MED ORDER — FENTANYL CITRATE (PF) 100 MCG/2ML IJ SOLN
INTRAMUSCULAR | Status: DC | PRN
  Administered 2024-08-22: 50 ug via INTRAVENOUS

## 2024-08-22 MED ORDER — METOCLOPRAMIDE HCL 5 MG/ML IJ SOLN: 5.0000 mg | Freq: Three times a day (TID) | INTRAMUSCULAR | Status: DC | PRN

## 2024-08-22 MED ORDER — EPINEPHRINE PF 1 MG/ML IJ SOLN
INTRAMUSCULAR | Status: AC
  Filled 2024-08-22: qty 1

## 2024-08-22 MED ORDER — DROPERIDOL 2.5 MG/ML IJ SOLN: 0.6250 mg | Freq: Once | INTRAMUSCULAR | Status: DC | PRN

## 2024-08-22 MED ORDER — KETOROLAC TROMETHAMINE 15 MG/ML IJ SOLN
15.0000 mg | Freq: Once | INTRAMUSCULAR | Status: AC
  Administered 2024-08-22: 15 mg via INTRAVENOUS

## 2024-08-22 MED ORDER — OXYCODONE HCL 5 MG PO TABS: 5.0000 mg | ORAL_TABLET | ORAL | 0 refills | Status: DC | PRN

## 2024-08-22 MED ORDER — BUPIVACAINE-EPINEPHRINE (PF) 0.5% -1:200000 IJ SOLN
INTRAMUSCULAR | Status: DC | PRN
  Administered 2024-08-22: 30 mL

## 2024-08-22 MED ORDER — ONDANSETRON HCL 4 MG PO TABS: 4.0000 mg | ORAL_TABLET | Freq: Four times a day (QID) | ORAL | Status: DC | PRN

## 2024-08-22 MED ORDER — BUPIVACAINE-EPINEPHRINE (PF) 0.5% -1:200000 IJ SOLN
INTRAMUSCULAR | Status: AC
  Filled 2024-08-22: qty 30

## 2024-08-22 MED ORDER — SEVOFLURANE IN SOLN
RESPIRATORY_TRACT | Status: AC
Start: 2024-08-22 — End: 2024-08-22
  Filled 2024-08-22: qty 250

## 2024-08-22 MED ORDER — ORAL CARE MOUTH RINSE: 15.0000 mL | Freq: Once | OROMUCOSAL | Status: AC

## 2024-08-22 MED ORDER — METOCLOPRAMIDE HCL 10 MG PO TABS: 5.0000 mg | ORAL_TABLET | Freq: Three times a day (TID) | ORAL | Status: DC | PRN

## 2024-08-22 MED ORDER — SODIUM CHLORIDE 0.9 % IV SOLN: INTRAVENOUS | Status: DC

## 2024-08-22 MED ORDER — GLYCOPYRROLATE 0.2 MG/ML IJ SOLN
INTRAMUSCULAR | Status: DC | PRN
  Administered 2024-08-22: .2 mg via INTRAVENOUS

## 2024-08-22 MED ORDER — PROPOFOL 1000 MG/100ML IV EMUL
INTRAVENOUS | Status: AC
Start: 2024-08-22 — End: 2024-08-22
  Filled 2024-08-22: qty 100

## 2024-08-22 MED ORDER — PROPOFOL 10 MG/ML IV BOLUS
INTRAVENOUS | Status: DC | PRN
  Administered 2024-08-22: 100 mg via INTRAVENOUS

## 2024-08-22 MED ORDER — CHLORHEXIDINE GLUCONATE 0.12 % MT SOLN
15.0000 mL | Freq: Once | OROMUCOSAL | Status: AC
  Administered 2024-08-22: 15 mL via OROMUCOSAL

## 2024-08-22 MED ORDER — TADALAFIL 10 MG PO TABS: 10.0000 mg | ORAL_TABLET | Freq: Every day | ORAL | Status: AC | PRN

## 2024-08-22 SURGICAL SUPPLY — 37 items
ANCHOR DBL 2.6 SLF-PNCH FIBRTK (Anchor) IMPLANT
BIT DRILL JUGRKNT W/NDL BIT2.9 (DRILL) IMPLANT
BLADE FULL RADIUS 3.5 (BLADE) ×1 IMPLANT
BUR ACROMIONIZER 4.0 (BURR) ×1 IMPLANT
CHLORAPREP W/TINT 26 (MISCELLANEOUS) ×1 IMPLANT
COVER MAYO STAND STRL (DRAPES) ×1 IMPLANT
ELECTRODE REM PT RTRN 9FT ADLT (ELECTROSURGICAL) ×1 IMPLANT
GAUZE SPONGE 4X4 12PLY STRL (GAUZE/BANDAGES/DRESSINGS) ×1 IMPLANT
GAUZE XEROFORM 1X8 LF (GAUZE/BANDAGES/DRESSINGS) ×1 IMPLANT
GLOVE BIO SURGEON STRL SZ7.5 (GLOVE) ×2 IMPLANT
GLOVE BIO SURGEON STRL SZ8 (GLOVE) ×2 IMPLANT
GLOVE BIOGEL PI IND STRL 8 (GLOVE) ×1 IMPLANT
GLOVE INDICATOR 8.0 STRL GRN (GLOVE) ×1 IMPLANT
GLOVE SURG SYN 6.0 PF PI (GLOVE) IMPLANT
GOWN STRL REUS W/ TWL LRG LVL3 (GOWN DISPOSABLE) ×1 IMPLANT
GOWN STRL REUS W/ TWL XL LVL3 (GOWN DISPOSABLE) ×1 IMPLANT
IV LR IRRIG 3000ML ARTHROMATIC (IV SOLUTION) ×1 IMPLANT
KIT CANNULA 8X76-LX IN CANNULA (CANNULA) ×1 IMPLANT
KIT KNEE FIBERTAK DISP (KITS) IMPLANT
MANIFOLD NEPTUNE II (INSTRUMENTS) ×2 IMPLANT
MASK FACE SPIDER DISP (MASK) ×1 IMPLANT
MAT ABSORB FLUID 56X50 GRAY (MISCELLANEOUS) ×1 IMPLANT
PACK ARTHROSCOPY SHOULDER (MISCELLANEOUS) ×1 IMPLANT
PAD ABD DERMACEA PRESS 5X9 (GAUZE/BANDAGES/DRESSINGS) ×2 IMPLANT
PENCIL SMOKE EVACUATOR (MISCELLANEOUS) IMPLANT
SLING ULTRA II LG (MISCELLANEOUS) ×1 IMPLANT
SLING ULTRA II M (MISCELLANEOUS) IMPLANT
SPONGE T-LAP 18X18 ~~LOC~~+RFID (SPONGE) ×1 IMPLANT
STAPLER SKIN PROX 35W (STAPLE) ×1 IMPLANT
STRAP SAFETY 5IN WIDE (MISCELLANEOUS) ×1 IMPLANT
SUT ETHIBOND 0 MO6 C/R (SUTURE) ×1 IMPLANT
SUT VIC AB 2-0 CT1 TAPERPNT 27 (SUTURE) ×2 IMPLANT
TAPE MICROFOAM 4IN (TAPE) ×1 IMPLANT
TRAP FLUID SMOKE EVACUATOR (MISCELLANEOUS) ×1 IMPLANT
TUBE SET DOUBLEFLO INFLOW (TUBING) ×1 IMPLANT
WAND WEREWOLF FLOW 90D (MISCELLANEOUS) ×1 IMPLANT
WATER STERILE IRR 500ML POUR (IV SOLUTION) ×1 IMPLANT

## 2024-08-22 NOTE — Op Note (Signed)
 08/22/2024  1:44 PM  Patient:   Andrew Benson  Pre-Op Diagnosis:   Impingement/tendinopathy with partial-thickness rotator cuff tear and biceps tendinopathy, left shoulder.  Post-Op Diagnosis:   Impingement/tendinopathy with bursal sided rotator cuff scuffing, labral fraying, and biceps tendinopathy, left shoulder.  Procedure:   Limited arthroscopic debridement, arthroscopic subacromial decompression, and mini-open biceps tenodesis, left shoulder.  Anesthesia:   General endotracheal with interscalene block placed preoperatively by the anesthesiologist.  Surgeon:   DOROTHA Reyes Maltos, MD  Assistant:   Gustavo Level, PA-C  Findings:   As above. There was moderate bursal sided scuffing involving the anterior middle portions of the supraspinatus tendon as well as along the rotator interval involving less than 10-15% of the thickness of the tendon. The remainder of the rotator cuff was in excellent condition. The biceps tendon demonstrated a longitudinal split tear. The articular surface the humerus was in excellent condition and while the glenoid demonstrated moderate focal thinning of the central articular surface.  Complications:   None  Estimated blood loss:   15 cc  Fluids:   400 cc  Tourniquet time:   None  Drains:   None  Closure:   Staples      Brief clinical note:   The patient is a 78 year old male with a long history of progressively worsening left shoulder pain. The patient's symptoms have progressed despite medications, activity modification, etc. The patient's history and examination are consistent with impingement/tendinopathy with a rotator cuff tear. These findings were confirmed by MRI scan. The patient presents at this time for definitive management of these shoulder symptoms.  Procedure:   The patient underwent placement of an interscalene block by the anesthesiologist in the preoperative holding area before being brought into the operating room and lain in the supine  position. The patient then underwent general endotracheal intubation and anesthesia before being repositioned in the beach chair position using the beach chair positioner. The left shoulder and upper extremity were prepped with ChloraPrep solution before being draped sterilely. Preoperative antibiotics were administered. A timeout was performed to confirm the proper surgical site before the expected portal sites and incision site were injected with 0.5% Sensorcaine  with epinephrine .   A posterior portal was created and the glenohumeral joint thoroughly inspected with the findings as described above. An anterior portal was created using an outside-in technique. The labrum and rotator cuff were further probed, again confirming the above-noted findings. Areas of labral fraying were debrided back to stable margins using the full-radius resector as were areas of synovitis. The ArthroCare wand was inserted and used to release the biceps tendon from his labral anchor.  Also was used to obtain hemostasis as well as to anneal the labrum superiorly and anteriorly. The instruments were removed from the joint after suctioning the excess fluid.  The camera was repositioned through the posterior portal into the subacromial space. A separate lateral portal was created using an outside-in technique. The 3.5 mm full-radius resector was introduced and used to perform a subtotal bursectomy. The ArthroCare wand was then inserted and used to remove the periosteal tissue off the undersurface of the anterior third of the acromion as well as to recess the coracoacromial ligament from its attachment along the anterior and lateral margins of the acromion. The 4.0 mm acromionizing bur was introduced and used to complete the decompression by removing the undersurface of the anterior third of the acromion. The full radius resector was reintroduced to remove any residual bony debris before the ArthroCare wand was reintroduced  to obtain  hemostasis. The instruments were then removed from the subacromial space after suctioning the excess fluid.  An approximately 4-5 cm incision was made over the anterolateral aspect of the shoulder beginning at the anterolateral corner of the acromion and extending distally in line with the bicipital groove. This incision was carried down through the subcutaneous tissues to expose the deltoid fascia. The raphae between the anterior and middle thirds was identified and this plane developed to provide access into the subacromial space. Additional bursal tissues were debrided sharply using Metzenbaum scissors. The rotator cuff tear was carefully inspected with the findings as described above. The fraying was deemed to be sufficiently superficial that formal repair or patching was not felt to be necessary.   The bicipital groove was identified by palpation and opened for 1-1.5 cm. The biceps tendon stump was retrieved through this defect. The floor of the bicipital groove was roughened with a curet. Two Arthrex double loaded Knotless Knee FiberTak anchors were inserted approximately 1.5 cm apart along the bicipital groove after drilling the appropriate holes. These anchors were set before the biceps tendon was passed from distal to proximal through each set of suture loops. With the appropriate tension maintained on the biceps tendon, first the proximal and then the distal loops were tightened securely to effect the tenodesis. One limb of the more proximal suture loop was passed through the tendon using a free needle to act as a ripstop before tying it together with the other suture loop suture.   The wound was copiously irrigated with sterile saline solution before the deltoid raphae was reapproximated using 2-0 Vicryl interrupted sutures. The subcutaneous tissues were closed in two layers using 2-0 Vicryl interrupted sutures before the skin was closed using staples. The portal sites also were closed using  staples. A sterile bulky dressing was applied to the shoulder before the arm was placed into a shoulder immobilizer. The patient was then awakened, extubated, and returned to the recovery room in satisfactory condition after tolerating the procedure well.

## 2024-08-22 NOTE — Discharge Instructions (Addendum)
 Orthopedic discharge instructions: Keep dressing dry and intact.  May shower after dressing changed on post-op day #4 (Saturday).  Cover staples with Band-Aids after drying off. Apply ice frequently to shoulder. Take oxycodone  as prescribed when needed.  May supplement with ES Tylenol  if necessary. Resume Eliquis  as prescribed beginning tomorrow morning. Keep shoulder immobilizer on at all times except may remove for bathing purposes. Follow-up in 10-14 days or as scheduled.

## 2024-08-22 NOTE — Anesthesia Procedure Notes (Signed)
 Procedure Name: Intubation Date/Time: 08/22/2024 12:31 PM  Performed by: Mathew Bernardino RAMAN, RNPre-anesthesia Checklist: Patient identified, Emergency Drugs available, Suction available and Patient being monitored Patient Re-evaluated:Patient Re-evaluated prior to induction Oxygen Delivery Method: Circle system utilized Preoxygenation: Pre-oxygenation with 100% oxygen Induction Type: IV induction Ventilation: Mask ventilation without difficulty Laryngoscope Size: McGrath and 4 Grade View: Grade I Tube type: Oral Tube size: 7.5 mm Number of attempts: 1 Airway Equipment and Method: Stylet Placement Confirmation: ETT inserted through vocal cords under direct vision, positive ETCO2 and breath sounds checked- equal and bilateral Secured at: 24 cm Tube secured with: Tape Dental Injury: Teeth and Oropharynx as per pre-operative assessment

## 2024-08-22 NOTE — Anesthesia Procedure Notes (Signed)
 Anesthesia Regional Block: Interscalene brachial plexus block   Pre-Anesthetic Checklist: , timeout performed,  Correct Patient, Correct Site, Correct Laterality,  Correct Procedure, Correct Position, site marked,  Risks and benefits discussed,  Surgical consent,  Pre-op evaluation,  At surgeon's request and post-op pain management  Laterality: Left  Prep: chloraprep       Needles:  Injection technique: Single-shot  Needle Type: Stimiplex     Needle Length: 5cm  Needle Gauge: 22     Additional Needles:   Procedures:,,,, ultrasound used (permanent image in chart),,    Narrative:  Start time: 08/22/2024 12:01 PM End time: 08/22/2024 12:03 PM Injection made incrementally with aspirations every 5 mL.  Performed by: Personally  Anesthesiologist: Dario Barter, MD  Additional Notes: Functioning IV was confirmed and monitors were applied.  A 50mm 22ga Stimuplex needle was used. Sterile prep and drape,hand hygiene and sterile gloves were used.  Negative aspiration and negative test dose prior to incremental administration of local anesthetic. The patient tolerated the procedure well.

## 2024-08-22 NOTE — H&P (Signed)
 History of Present Illness:  Andrew Benson is a 78 y.o. male who has severe Left shoulder pain. Patient reports a longstanding history of left shoulder pain after he slipped and fell onto his left side. Patient has been undergoing regular workup with both myself and Gustavo Level for this issue. He has continued to experience discomfort in this left shoulder ever since this fall and states that this pain localizes primarily along the anterior and lateral aspect of his shoulder. He states it is made worse with any attempted raising or above head motion. He does not have any clicking or locking symptoms of his left shoulder. He is right-hand dominant. Previous MRI shows tearing along the supraspinatus footprint with mild to moderate tendinosis of the supraspinatus, subscapularis and biceps tendon. He has failed conservative treatment including NSAID's, injections, PT, and activity modification. 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 He does report a remote history of cardiac issues where he had experienced paroxysmal atrial fibrillation which has since resolved. He denies having any pulmonary issues. No previous DVTs or clots. He is not a diabetic. Denies any previous surgeries on this left shoulder.  Social Hx: Patient lives at home with his wife who will be helping look after him postsurgically. 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 He denies any alcohol, illicit drug or nicotine use. No smoking.  Allergies: Penicillins (Rash)  Medicines: ELIQUIS  5 mg tablet Take 5 mg by mouth 2 (two) times daily  metoprolol  TARTrate (LOPRESSOR ) 25 MG tablet Take 12.5 mg by mouth 2 (two) times daily  multivitamin tablet Take 1 tablet by mouth once daily.  rosuvastatin  (CRESTOR ) 20 MG tablet Take 20 mg by mouth once daily  tadalafiL  (CIALIS ) 10 MG tablet Take 10 mg by mouth once daily with an additional 10mg  dose as needed 1 hour prior to sexual activity  tamsulosin   (FLOMAX ) 0.4 mg capsule Take 0.4 mg by mouth once daily  AMIOdarone  (PACERONE ) 200 MG tablet Take by mouth as needed.  (Patient not taking: Reported on 05/09/2024)  AMIOdarone  (PACERONE ) 200 MG tablet Take 200 mg by mouth once daily as needed ((TAKE FOR A FIB).)  apixaban  5 mg (74 tabs) DsPk Take by mouth as needed.  (Patient not taking: Reported on 08/08/2024)  aspirin 81 MG EC tablet Take 81 mg by mouth once daily. (Patient not taking: Reported on 04/15/2021)  baclofen  (LIORESAL ) 10 MG tablet Take by mouth. (Patient not taking: Reported on 04/15/2021)  coenzyme Q10 10 mg capsule Take 10 mg by mouth once daily (Patient not taking: Reported on 08/08/2024)  erythromycin  (ROMYCIN) ophthalmic ointment Place 1 Application into the right eye at bedtime. (Patient not taking: Reported on 08/08/2024)  lisinopril  (PRINIVIL ,ZESTRIL ) 5 MG tablet Take 5 mg by mouth once daily. (Patient not taking: Reported on 08/08/2024)  lisinopriL  (ZESTRIL ) 2.5 MG tablet Take 2.5 mg by mouth once daily as needed for pressure >160 (Patient not taking: Reported on 08/08/2024)  metoprolol  tartrate (LOPRESSOR ) 25 MG tablet Take by mouth as needed.  (Patient not taking: Reported on 08/08/2024)  peg-electrolyte (NULYTELY) solution Take 4,000 mLs by mouth as directed. Take as directed for colon prep. (Patient not taking: Reported on 08/08/2024) 4000 mL 0  rosuvastatin  (CRESTOR ) 20 MG tablet Take by mouth. (Patient not taking: Reported on 08/08/2024)  sodium, potassium, and magnesium (SUPREP) oral solution Take 1 Bottle by mouth as directed One kit contains 2 bottles. Take both bottles at the times instructed by your provider. (Patient  not taking: Reported on 03/24/2024) 354 mL 0   Past Medical History:  Hyperlipidemia  Hypertension   Past Surgical History:  COLONOSCOPY 12/29/2017 (Adenomatous Polyps: CBF 12/2020)  COLONOSCOPY 07/02/2021 (Tubular adenoma/PHx CP/Repeat 32yrs/TKT)  COLONOSCOPY 11/16/2006, 03/15/2002 (Adenomatous Polyp)   COLONOSCOPY 10/06/2012, 12/11/2009 (PH Adenomatous Polyps: CBF 09/2017)  HERNIA REPAIR  INCISION TENDON SHEATH FOR TRIGGER FINGER Left  KNEE ARTHROSCOPY  LEFT INDEX FINGER AMPUTATION Left  REPAIR INGUINAL HERNIA x3   Physical Exam:  Ht:180.3 cm (5' 11) Wt:81.2 kg (179 lb) BMI: Body mass index is 24.97 kg/m.  General/Constitutional: No apparent distress: well-nourished and well developed. Eyes: Pupils equal, round with synchronous movement. Lymphatic: No palpable adenopathy. Respiratory: Patient has good chest rise and fall with inspiration and expiration. All lung fields are clear to auscultation bilaterally. There is no Rales, rhonchi or wheezes appreciated. Cardiovascular: Upon auscultation there is a regular rate and rhythm without any murmurs, rubs, gallops or heaves appreciated. There does not appear to be any swelling down the lower extremities. Posterior tibial pulses appreciated bilaterally, 2+. Integumentary: No impressive skin lesions present, except as noted in detailed exam. Neuro/Psych: Normal mood and affect, oriented to person, place and time. Musculoskeletal: see exam below  Left Shoulder: Upon inspection of the patient's left shoulder there is no noticeable swelling, erythema, ecchymosis or gross deformity appreciated. No tenting of the skin noted. Shoulder contour: Normal Tenderness: Subacromial tenderness. Impingement test: negative Apprehension sign: negative Crepitance: negative Atrophy: No atrophy. Fair to limited strength with internal rotation and fair with external rotation of the shoulder against resistance. Liftoff test: Performed with difficulty Empty can test: Elicit some discomfort and has 4+/5 strength Speed's Test: Elicits some discomfort and notable weakness Range of Motion:  EXT/FLEX: -/160  ADD/ABD: -/130 actively with 150 passive with some increased discomfort  IR/ER at 90 degrees abduction: 80/90   Patient is neurovascularly intact all  dermatomes extending down their left upper extremity and into their hand and fingers. Radial pulses appreciated, 2+. Cap refill less than 2 seconds in each finger.  Imaging: A series of x-rays were ordered and interpreted the patient's left shoulder. These images included AP, Grashey and Y view. Upon inspection of Y view, there is no acute sign of dislocation. Upon inspection of Grashey view, there does appear to be some mild degenerative changes at the glenohumeral joint with some underlying subchondral changes of the glenoid. AC joint shows mild narrowing as well.. Humeral head appears to be slightly higher riding in nature. No fractures, lytic lesions or gross deformities appreciated on films.   MRI OF THE LEFT SHOULDER WITHOUT CONTRAST:  1. Small partial-thickness midsubstance tear of the mid to anterior  supraspinatus tendon footprint measuring up to 6 mm in transverse  dimension, 5 mm in AP dimension, and 8 mm along the length of the  tendon. Mild-to-moderate underlying supraspinatus tendinosis. No  tendon retraction.  2. Mild anterior superior subscapularis tendinosis.  3. Moderate proximal long of the biceps tendinosis.  4. Mild-to-moderate degenerative changes of the acromioclavicular  joint.  5. Mild subacromial/subdeltoid bursitis.   Assessment:  1. Traumatic incomplete tearing of left rotator cuff. 2. Biceps tendinitis of left shoulder. 3. Left acromioclavicular joint arthritis.  Plan: The treatment options, including both surgical and nonsurgical choices, have been discussed in detail with the patient.  The patient would like to proceed with surgical intervention to include a left shoulder arthroscopy with debridement, decompression, rotator cuff repair, and probable biceps tenodesis.  The risks (including bleeding, infection, nerve and/or  blood vessel injury, persistent or recurrent pain, failure of the repair, recurrence of the tear, stiffness of the shoulder, need for further  surgery, blood clots, strokes, heart attacks or arrhythmias, pneumonia, etc.) and benefits of the surgical procedure were discussed.  The patient states his understanding and agrees to proceed.  A formal written consent will be obtained by the nursing staff.    H&P reviewed and patient re-examined. No changes.

## 2024-08-22 NOTE — Anesthesia Preprocedure Evaluation (Addendum)
 Anesthesia Evaluation  Patient identified by MRN, date of birth, ID band Patient awake    Reviewed: Allergy & Precautions, NPO status , Patient's Chart, lab work & pertinent test results, reviewed documented beta blocker date and time   History of Anesthesia Complications Negative for: history of anesthetic complications  Airway Mallampati: II  TM Distance: >3 FB     Dental  (+) Teeth Intact, Dental Advidsory Given, Caps   Pulmonary neg shortness of breath, asthma , neg sleep apnea, neg COPD, neg recent URI, former smoker   Pulmonary exam normal        Cardiovascular hypertension, Pt. on medications and Pt. on home beta blockers (-) angina (-) Past MI and (-) Cardiac Stents Normal cardiovascular exam+ dysrhythmias Atrial Fibrillation (-) Valvular Problems/Murmurs     Neuro/Psych negative neurological ROS  negative psych ROS   GI/Hepatic negative GI ROS, Neg liver ROS,,,  Endo/Other  neg diabetesHypothyroidism    Renal/GU Renal InsufficiencyRenal disease  negative genitourinary   Musculoskeletal negative musculoskeletal ROS (+)    Abdominal Normal abdominal exam  (+)   Peds negative pediatric ROS (+)  Hematology negative hematology ROS (+)   Anesthesia Other Findings Past Medical History: No date: Aortic atherosclerosis (HCC) No date: Asthma No date: BPH (benign prostatic hyperplasia) No date: CKD (chronic kidney disease), stage II No date: Diastolic dysfunction     Comment:  a. 12/2022 Echo: EF 55-60%, no rwma, GrI DD, mild LVH, nl              RV fxn, Ao sclerosis. Ao root 40mm. No date: Dilated aortic root (HCC)     Comment:  a. 12/2022 Echo: Ao root 40mm. No date: Elevated PSA No date: Erectile dysfunction     Comment:  a.) on PDE5i (tadalafil ) No date: Essential hypertension No date: Former smoker No date: History of cardiovascular stress test     Comment:  a. 12/2022 Ex MV: EF 34%, freq PVCs, no ST/T  changes, HTN              response (211/98), no isch/infarct. CT attenuation images              w/o significant Ao or Cor atherosclerosis. No date: Hyperlipidemia No date: Hypothyroidism No date: Left rotator cuff tear No date: Musculoskeletal chest pain No date: NSVT (nonsustained ventricular tachycardia) (HCC) No date: On apixaban  therapy No date: PAF (paroxysmal atrial fibrillation) (HCC)     Comment:  a.) CHA2DS2VASc = 4 (age x2, HTN, vascular disease) as               of 08/18/2024; b.) rate/rhythm maintained on oral               metoprolol  tartrate + amiodarone  as needed; chronically               anticoagulated with apixaban  No date: Palpitations     Comment:  a. 11/2022 Zio: Predominantly sinus rhythm at 65               (40-255).  4 runs of NSVT, longest 8 beats (162), fastest              255 (6 beats). 8 SVT runs, longests 15 beats (127),               fastest 143 5 beats). Rare PACs/PVCs. No date: Polyp of colon, unspecified part of colon, unspecified type No date: PSVT (paroxysmal supraventricular tachycardia) (HCC) No date: Status post bilateral cataract extraction  No date: Thrombocytopenia (HCC) No date: TMJ locking   Reproductive/Obstetrics                              Anesthesia Physical Anesthesia Plan  ASA: 2  Anesthesia Plan: General   Post-op Pain Management: Regional block*   Induction: Intravenous  PONV Risk Score and Plan: 2 and Ondansetron , Dexamethasone  and Treatment may vary due to age or medical condition  Airway Management Planned: Oral ETT  Additional Equipment:   Intra-op Plan:   Post-operative Plan: Extubation in OR  Informed Consent: I have reviewed the patients History and Physical, chart, labs and discussed the procedure including the risks, benefits and alternatives for the proposed anesthesia with the patient or authorized representative who has indicated his/her understanding and acceptance.      Dental advisory given  Plan Discussed with: CRNA and Surgeon  Anesthesia Plan Comments:          Anesthesia Quick Evaluation

## 2024-08-23 ENCOUNTER — Encounter: Payer: Self-pay | Admitting: Surgery

## 2024-08-23 NOTE — Anesthesia Postprocedure Evaluation (Signed)
 Anesthesia Post Note  Patient: Carlin Razor  Procedure(s) Performed: ARTHROSCOPY, SHOULDER WITH DEBRIDEMENT (Left: Shoulder) DECOMPRESSION, SUBACROMIAL SPACE (Left: Shoulder) REPAIR, ROTATOR CUFF, OPEN (Left: Shoulder) TENODESIS, BICEPS (Left: Shoulder)  Patient location during evaluation: PACU Anesthesia Type: General Level of consciousness: awake and alert Pain management: pain level controlled Vital Signs Assessment: post-procedure vital signs reviewed and stable Respiratory status: spontaneous breathing, nonlabored ventilation, respiratory function stable and patient connected to nasal cannula oxygen Cardiovascular status: blood pressure returned to baseline and stable Postop Assessment: no apparent nausea or vomiting Anesthetic complications: no   No notable events documented.   Last Vitals:  Vitals:   08/22/24 1515 08/22/24 1538  BP: (!) 147/93 (!) 142/70  Pulse: 74 72  Resp: 20 15  Temp:  (!) 36.3 C  SpO2: 99% 100%    Last Pain:  Vitals:   08/22/24 1538  TempSrc: Temporal  PainSc: 0-No pain                 Prentice Murphy

## 2024-08-23 NOTE — Transfer of Care (Signed)
 Immediate Anesthesia Transfer of Care Note  Patient: Andrew Benson  Procedure(s) Performed: ARTHROSCOPY, SHOULDER WITH DEBRIDEMENT (Left: Shoulder) DECOMPRESSION, SUBACROMIAL SPACE (Left: Shoulder) REPAIR, ROTATOR CUFF, OPEN (Left: Shoulder) TENODESIS, BICEPS (Left: Shoulder)  Patient Location: PACU  Anesthesia Type:General  Level of Consciousness: drowsy and patient cooperative  Airway & Oxygen Therapy: Patient Spontanous Breathing and Patient connected to face mask oxygen  Post-op Assessment: Report given to RN and Post -op Vital signs reviewed and stable  Post vital signs: Reviewed and stable  Last Vitals:  Vitals Value Taken Time  BP 142/70 08/22/24 15:38  Temp 36.3 C 08/22/24 15:38  Pulse 72 08/22/24 15:38  Resp 15 08/22/24 15:38  SpO2 100 % 08/22/24 15:38    Last Pain:  Vitals:   08/22/24 1538  TempSrc: Temporal  PainSc: 0-No pain         Complications: No notable events documented.

## 2024-08-25 ENCOUNTER — Ambulatory Visit: Payer: Self-pay

## 2024-08-25 ENCOUNTER — Encounter: Payer: Self-pay | Admitting: Emergency Medicine

## 2024-08-25 ENCOUNTER — Ambulatory Visit
Admission: EM | Admit: 2024-08-25 | Discharge: 2024-08-25 | Disposition: A | Discharge status: Home / Self Care | Attending: Family Medicine | Admitting: Family Medicine

## 2024-08-25 DIAGNOSIS — R310 Gross hematuria: Secondary | ICD-10-CM | POA: Diagnosis present

## 2024-08-25 DIAGNOSIS — Z7901 Long term (current) use of anticoagulants: Secondary | ICD-10-CM | POA: Diagnosis present

## 2024-08-25 LAB — URINALYSIS, W/ REFLEX TO CULTURE (INFECTION SUSPECTED)
RBC / HPF: >50 RBC/hpf (ref 0–5)
Squamous Epithelial / HPF: NONE SEEN /HPF (ref 0–5)
WBC, UA: NONE SEEN WBC/hpf (ref 0–5)

## 2024-08-25 NOTE — ED Triage Notes (Addendum)
 Pt c/o hematuria. Started today. He states he has a known enlarged prostate and has urinary frequency all the time. Denies abdominal pain. He was told by his PCP to go the ER. Pt is on Eliquis . .

## 2024-08-25 NOTE — Discharge Instructions (Signed)
 You had a lot of blood in your urine without evidence of a urinary tract infection.   You have been advised to follow up immediately in the emergency department for concerning signs or symptoms as discussed during your visit. If you declined EMS transport, please have a family member take you directly to the ED at this time. Do not delay.   Based on concerns about condition, if you do not follow up in the ED, you may risk poor outcomes including worsening of condition, delayed treatment and potentially life threatening issues. If you have declined to go to the ED at this time, you should call your PCP immediately to set up a follow up appointment.   Go to ED for red flag symptoms, including; fevers you cannot reduce with Tylenol /Motrin, severe headaches, vision changes, numbness/weakness in part of the body, lethargy, confusion, intractable vomiting, severe dehydration, chest pain, breathing difficulty, severe persistent abdominal or pelvic pain, signs of severe infection (increased redness, swelling of an area), feeling faint or passing out, dizziness, etc. You should especially go to the ED for sudden acute worsening of condition if you do not elect to go at this time.

## 2024-08-25 NOTE — Telephone Encounter (Signed)
 FYI Only or Action Required?: FYI only for provider.  Patient was last seen in primary care on 08/02/2024 by Edman Marsa PARAS, DO.  Called Nurse Triage reporting Hematuria, Urinary Frequency, Urinary Retention, and Urinary Urgency.  Symptoms began today.  Interventions attempted: Rest, hydration, or home remedies.  Symptoms are: rapidly worsening.  Triage Disposition: Go to ED Now (Notify PCP)  Patient/caregiver understands and will follow disposition?: Yes      Copied from CRM [id number]. Topic: Clinical - Red Word Triage >> Aug 25, 2024 11:38 AM DeAngela L wrote: Red Word that prompted transfer to Nurse Triage: patient had blood come out when he urinated just a few minutes ago   Pt num [id number] (M) Reason for Disposition  Passing pure blood or large blood clots (i.e., size > a dime)  (Exception: Fleck or small strands.)  Answer Assessment - Initial Assessment Questions 1. COLOR of URINE: Describe the color of the urine.  (e.g., tea-colored, pink, red, bloody) Do you have blood clots in your urine? (e.g., none, pea, grape, small coin)     Red stream came out, red, not watered down Was so concerned couldn't notice if toilet red or not 2. ONSET: When did the bleeding start?      Just few minutes ago 3. EPISODES: How many times has there been blood in the urine? or How many times today?     1x 4. PAIN with URINATION: Is there any pain with passing your urine? If Yes, ask: How bad is the pain?  (Scale 1-10; or mild, moderate, severe)     No pain just shock of seeing blood 5. FEVER: Do you have a fever? If Yes, ask: What is your temperature, how was it measured, and when did it start?     no 7. OTHER SYMPTOMS: Do you have any other symptoms? (e.g., back/flank pain, abdomen pain, vomiting)     No dizziness or abdominal pain, no fatigue, no back pain or vomiting, weakness, clammy, SOB, chest pain  Was fine, this morning difficult time urinating,  droplets only each time tried to go, kept feeling need to go to bathroom but couldn't go, for more than 4 hours, had full stream since just red, can't tell if contained urine Do not feel need to go to bathroom right now Wasn't thick blood, just like red urine At time couldn't see through it    Advised ED right away, have another adult drive, pt stated intent to go, wife will take him.  Protocols used: Urine - Blood In-A-AH

## 2024-08-25 NOTE — ED Provider Notes (Signed)
 [facility]    CSN: [id number] Arrival date & time: 08/25/24  1241      History   Chief Complaint Chief Complaint  Patient presents with   Hematuria     HPI HPI Andrew Benson is a 78 y.o. male.    Andrew Benson presents for hematuria that started last night and urinary frequency that started last night.  Takes Eliquis . Restarted yesterday after holding it for his left shoulder surgery. Denies history of bleeding like this with Eliquis  in the past. No history of kidney stones. Denies abdominal pain, dysuria, lower back pain, or fever.      Past Medical History:  Diagnosis Date   Aortic atherosclerosis (HCC)    Asthma    BPH (benign prostatic hyperplasia)    CKD (chronic kidney disease), stage II    Diastolic dysfunction    a. 12/2022 Echo: EF 55-60%, no rwma, GrI DD, mild LVH, nl RV fxn, Ao sclerosis. Ao root 40mm.   Dilated aortic root (HCC)    a. 12/2022 Echo: Ao root 40mm.   Elevated PSA    Erectile dysfunction    a.) on PDE5i (tadalafil )   Essential hypertension    Former smoker    History of cardiovascular stress test    a. 12/2022 Ex MV: EF 34%, freq PVCs, no ST/T changes, HTN response (211/98), no isch/infarct. CT attenuation images w/o significant Ao or Cor atherosclerosis.   Hyperlipidemia    Hypothyroidism    Left rotator cuff tear    Musculoskeletal chest pain    NSVT (nonsustained ventricular tachycardia) (HCC)    On apixaban  therapy    PAF (paroxysmal atrial fibrillation) (HCC)    a.) CHA2DS2VASc = 4 (age x2, HTN, vascular disease) as of 08/18/2024; b.) rate/rhythm maintained on oral metoprolol  tartrate + amiodarone  as needed; chronically anticoagulated with apixaban    Palpitations    a. 11/2022 Zio: Predominantly sinus rhythm at 65 (40-255).  4 runs of NSVT, longest 8 beats (162), fastest 255 (6 beats). 8 SVT runs, longests 15 beats (127), fastest 143 5 beats). Rare PACs/PVCs.   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   Eczema 04/30/2016   Incomplete bladder emptying 04/20/2016   Benign hypertension with CKD (chronic kidney disease), stage II    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   Tinnitus 07/12/2015   Hyperlipidemia 06/03/2015   Benign localized hyperplasia of prostate with urinary obstruction 03/12/2015   Hydrocele, bilateral 03/12/2015   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   Spermatocele 03/12/2015   Paroxysmal atrial fibrillation (HCC) 10/26/2013    Past Surgical History:  Procedure Laterality Date   BICEPT TENODESIS Left 08/22/2024   Procedure: TENODESIS, BICEPS;  Surgeon: Edie Norleen PARAS, MD;  Location: [facility] ORS;  Service: Orthopedics;  Laterality: Left;   CATARACT EXTRACTION Bilateral    COLONOSCOPY WITH PROPOFOL  N/A 12/29/2017   Procedure: COLONOSCOPY WITH PROPOFOL ;  Surgeon: Viktoria Lamar DASEN, MD;  Location: [facility] ENDOSCOPY;  Service: Endoscopy;  Laterality: N/A;   COLONOSCOPY WITH PROPOFOL  N/A 07/02/2021   Procedure: COLONOSCOPY  WITH PROPOFOL ;  Surgeon: Toledo, Ladell POUR, MD;  Location: [facility] ENDOSCOPY;  Service: Gastroenterology;  Laterality: N/A;   INGUINAL HERNIA REPAIR Right 2007   INGUINAL HERNIA REPAIR Left 2008   done x 2   KNEE SURGERY Left    POSTERIOR LUMBAR FUSION 2 WITH HARDWARE REMOVAL Left 08/22/2024   Procedure: ARTHROSCOPY, SHOULDER WITH DEBRIDEMENT;  Surgeon: Edie Norleen PARAS, MD;  Location: [facility] ORS;  Service: Orthopedics;  Laterality: Left;   SHOULDER OPEN ROTATOR CUFF REPAIR Left 08/22/2024   Procedure: REPAIR,  ROTATOR CUFF, OPEN;  Surgeon: Edie Norleen PARAS, MD;  Location: [facility] ORS;  Service: Orthopedics;  Laterality: Left;   SUBACROMIAL DECOMPRESSION Left 08/22/2024   Procedure: DECOMPRESSION, SUBACROMIAL SPACE;  Surgeon: Edie Norleen PARAS, MD;  Location: [facility] ORS;  Service: Orthopedics;  Laterality: Left;   TRIGGER FINGER RELEASE Left        Home Medications    Prior to Admission medications   Medication Sig Start Date End Date Taking? Authorizing Provider  acetaminophen  (TYLENOL ) 500 MG tablet Take 500-1,000 mg by mouth every 6 (six) hours as needed (pain.).   Yes [provider]  apixaban  (ELIQUIS ) 5 MG TABS tablet Take 1 tablet (5 mg total) by mouth 2 (two) times daily. 08/24/23  Yes Gollan, Timothy J, MD  metoprolol  tartrate (LOPRESSOR ) 25 MG tablet Take 0.5 tablets (12.5 mg total) by mouth 2 (two) times daily. 09/06/23  Yes Gollan, Timothy J, MD  Multiple Vitamins-Minerals (ADULT ONE DAILY GUMMIES PO) Take 2 each by mouth in the morning.   Yes [provider]  rosuvastatin  (CRESTOR ) 20 MG tablet Take 1 tablet (20 mg total) by mouth every evening. 08/22/24  Yes Poggi, Norleen PARAS, MD  tamsulosin  (FLOMAX ) 0.4 MG CAPS capsule Take 0.4 mg by mouth daily in the afternoon.   Yes [provider]  oxyCODONE  (ROXICODONE ) 5 MG immediate release tablet Take 1-2 tablets (5-10 mg total) by mouth every 4 (four) hours as needed for moderate pain (pain score 4-6) or severe pain (pain score 7-10). 08/22/24   Poggi, Norleen PARAS, MD  tadalafil  (CIALIS ) 10 MG tablet Take 1 tablet (10 mg total) by mouth daily as needed for erectile dysfunction. 08/22/24   Poggi, Norleen PARAS, MD    Family History Family History  Problem Relation Age of Onset   Hyperlipidemia Mother    Hypertension Mother    Heart disease Mother    Heart disease Sister        valve repair    Diabetes Brother    Hypertension Brother     Social History Social History   Tobacco Use   Smoking status: Former    Current packs/day: 0.00     Average packs/day: 1 pack/day for 20.0 years (20.0 ttl pk-yrs)    Types: Cigarettes    Start date: 02/01/1973    Quit date: 02/01/1993    Years since quitting: 31.5    Passive exposure: Past   Smokeless tobacco: Never  Vaping Use   Vaping status: Never Used  Substance Use Topics   Alcohol use: Not Currently    Comment: social drinker.   Drug use: No     Allergies   Penicillins   Review of Systems Review of Systems: :negative unless otherwise stated in HPI.      Physical Exam Triage Vital Signs ED Triage Vitals  Encounter Vitals Group     BP 08/25/24 1301 (!) 138/99     Girls Systolic BP Percentile --      Girls  Diastolic BP Percentile --      Boys Systolic BP Percentile --      Boys Diastolic BP Percentile --      Pulse Rate 08/25/24 1301 66     Resp 08/25/24 1301 18     Temp 08/25/24 1301 98.2 F (36.8 C)     Temp Source 08/25/24 1301 Oral     SpO2 08/25/24 1301 98 %     Weight 08/25/24 1259 178 lb (80.7 kg)     Height 08/25/24 1259 5' 11 (1.803 m)     Head Circumference --      Peak Flow --      Pain Score 08/25/24 1258 1     Pain Loc --      Pain Education --      Exclude from Growth Chart --    No data found.  Updated Vital Signs BP (!) 138/99 (BP Location: Right Arm)   Pulse 66   Temp 98.2 F (36.8 C) (Oral)   Resp 18   Ht 5' 11 (1.803 m)   Wt 80.7 kg   SpO2 98%   BMI 24.83 kg/m   Visual Acuity Right Eye Distance:   Left Eye Distance:   Bilateral Distance:    Right Eye Near:   Left Eye Near:    Bilateral Near:     Physical Exam GEN: well appearing male in no acute distress  CVS: well perfused  RESP: speaking in full sentences without pause  ABD: soft, non-tender, non-distended, no palpable masses, no CVA tenderness   SKIN: warm, dry    UC Treatments / Results  Labs (all labs ordered are listed, but only abnormal results are displayed) Labs Reviewed  URINALYSIS, W/ REFLEX TO CULTURE (INFECTION SUSPECTED) - Abnormal; Notable for  the following components:      Result Value   Color, Urine RED (*)    APPearance CLOUDY (*)    Glucose, UA   (*)    Value: TEST NOT REPORTED DUE TO COLOR INTERFERENCE OF URINE PIGMENT   Hgb urine dipstick   (*)    Value: TEST NOT REPORTED DUE TO COLOR INTERFERENCE OF URINE PIGMENT   Bilirubin Urine   (*)    Value: TEST NOT REPORTED DUE TO COLOR INTERFERENCE OF URINE PIGMENT   Ketones, ur   (*)    Value: TEST NOT REPORTED DUE TO COLOR INTERFERENCE OF URINE PIGMENT   Protein, ur   (*)    Value: TEST NOT REPORTED DUE TO COLOR INTERFERENCE OF URINE PIGMENT   Nitrite   (*)    Value: TEST NOT REPORTED DUE TO COLOR INTERFERENCE OF URINE PIGMENT   Leukocytes,Ua   (*)    Value: TEST NOT REPORTED DUE TO COLOR INTERFERENCE OF URINE PIGMENT   Bacteria, UA RARE (*)    All other components within normal limits  URINE CULTURE    EKG   Radiology No results found.  Procedures Procedures (including critical care time)  Medications Ordered in UC Medications - No data to display  Initial Impression / Assessment and Plan / UC Course  I have reviewed the triage vital signs and the nursing notes.  Pertinent labs & imaging results that were available during my care of the patient were reviewed by me and considered in my medical decision making (see chart for details).        Patient is a 78 y.o. male  who takes Eliquis  presents for 1 day of gross hematuria and urinary frequency.  Overall patient is well-appearing and afebrile.  Vital signs stable.  Urinalysis not useful due to the red pigmentation of his urine. Has rare bacteria and hematuria on microscopy. Urine culture obtained.  Follow-up sensitivities and change antibiotics, if needed.   Discussed labs however its Friday, unable for follow up with his urologist, Dr Francisca, tomorrow.  Recommended ED evaluation as he may need a CT Scan to evaluate his kidneys and urinary tract given the gross hematuria.  Pt [facility] to [facility] ED  for further evaluation.   Return precautions including abdominal pain, fever, chills, nausea, or vomiting given. Follow-up,  if symptoms not improving or getting worse. Discussed MDM, treatment plan and plan for follow-up with patient and his wife who agrees with plan.        Final Clinical Impressions(s) / UC Diagnoses   Final diagnoses:  Gross hematuria  Chronic anticoagulation     Discharge Instructions      You had a lot of blood in your urine without evidence of a urinary tract infection.   You have been advised to follow up immediately in the emergency department for concerning signs or symptoms as discussed during your visit. If you declined EMS transport, please have a family member take you directly to the ED at this time. Do not delay.   Based on concerns about condition, if you do not follow up in the ED, you may risk poor outcomes including worsening of condition, delayed treatment and potentially life threatening issues. If you have declined to go to the ED at this time, you should call your PCP immediately to set up a follow up appointment.   Go to ED for red flag symptoms, including; fevers you cannot reduce with Tylenol /Motrin, severe headaches, vision changes, numbness/weakness in part of the body, lethargy, confusion, intractable vomiting, severe dehydration, chest pain, breathing difficulty, severe persistent abdominal or pelvic pain, signs of severe infection (increased redness, swelling of an area), feeling faint or passing out, dizziness, etc. You should especially go to the ED for sudden acute worsening of condition if you do not elect to go at this time.       ED Prescriptions   None    PDMP not reviewed this encounter.   Quynn Vilchis, DO 08/25/24 1416

## 2024-08-29 ENCOUNTER — Telehealth: Payer: Self-pay | Admitting: Cardiovascular Disease

## 2024-08-29 NOTE — Telephone Encounter (Signed)
 Pt states he is returning a call to the office

## 2024-08-30 NOTE — Telephone Encounter (Signed)
 Patient reports that he was seen in [facility] ED for blood in his urine on 08/25/24. Patient says that he had shoulder surgery on 08/22/24 and had stopped Eliquis  3 days prior. Patient started Eliquis  back on 08/24/24. When patient was seen in the ED he was told that they believe the bleeding was from Eliquis . Patient has not taken his Eliquis  since and denies any bleeding since 08/25/24. Patient would like to know if he should restart Eliquis .

## 2024-08-31 ENCOUNTER — Other Ambulatory Visit: Payer: Self-pay | Admitting: Cardiovascular Disease

## 2024-08-31 DIAGNOSIS — E785 Hyperlipidemia, unspecified: Secondary | ICD-10-CM

## 2024-09-04 NOTE — Telephone Encounter (Signed)
 Called patient and notified him of the following from Dr. Gollan.  It appears on his recent ER visit Eliquis  and he was referred to urology He should be low risk to hold Eliquis  while he undergoes urology workup though would recommend closely tracking heart rate and rhythm from home Thx TGollan  Patient verbalizes understanding.

## 2024-09-11 ENCOUNTER — Ambulatory Visit

## 2024-09-11 DIAGNOSIS — Z23 Encounter for immunization: Secondary | ICD-10-CM

## 2024-09-15 ENCOUNTER — Ambulatory Visit: Admitting: Family Medicine

## 2024-09-15 ENCOUNTER — Encounter: Payer: Self-pay | Admitting: Family Medicine

## 2024-09-15 VITALS — BP 138/74 | HR 88 | Ht 71.0 in | Wt 179.1 lb

## 2024-09-15 DIAGNOSIS — R310 Gross hematuria: Secondary | ICD-10-CM | POA: Diagnosis not present

## 2024-09-15 DIAGNOSIS — R972 Elevated prostate specific antigen [PSA]: Secondary | ICD-10-CM

## 2024-09-15 DIAGNOSIS — N138 Other obstructive and reflux uropathy: Secondary | ICD-10-CM

## 2024-09-15 DIAGNOSIS — N401 Enlarged prostate with lower urinary tract symptoms: Secondary | ICD-10-CM | POA: Diagnosis not present

## 2024-09-15 NOTE — Progress Notes (Signed)
 Subjective:    Patient ID: Andrew Benson, male    DOB: [birth year], 78 y.o.   MRN: [id number]  Andrew Benson is a 78 y.o. male presenting on 09/15/2024 for Hospitalization Follow-up and Gross Hematuria   HPI  Discussed the use of AI scribe software for clinical note transcription with the patient, who gave verbal consent to proceed.  History of Present Illness   Andrew Benson is a 78 year old male who presents with gross hematuria.  Gross hematuria - Onset September 5th, characterized by red blood stream during urination and continuous dripping of blood - No associated pain or burning with urination - Prompted urgent care visit on September 5th UC and ED at [facility] - No further episodes of bleeding since discontinuing Eliquis  on September 5th - Briefly resumed Eliquis , which resulted in recurrent bleeding; subsequently discontinued Eliquis  with no further bleeding - No involuntary urination - Cardiology has given him permission to PAUSE Eliquis  while further Urology work up going on and he can follow up with them.  Nephrolithiasis and genitourinary findings - CT scan identified a kidney stone at the right ureterovesicular junction - No associated flank or back pain - Further evaluation at [facility] revealed a renal calculus and cystic lesions on the prostate - No prior scopes or invasive urologic procedures performed - Upcoming urology appointment scheduled for October 8th to see new Urologist He has previous [facility] but he prefers to switch at this time, they were following elevated PSA  Anticoagulation therapy - Previously on Eliquis , discontinued on September 5th after onset of hematuria - Briefly resumed Eliquis , which resulted in recurrent bleeding; subsequently discontinued again - No further bleeding since stopping Eliquis   Other imaging findings - Imaging noted slightly dilated bile ducts         07/25/2024   11:05 AM 07/25/2024   11:04 AM 03/20/2024     1:39 PM  Depression screen PHQ 2/9  Decreased Interest 0 0 0  Down, Depressed, Hopeless 0 0 0  PHQ - 2 Score 0 0 0  Altered sleeping 0  0  Tired, decreased energy 0  0  Change in appetite 0  0  Feeling bad or failure about yourself  0  0  Trouble concentrating 0  0  Moving slowly or fidgety/restless 1  0  Suicidal thoughts 0  0  PHQ-9 Score 1  0  Difficult doing work/chores Not difficult at all  Not difficult at all       07/25/2024   11:05 AM 03/20/2024    1:39 PM 07/01/2023    9:18 AM 02/03/2023    2:45 PM  GAD 7 : Generalized Anxiety Score  Nervous, Anxious, on Edge 0 0 0 0  Control/stop worrying 0 0 0 0  Worry too much - different things 0 0 0 0  Trouble relaxing 0 0 0 0  Restless 0 0 0 0  Easily annoyed or irritable 0 0 0 0  Afraid - awful might happen 0 0 0 0  Total GAD 7 Score 0 0 0 0  Anxiety Difficulty  Not difficult at all Not difficult at all Not difficult at all    Social History   Tobacco Use   Smoking status: Former    Current packs/day: 0.00    Average packs/day: 1 pack/day for 20.0 years (20.0 ttl pk-yrs)    Types: Cigarettes    Start date: 02/01/1973    Quit date: 02/01/1993    Years since quitting: 31.6  Passive exposure: Past   Smokeless tobacco: Never  Vaping Use   Vaping status: Never Used  Substance Use Topics   Alcohol use: Not Currently    Comment: social drinker.   Drug use: No    Review of Systems Per HPI unless specifically indicated above     Objective:    BP 138/74 (BP Location: Left Arm, Cuff Size: Normal)   Pulse 88   Ht 5' 11 (1.803 m)   Wt 179 lb 2 oz (81.3 kg)   SpO2 98%   BMI 24.98 kg/m   Wt Readings from Last 3 Encounters:  09/15/24 179 lb 2 oz (81.3 kg)  08/25/24 178 lb (80.7 kg)  08/14/24 178 lb (80.7 kg)    Physical Exam Vitals and nursing note reviewed.  Constitutional:      General: He is not in acute distress.    Appearance: Normal appearance. He is well-developed. He is not diaphoretic.     Comments:  Well-appearing, comfortable, cooperative  HENT:     Head: Normocephalic and atraumatic.  Eyes:     General:        Right eye: No discharge.        Left eye: No discharge.     Conjunctiva/sclera: Conjunctivae normal.  Cardiovascular:     Rate and Rhythm: Normal rate.  Pulmonary:     Effort: Pulmonary effort is normal.  Skin:    General: Skin is warm and dry.     Findings: No erythema or rash.  Neurological:     Mental Status: He is alert and oriented to person, place, and time.  Psychiatric:        Mood and Affect: Mood normal.        Behavior: Behavior normal.        Thought Content: Thought content normal.     Comments: Well groomed, good eye contact, normal speech and thoughts     CT Abdomen Pelvis W IV Contrast  Anatomical Region Laterality Modality  Abdomen -- Computed Tomography  Pelvis -- --   Impression  Renal calculus noted at the right ureterovesicular junction. No evidence of associated right hydroureteronephrosis.  Heterogeneously enhancing enlarged prostate with multifocal cystic areas. Recommend correlation with patient's PSA values. Consider obtaining nonemergent outpatient prostate MRI for further characterization.   Mild to moderate intrahepatic biliary ductal dilatation with associated dilation of the common bile duct to 1.2 cm.Recommend further evaluation with nonemergent outpatient MRI abdomen/MRCP  FOLLOW-UP RECOMMENDATION:  Item for Follow Up: 1. Acuity: Subacute 2. Modality: MR 3. Anatomy: Abdomen 4. TimeFrame: 1 Month Narrative  EXAM: CT ABDOMEN PELVIS W CONTRAST ACCESSION: [id number] UN REPORT DATE: 08/25/2024 5:40 PM CLINICAL INDICATION: 78 years old with suprapubic discomfort and hematuria    COMPARISON: None  TECHNIQUE: A helical CT scan of the abdomen and pelvis was obtained following IV contrast from the lung bases through the pubic symphysis. Images were reconstructed in the axial plane. Coronal and sagittal reformatted images were  also provided for further evaluation.   FINDINGS:  LOWER CHEST: Mild coronary artery calcifications.  LIVER: Normal liver contour.  No focal liver lesions.  BILIARY: The gallbladder is normal in appearance. Mild to moderate intrahepatic biliary ductal dilatation. Common bile duct measures 1.2 cm.  SPLEEN: Normal in size and contour.  PANCREAS: Normal pancreatic contour. Subcentimeter hypodensities which are too small to characterize on CT no ductal dilation.  ADRENAL GLANDS: Normal appearance of the adrenal glands.  KIDNEYS/URETERS: Symmetric renal enhancement.  No hydronephrosis.  No solid renal  mass.  BLADDER: Renal calculus noted at the right ureterovesicular junction (2:126).  REPRODUCTIVE ORGANS: Heterogeneous enlarged prostate with multifocal areas of hyperenhancement and simple cystic fluid collections measuring up to 2.3 x 1.6 x 2.1 cm (2:136, 3:58).  GI TRACT: Colonic diverticulosis. No findings of bowel obstruction or acute inflammation. Normal appendix (2:106).  PERITONEUM, RETROPERITONEUM AND MESENTERY: No free air.  No ascites.  No fluid collection.  LYMPH NODES: No adenopathy.  VESSELS: Hepatic and portal veins are patent.  Normal caliber aorta.    BONES and SOFT TISSUES: No aggressive osseous lesions.  No focal soft tissue lesions. Calcifications noted within the left rectus abdominous muscle. Left inguinal hernia containing fat and small volume simple fluid. Procedure Note  Asiedu, Ellouise Berg, MD - 08/25/2024 Formatting of this note might be different from the original. EXAM: CT ABDOMEN PELVIS W CONTRAST ACCESSION: [id number] UN REPORT DATE: 08/25/2024 5:40 PM CLINICAL INDICATION: 79 years old with suprapubic discomfort and hematuria  COMPARISON: None  TECHNIQUE: A helical CT scan of the abdomen and pelvis was obtained following IV contrast from the lung bases through the pubic symphysis. Images were reconstructed in the axial plane. Coronal and sagittal  reformatted images were also provided for further evaluation.   FINDINGS:  LOWER CHEST: Mild coronary artery calcifications.  LIVER: Normal liver contour.  No focal liver lesions.  BILIARY: The gallbladder is normal in appearance. Mild to moderate intrahepatic biliary ductal dilatation. Common bile duct measures 1.2 cm.  SPLEEN: Normal in size and contour.  PANCREAS: Normal pancreatic contour. Subcentimeter hypodensities which are too small to characterize on CT no ductal dilation.  ADRENAL GLANDS: Normal appearance of the adrenal glands.  KIDNEYS/URETERS: Symmetric renal enhancement.  No hydronephrosis.  No solid renal mass.  BLADDER: Renal calculus noted at the right ureterovesicular junction (2:126).  REPRODUCTIVE ORGANS: Heterogeneous enlarged prostate with multifocal areas of hyperenhancement and simple cystic fluid collections measuring up to 2.3 x 1.6 x 2.1 cm (2:136, 3:58).  GI TRACT: Colonic diverticulosis. No findings of bowel obstruction or acute inflammation. Normal appendix (2:106).  PERITONEUM, RETROPERITONEUM AND MESENTERY: No free air.  No ascites.  No fluid collection.  LYMPH NODES: No adenopathy.  VESSELS: Hepatic and portal veins are patent.  Normal caliber aorta.    BONES and SOFT TISSUES: No aggressive osseous lesions.  No focal soft tissue lesions. Calcifications noted within the left rectus abdominous muscle. Left inguinal hernia containing fat and small volume simple fluid.   IMPRESSION: Renal calculus noted at the right ureterovesicular junction. No evidence of associated right hydroureteronephrosis.  Heterogeneously enhancing enlarged prostate with multifocal cystic areas. Recommend correlation with patient's PSA values. Consider obtaining nonemergent outpatient prostate MRI for further characterization.   Mild to moderate intrahepatic biliary ductal dilatation with associated dilation of the common bile duct to 1.2 cm.Recommend further evaluation  with nonemergent outpatient MRI abdomen/MRCP  FOLLOW-UP RECOMMENDATION:  Item for Follow Up: 1. Acuity: Subacute 2. Modality: MR 3. Anatomy: Abdomen 4. TimeFrame: 1 Month Exam End: 08/25/24 17:34   Specimen Collected: 08/25/24 17:40 Last Resulted: 08/25/24 20:14  Received From: [facility]  Result Received: 09/11/24 08:28     Results for orders placed or performed during the hospital encounter of 08/25/24  Urinalysis, w/ Reflex to Culture (Infection Suspected) -Urine, Clean Catch   Collection Time: 08/25/24  1:06 PM  Result Value Ref Range   Specimen Source URINE, CLEAN CATCH    Color, Urine RED (A) YELLOW   APPearance CLOUDY (A) CLEAR   Specific Gravity, Urine  1.005 - 1.030    TEST NOT REPORTED DUE TO COLOR INTERFERENCE OF URINE PIGMENT   pH  5.0 - 8.0    TEST NOT REPORTED DUE TO COLOR INTERFERENCE OF URINE PIGMENT   Glucose, UA (A) NEGATIVE mg/dL    TEST NOT REPORTED DUE TO COLOR INTERFERENCE OF URINE PIGMENT   Hgb urine dipstick (A) NEGATIVE    TEST NOT REPORTED DUE TO COLOR INTERFERENCE OF URINE PIGMENT   Bilirubin Urine (A) NEGATIVE    TEST NOT REPORTED DUE TO COLOR INTERFERENCE OF URINE PIGMENT   Ketones, ur (A) NEGATIVE mg/dL    TEST NOT REPORTED DUE TO COLOR INTERFERENCE OF URINE PIGMENT   Protein, ur (A) NEGATIVE mg/dL    TEST NOT REPORTED DUE TO COLOR INTERFERENCE OF URINE PIGMENT   Nitrite (A) NEGATIVE    TEST NOT REPORTED DUE TO COLOR INTERFERENCE OF URINE PIGMENT   Leukocytes,Ua (A) NEGATIVE    TEST NOT REPORTED DUE TO COLOR INTERFERENCE OF URINE PIGMENT   Squamous Epithelial / HPF NONE SEEN 0 - 5 /HPF   WBC, UA NONE SEEN 0 - 5 WBC/hpf   RBC / HPF >50 0 - 5 RBC/hpf   Bacteria, UA RARE (A) NONE SEEN      Assessment & Plan:   Problem List Items Addressed This Visit     Benign localized hyperplasia of prostate with urinary obstruction   Elevated PSA   Other Visit Diagnoses       Gross hematuria    -  Primary        Gross Hematuria Onset  08/25/24 significant episode without recurrence Uncertain etiology given at that time Eliquis  was just restarted post-op and symptoms occurred, Cardiology has taken him off of Eliquis  at this time while waiting for Urology work up to determine cause.  Urgent Care 9/5 and ED [facility] 9/5  Urinalysis, CT Imaging, identified  Right ureterovesicular junction kidney stone Kidney stone at right ureterovesicular junction without obstruction or hydronephrosis. No typical pain. Possible hematuria source.  Also prostatic cystic lesion History of elevated PSA monitored by [facility] Dr Francisca previously We have monitored his PSA lab for repeat, last lab showed PSA 6.4 free % at 20  Prostatic cystic lesions could cause bleeding. No malignancy indicated. Further urology evaluation needed.  Discussion today regarding further work up. Advised very important to pursue Urology work up. He may warrant repeat image and or cystoscopy. For now stay off Eliquis , future can follow back up with Cardiology when work up is done  He is already scheduled w/ new Urologist [facility] on 09/27/24 He prefers to keep this apt and consultation and will follow further evaluation from Urologist to determine cause of gross hematuria and further management of elevated PSA, he will likely transfer urology care to [facility] location       No orders of the defined types were placed in this encounter.   No orders of the defined types were placed in this encounter.   Follow up plan: Return if symptoms worsen or fail to improve.   Marsa Officer, DO [facility] 09/15/2024, 2:18 PM

## 2024-09-15 NOTE — Patient Instructions (Addendum)
 Thank you for coming to the office today.    Please schedule a Follow-up Appointment to: Return if symptoms worsen or fail to improve.  If you have any other questions or concerns, please feel free to call the office or send a message through MyChart. You may also schedule an earlier appointment if necessary.  Additionally, you may be receiving a survey about your experience at our office within a few days to 1 week by e-mail or mail. We value your feedback.  Marsa Officer, DO [facility], [place]

## 2024-09-23 ENCOUNTER — Other Ambulatory Visit: Payer: Self-pay | Admitting: Cardiovascular Disease

## 2024-09-27 ENCOUNTER — Ambulatory Visit: Admitting: Nurse Practitioner

## 2024-09-28 ENCOUNTER — Other Ambulatory Visit: Payer: Self-pay | Admitting: Cardiovascular Disease

## 2024-09-28 DIAGNOSIS — E785 Hyperlipidemia, unspecified: Secondary | ICD-10-CM

## 2024-09-29 ENCOUNTER — Ambulatory Visit: Attending: Nurse Practitioner | Admitting: Nurse Practitioner

## 2024-09-29 ENCOUNTER — Encounter: Payer: Self-pay | Admitting: Nurse Practitioner

## 2024-09-29 VITALS — BP 152/92 | HR 85 | Resp 18 | Ht 71.0 in | Wt 182.2 lb

## 2024-09-29 DIAGNOSIS — N182 Chronic kidney disease, stage 2 (mild): Secondary | ICD-10-CM | POA: Diagnosis not present

## 2024-09-29 DIAGNOSIS — I480 Paroxysmal atrial fibrillation: Secondary | ICD-10-CM

## 2024-09-29 DIAGNOSIS — E782 Mixed hyperlipidemia: Secondary | ICD-10-CM

## 2024-09-29 DIAGNOSIS — I10 Essential (primary) hypertension: Secondary | ICD-10-CM

## 2024-09-29 MED ORDER — LISINOPRIL 2.5 MG PO TABS: 2.5000 mg | ORAL_TABLET | Freq: Every day | ORAL | 6 refills | Status: AC

## 2024-09-29 NOTE — Progress Notes (Signed)
 Office Visit    Patient Name: Andrew Benson Date of Encounter: 09/29/2024  Primary Care Provider:  Edman Marsa PARAS, DO Primary Cardiologist:  Andrew Lunger, MD    Chief Complaint    78 y.o. male with a PMH of PAF, remoye tobacco abuse, asthma, CKD stage IIIa, dilated aortic root, essential hypertension, hyperlipidemia, PSVT, NSVT, and hematuria, presenting to clinic for follow up regarding PAF and hypertension.  Past Medical History   Subjective   Past Medical History:  Diagnosis Date   Aortic atherosclerosis    Asthma    BPH (benign prostatic hyperplasia)    CKD (chronic kidney disease), stage II    Diastolic dysfunction    a. 12/2022 Echo: EF 55-60%, no rwma, GrI DD, mild LVH, nl RV fxn, Ao sclerosis. Ao root 40mm.   Dilated aortic root    a. 12/2022 Echo: Ao root 40mm.   Elevated PSA    Erectile dysfunction    a.) on PDE5i (tadalafil )   Essential hypertension    Former smoker    History of cardiovascular stress test    a. 12/2022 Ex MV: EF 34%, freq PVCs, no ST/T changes, HTN response (211/98), no isch/infarct. CT attenuation images w/o significant Ao or Cor atherosclerosis.   Hyperlipidemia    Hypothyroidism    Left rotator cuff tear    Musculoskeletal chest pain    NSVT (nonsustained ventricular tachycardia) (HCC)    On apixaban  therapy    PAF (paroxysmal atrial fibrillation) (HCC)    a.) CHA2DS2VASc = 4 (age x2, HTN, vascular disease) as of 08/18/2024; b.) rate/rhythm maintained on oral metoprolol  tartrate + amiodarone  as needed; chronically anticoagulated with apixaban    Palpitations    a. 11/2022 Zio: Predominantly sinus rhythm at 65 (40-255).  4 runs of NSVT, longest 8 beats (162), fastest 255 (6 beats). 8 SVT runs, longests 15 beats (127), fastest 143 5 beats). Rare PACs/PVCs.   Polyp of colon, unspecified part of colon, unspecified type    PSVT (paroxysmal supraventricular tachycardia)    Status post bilateral cataract extraction     Thrombocytopenia    TMJ locking    Past Surgical History:  Procedure Laterality Date   BICEPT TENODESIS Left 08/22/2024   Procedure: TENODESIS, BICEPS;  Surgeon: Edie Norleen PARAS, MD;  Location: [facility] ORS;  Service: Orthopedics;  Laterality: Left;   CATARACT EXTRACTION Bilateral    COLONOSCOPY WITH PROPOFOL  N/A 12/29/2017   Procedure: COLONOSCOPY WITH PROPOFOL ;  Surgeon: Viktoria Lamar DASEN, MD;  Location: [facility]COPY;  Service: Endoscopy;  Laterality: N/A;   COLONOSCOPY WITH PROPOFOL  N/A 07/02/2021   Procedure: COLONOSCOPY WITH PROPOFOL ;  Surgeon: Toledo, Ladell POUR, MD;  Location: [facility] ENDOSCOPY;  Service: Gastroenterology;  Laterality: N/A;   INGUINAL HERNIA REPAIR Right 2007   INGUINAL HERNIA REPAIR Left 2008   done x 2   KNEE SURGERY Left    POSTERIOR LUMBAR FUSION 2 WITH HARDWARE REMOVAL Left 08/22/2024   Procedure: ARTHROSCOPY, SHOULDER WITH DEBRIDEMENT;  Surgeon: Edie Norleen PARAS, MD;  Location: [facility] ORS;  Service: Orthopedics;  Laterality: Left;   SHOULDER OPEN ROTATOR CUFF REPAIR Left 08/22/2024   Procedure: REPAIR, ROTATOR CUFF, OPEN;  Surgeon: Edie Norleen PARAS, MD;  Location: [facility];  Service: Orthopedics;  Laterality: Left;   SUBACROMIAL DECOMPRESSION Left 08/22/2024   Procedure: DECOMPRESSION, SUBACROMIAL SPACE;  Surgeon: Edie Norleen PARAS, MD;  Location: [facility] OR[facility];  Service: Orthopedics;  Laterality: Left;   TRIGGER FINGER RELEASE Left     Allergies  Allergies  Allergen Reactions  Penicillins Rash       History of Present Illness      78 y.o. y/o male with a PMH of PAF, tobacco abuse, asthma, CKD stage IIIa, dilated aortic root, essential hypertension, hyperlipidemia, PSVT, NSVT. He has a history of atrial fibrillation dating back approximately 25 years.  Most recent profound episode occurred in 2019, while drinking coffee.  He has been anticoagulated with Eliquis  in the setting of a CHA2DS2-VASc 3-4 (hypertension, age x 2, chronic small vessel disease noted on brain MRI previously) and  has a prescription for amiodarone  to be used as needed.  He has historically taken metoprolol  12.5 mg twice daily for rate control.  In December 2023, he reported intermittent palpitations associated with presyncope.  ZIO monitoring was performed and showed brief runs of nonsustained VT and PSVT as outlined in the past medical history.  There were no triggered events however, he says that he forgot he was supposed to trigger the monitor during symptoms.  At follow-up in January, he reported some dyspnea on exertion with reduced activity tolerance.  In the setting of the symptoms nonsustained VT on monitoring, decision was made to perform stress testing.  Prior to this occurring however, January 16, he was seen in the emergency department with palpitations and hypertension.  Workup was unremarkable and he was discharged home.  He underwent an exercise Myoview  on January 08, 2023 showing an EF of 34% with mild global hypokinesis without ischemia.  He was noted to have frequent PVCs at stage I and stage II as well as hypertensive response to exercise (211/90).  CT attenuation corrected images showed no significant aortic atherosclerosis or coronary calcification.  In the setting of LV dysfunction on stress testing, an echocardiogram was undertaken and showed an EF of 55 to 60% without regional wall motion abnormalities, mild LVH, grade 1 diastolic dysfunction, aortic sclerosis, and aortic root measuring 40 mm.     [name] was seen in the ED in early September 2025 for hematuria with BRB that worsened in the setting of restarting his Eliquis  after surgery for rotator cuff (08/22/2024). Eliquis  was stopped and he has not had any more hematuria.  He is now followed by Urology @ [facility] with recommendation for cytoscopy with urine cytology. From a cardiac standpoint, he has done well.  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 He tries to  eat a balanced diet, wife cooks a lot of meals at home.  He denies chest pain, palpitations, dyspnea, pnd, orthopnea, n, v, dizziness, syncope, weight gain, or early satiety.    Objective   Home Medications    Current Outpatient Medications  Medication Sig Dispense Refill   acetaminophen  (TYLENOL ) 500 MG tablet Take 500-1,000 mg by mouth every 6 (six) hours as needed (pain.).     lisinopril  (ZESTRIL ) 2.5 MG tablet Take 1 tablet (2.5 mg total) by mouth daily. 30 tablet 6   Multiple Vitamins-Minerals (ADULT ONE DAILY GUMMIES PO) Take 2 each by mouth in the morning.     rosuvastatin  (CRESTOR ) 20 MG tablet TAKE 1 TABLET BY MOUTH EVERY DAY 90 tablet 0   tadalafil  (CIALIS ) 10 MG tablet Take 1 tablet (10 mg total) by mouth daily as needed for erectile dysfunction.     apixaban  (ELIQUIS ) 5 MG TABS tablet Take 1 tablet (5 mg total) by mouth 2 (two) times daily. (Patient not taking: Reported on 09/15/2024) 180 tablet  3   tamsulosin  (FLOMAX ) 0.4 MG CAPS capsule Take 0.4 mg by mouth daily in the afternoon. (Patient not taking: Reported on 09/29/2024)     No current facility-administered medications for this visit.     Physical Exam    VS:  BP (!) 152/92   Pulse 85   Resp 18   Ht 5' 11 (1.803 m)   Wt 182 lb 3.2 oz (82.6 kg)   SpO2 98%   BMI 25.41 kg/m  , BMI Body mass index is 25.41 kg/m.    Vitals:   09/29/24 0950 09/29/24 1251  BP: (!) 142/80 (!) 152/92  Pulse:    Resp:    SpO2:            GEN: Well nourished, well developed, in no acute distress. HEENT: normal. Neck: Supple, no JVD, carotid bruits, or masses. Cardiac: RRR, no murmurs, rubs, or gallops. No clubbing, cyanosis. Trace edema in ankles bilaterally. Radials 2+/PT 2+ and equal bilaterally.  Respiratory:  Respirations regular and unlabored, clear to auscultation bilaterally. GI: Soft, nontender, nondistended, BS + x 4. MS: no deformity or atrophy. Skin: warm and dry, no rash. Neuro:  Strength and sensation are  intact. Psych: Normal affect.  Accessory Clinical Findings    ECG personally reviewed by me today - EKG Interpretation Date/Time:  Friday September 29 2024 09:13:00 EDT Ventricular Rate:  60 PR Interval:  140 QRS Duration:  110 QT Interval:  414 QTC Calculation: 414 R Axis:   56  Text Interpretation: Normal sinus rhythm Biatrial enlargement Confirmed by Vivienne Bruckner [phone]) on 09/29/2024 9:16:25 AM  - no acute changes.  Lab Results  Component Value Date   WBC 4.5 06/27/2024   HGB 14.7 06/27/2024   HCT 43.5 06/27/2024   MCV 93.8 06/27/2024   PLT 133 (L) 06/27/2024   Lab Results  Component Value Date   CREATININE 1.29 (H) 06/27/2024   BUN 18 06/27/2024   NA 143 06/27/2024   K 3.9 06/27/2024   CL 109 06/27/2024   CO2 28 06/27/2024   Lab Results  Component Value Date   ALT 16 06/27/2024   AST 20 06/27/2024   ALKPHOS 77 01/05/2023   BILITOT 0.8 06/27/2024   Lab Results  Component Value Date   CHOL 180 06/27/2024   HDL 60 06/27/2024   LDLCALC 104 (H) 06/27/2024   TRIG 73 06/27/2024   CHOLHDL 3.0 06/27/2024    Lab Results  Component Value Date   HGBA1C 5.6 06/27/2024   Lab Results  Component Value Date   TSH 0.40 06/27/2024       Assessment & Plan    1.  PAF -Most recent profound episode occurred in 2019, while drinking coffee.   -Was on metoprolol  25 mg twice daily which was later reduced to 12.5 mg twice daily for rate control in setting of bradycardia -Previously on Eliquis  for anticoagulation, stopped due to hematuria until further urology workup is completed. Patient also stopped his metoprolol  at this time because he assumed they should based off both being prescribed by cardiology.  As HR was 49 in ED and 60 today, will cont to hold off on ? blocker. -Will defer to urology to decide if safe to resume Eliquis  (pt would like to explore other options due to cost - ? Generic pradaxa). - He has amiodarone  for use as needed - has not used recently.  2.  Hypertension -Does not check blood pressures at home. BP typically 110's systolic at office when previously  on metoprolol , 142/80 at today's visit.  -Not starting metoprolol  as above -Will restart lisinopril  2.5 mg daily as has additional renal benefit in setting of CKD. Patient has tolerated well without side effects when taking lisinopril  in the past - he's not sure why he stopped taking it in the past.  3. CKD, stage IIIa -Creatinine 1.31, eGFR 56 in 08/2024 -ACEI for renal protection as above  4. Hyperlipidemia -LDL 104 in 06/2024 -Continue rosuvastatin  20 mg daily -Encouraged exercise and healthy eating  5. Edema -Minimal edema in ankles bilaterally -Recommended higher socks for compression as needed  6. Disposition -Follow up in 6 months or sooner as needed  Lonni Meager, NP 09/29/2024, 12:52 PM

## 2024-09-29 NOTE — Patient Instructions (Signed)
 Medication Instructions:   Your physician recommends the following medication changes.  STOP TAKING: Metoprolol    START TAKING: Lisinopril  2.5 mg once daily   *If you need a refill on your cardiac medications before your next appointment, please call your pharmacy*  Lab Work:  Your provider would like for you to return in 1 Week to have the following labs drawn: BMet.   Please go to [facility] [address] ([facility] [address], [place] [address] You do not need an appointment.  They are open from 8 am- 4:30 pm.  Lunch from 1:00 pm- 2:00 pm You DO NOT need to be fasting.   You may also go to one of the following [facility]:  [address] [place], [place] [address] Phone: [phone] Lab hours: Mon-Fri 8 am- 5 pm    Lunch 12 pm- 1 pm  [address] [place],  [place]  [address]  [place]  Phone: [phone] Lab hours: 7 am- 4 pm Lunch 12 pm-1 pm   [address] [place],  [place]  [address]  [place]  Phone: [phone] Lab hours: Mon-Fri 8 am- 5 pm    Lunch 12 pm- 1 pm   If you have labs (blood work) drawn today and your tests are completely normal, you will receive your results only by:  MyChart Message (if you have MyChart) OR  A paper copy in the mail If you have any lab test that is abnormal or we need to change your treatment, we will call you to review the results.  Testing/Procedures:  None ordered at this time   Referrals:  None ordered at this time   Follow-Up:  At [facility], you and your health needs are our priority.  As part of our continuing mission to provide you with exceptional heart care, our providers are all part of one team.  This team includes your primary Cardiologist (physician) and Advanced Practice Providers or APPs (Physician Assistants and Nurse Practitioners) who all work together to provide you with the care you need, when you need it.  Your next appointment:   6 month(s)  Provider:    You may see  Timothy Gollan, MD or one of the following Advanced Practice Providers on your designated Care Team:   Lonni Meager, NP Lesley Maffucci, PA-C Bernardino Bring, PA-C Cadence Corinth, PA-C Tylene Lunch, NP Barnie Hila, NP    We recommend signing up for the patient portal called MyChart.  Sign up information is provided on this After Visit Summary.  MyChart is used to connect with patients for Virtual Visits (Telemedicine).  Patients are able to view lab/test results, encounter notes, upcoming appointments, etc.  Non-urgent messages can be sent to your provider as well.   To learn more about what you can do with MyChart, go to [email or web address].

## 2024-10-02 ENCOUNTER — Ambulatory Visit: Admitting: Family Medicine

## 2024-10-14 ENCOUNTER — Other Ambulatory Visit: Payer: Self-pay | Admitting: Cardiovascular Disease

## 2024-10-14 DIAGNOSIS — I480 Paroxysmal atrial fibrillation: Secondary | ICD-10-CM

## 2024-10-16 NOTE — Telephone Encounter (Signed)
 Prescription refill request for Eliquis  received. Indication: PAF Last office visit: 09/29/24  JAYSON Meager NP Scr: 1.31 on 08/25/24 Age: 78 Weight: 82.6kg   Based on above findings Eliquis  5mg  twice daily is the appropriate dose.  Refill approved.

## 2024-12-20 ENCOUNTER — Other Ambulatory Visit: Payer: Self-pay | Admitting: Cardiovascular Disease

## 2024-12-20 DIAGNOSIS — E785 Hyperlipidemia, unspecified: Secondary | ICD-10-CM

## 2024-12-22 ENCOUNTER — Other Ambulatory Visit: Payer: Self-pay | Admitting: Cardiovascular Disease

## 2024-12-26 ENCOUNTER — Encounter: Payer: Self-pay | Admitting: Surgery

## 2025-01-03 ENCOUNTER — Ambulatory Visit: Admitting: Emergency Medicine

## 2025-01-03 VITALS — Ht 71.0 in | Wt 170.0 lb

## 2025-01-03 DIAGNOSIS — Z0000 Encounter for general adult medical examination without abnormal findings: Secondary | ICD-10-CM

## 2025-01-03 NOTE — Patient Instructions (Signed)
 Mr. Andrew Benson,  Thank you for taking the time for your Medicare Wellness Visit. I appreciate your continued commitment to your health goals. Please review the care plan we discussed, and feel free to reach out if I can assist you further.  Please note that Annual Wellness Visits do not include a physical exam. Some assessments may be limited, especially if the visit was conducted virtually. If needed, we may recommend an in-person follow-up with your provider.  Ongoing Care Seeing your primary care provider every 3 to 6 months helps us  monitor your health and provide consistent, personalized care.   Referrals If a referral was made during today's visit and you haven't received any updates within two weeks, please contact the referred provider directly to check on the status.  Recommended Screenings:  You may get a Covid vaccine at your local pharmacy at your convenience. Keep up the good work!!  Health Maintenance  Topic Date Due   COVID-19 Vaccine (4 - 2025-26 season) 08/21/2024   Medicare Annual Wellness Visit  12/09/2024   DTaP/Tdap/Td vaccine (2 - Td or Tdap) 08/02/2025*   Colon Cancer Screening  07/02/2026   Pneumococcal Vaccine for age over 95  Completed   Flu Shot  Completed   Hepatitis C Screening  Completed   Zoster (Shingles) Vaccine  Completed   Meningitis B Vaccine  Aged Out  *Topic was postponed. The date shown is not the original due date.       01/03/2025    8:32 AM  Advanced Directives  Does Patient Have a Medical Advance Directive? Yes  Type of Estate Agent of [place];Living will  Does patient want to make changes to medical advance directive? No - Patient declined  Copy of Healthcare Power of Attorney in Chart? No - copy requested    Vision: Annual vision screenings are recommended for early detection of glaucoma, cataracts, and diabetic retinopathy. These exams can also reveal signs of chronic conditions such as diabetes and high blood  pressure.  Dental: Annual dental screenings help detect early signs of oral cancer, gum disease, and other conditions linked to overall health, including heart disease and diabetes.  Please see the attached documents for additional preventive care recommendations.

## 2025-01-03 NOTE — Progress Notes (Signed)
 "  Chief Complaint  Patient presents with   Medicare Wellness     Subjective:   Andrew Benson is a 79 y.o. male who presents for a Medicare Annual Wellness Visit.  Visit info / Clinical Intake: Medicare Wellness Visit Type:: Subsequent Annual Wellness Visit Persons participating in visit and providing information:: patient Medicare Wellness Visit Mode:: Telephone If telephone:: video declined Since this visit was completed virtually, some vitals may be partially provided or unavailable. Missing vitals are due to the limitations of the virtual format.: Documented vitals are patient reported If Telephone or Video please confirm:: I connected with patient using audio/video enable telemedicine. I verified patient identity with two identifiers, discussed telehealth limitations, and patient agreed to proceed. Patient Location:: home Provider Location:: clinic office Interpreter Needed?: No Pre-visit prep was completed: yes AWV questionnaire completed by patient prior to visit?: no Living arrangements:: lives with spouse/significant other Patient's Overall Health Status Rating: (!) fair Typical amount of pain: none Does pain affect daily life?: no Are you currently prescribed opioids?: no  Dietary Habits and Nutritional Risks How many meals a day?: 3 Eats fruit and vegetables daily?: yes Most meals are obtained by: having others provide food (wife prepares meals) In the last 2 weeks, have you had any of the following?: none Diabetic:: no  Functional Status Activities of Daily Living (to include ambulation/medication): Independent Ambulation: Independent with device- listed below Home Assistive Devices/Equipment: Eyeglasses Medication Administration: Independent Home Management (perform basic housework or laundry): Independent Manage your own finances?: yes Primary transportation is: driving Concerns about vision?: no *vision screening is required for WTM* Concerns about hearing?:  no  Fall Screening Falls in the past year?: 0 Number of falls in past year: 0 Was there an injury with Fall?: 0 Fall Risk Category Calculator: 0 Patient Fall Risk Level: Low Fall Risk  Fall Risk Patient at Risk for Falls Due to: No Fall Risks Fall risk Follow up: Falls evaluation completed  Home and Transportation Safety: All rugs have non-skid backing?: yes All stairs or steps have railings?: yes Grab bars in the bathtub or shower?: yes Have non-skid surface in bathtub or shower?: yes Good home lighting?: yes Regular seat belt use?: yes Hospital stays in the last year:: no  Cognitive Assessment Difficulty concentrating, remembering, or making decisions? : yes (sometimes memory issues) Will 6CIT or Mini Cog be Completed: yes What year is it?: 0 points What month is it?: 0 points Give patient an address phrase to remember (5 components): [address] [place] About what time is it?: 0 points Count backwards from 20 to 1: 0 points Say the months of the year in reverse: 0 points Repeat the address phrase from earlier: 0 points 6 CIT Score: 0 points  Advance Directives (For Healthcare) Does Patient Have a Medical Advance Directive?: Yes Does patient want to make changes to medical advance directive?: No - Patient declined Type of Advance Directive: Healthcare Power of [place]; Living will Copy of Healthcare Power of Attorney in Chart?: No - copy requested Copy of Living Will in Chart?: No - copy requested  Reviewed/Updated  Reviewed/Updated: Reviewed All (Medical, Surgical, Family, Medications, Allergies, Care Teams, Patient Goals)    Allergies (verified) Penicillins   Current Medications (verified) Outpatient Encounter Medications as of 01/03/2025  Medication Sig   acetaminophen  (TYLENOL ) 500 MG tablet Take 500-1,000 mg by mouth every 6 (six) hours as needed (pain.).   amiodarone  (PACERONE ) 200 MG tablet Take 200 mg by mouth daily as needed.  clotrimazole-betamethasone (LOTRISONE) cream Apply 1 Application topically 2 (two) times daily. (Patient taking differently: Apply 1 Application topically 2 (two) times daily. PRN)   lisinopril  (ZESTRIL ) 2.5 MG tablet Take 1 tablet (2.5 mg total) by mouth daily.   Multiple Vitamins-Minerals (ADULT ONE DAILY GUMMIES PO) Take 2 each by mouth in the morning.   rosuvastatin  (CRESTOR ) 20 MG tablet TAKE 1 TABLET BY MOUTH EVERY DAY   tadalafil  (CIALIS ) 10 MG tablet Take 1 tablet (10 mg total) by mouth daily as needed for erectile dysfunction.   tamsulosin  (FLOMAX ) 0.4 MG CAPS capsule Take 0.8 mg by mouth daily. (Patient taking differently: Take 0.8 mg by mouth daily. Taking 0.4mg  twice a day)   apixaban  (ELIQUIS ) 5 MG TABS tablet TAKE 1 TABLET BY MOUTH TWICE A DAY (Patient not taking: Reported on 01/03/2025)   tamsulosin  (FLOMAX ) 0.4 MG CAPS capsule Take 0.4 mg by mouth daily in the afternoon. (Patient not taking: Reported on 01/03/2025)   No facility-administered encounter medications on file as of 01/03/2025.    History: Past Medical History:  Diagnosis Date   Aortic atherosclerosis    Asthma    BPH (benign prostatic hyperplasia)    CKD (chronic kidney disease), stage II    Diastolic dysfunction    a. 12/2022 Echo: EF 55-60%, no rwma, GrI DD, mild LVH, nl RV fxn, Ao sclerosis. Ao root 40mm.   Dilated aortic root    a. 12/2022 Echo: Ao root 40mm.   Elevated PSA    Erectile dysfunction    a.) on PDE5i (tadalafil )   Essential hypertension    Former smoker    History of cardiovascular stress test    a. 12/2022 Ex MV: EF 34%, freq PVCs, no ST/T changes, HTN response (211/98), no isch/infarct. CT attenuation images w/o significant Ao or Cor atherosclerosis.   Hyperlipidemia    Hypothyroidism    Left rotator cuff tear    Musculoskeletal chest pain    NSVT (nonsustained ventricular tachycardia) (HCC)    On apixaban  therapy    PAF (paroxysmal atrial fibrillation) (HCC)    a.) CHA2DS2VASc = 4 (age  x2, HTN, vascular disease) as of 08/18/2024; b.) rate/rhythm maintained on oral metoprolol  tartrate + amiodarone  as needed; chronically anticoagulated with apixaban    Palpitations    a. 11/2022 Zio: Predominantly sinus rhythm at 65 (40-255).  4 runs of NSVT, longest 8 beats (162), fastest 255 (6 beats). 8 SVT runs, longests 15 beats (127), fastest 143 5 beats). Rare PACs/PVCs.   Polyp of colon, unspecified part of colon, unspecified type    PSVT (paroxysmal supraventricular tachycardia)    Status post bilateral cataract extraction    Thrombocytopenia    TMJ locking    Past Surgical History:  Procedure Laterality Date   BICEPT TENODESIS Left 08/22/2024   Procedure: TENODESIS, BICEPS;  Surgeon: Edie Norleen PARAS, MD;  Location: [facility] ORS;  Service: Orthopedics;  Laterality: Left;   CATARACT EXTRACTION Bilateral    COLONOSCOPY WITH PROPOFOL  N/A 12/29/2017   Procedure: COLONOSCOPY WITH PROPOFOL ;  Surgeon: Viktoria Lamar DASEN, MD;  Location: [facility] ENDOSCOPY;  Service: Endoscopy;  Laterality: N/A;   COLONOSCOPY WITH PROPOFOL  N/A 07/02/2021   Procedure: COLONOSCOPY WITH PROPOFOL ;  Surgeon: Toledo, Ladell POUR, MD;  Location: [facility] ENDOSCOPY;  Service: Gastroenterology;  Laterality: N/A;   INGUINAL HERNIA REPAIR Right 2007   INGUINAL HERNIA REPAIR Left 2008   done x 2   KNEE SURGERY Left    SHOULDER OPEN ROTATOR CUFF REPAIR Left 08/22/2024   Procedure: REPAIR, ROTATOR  CUFF, OPEN;  Surgeon: Edie Norleen PARAS, MD;  Location: [facility] ORS;  Service: Orthopedics;  Laterality: Left;   SUBACROMIAL DECOMPRESSION Left 08/22/2024   Procedure: DECOMPRESSION, SUBACROMIAL SPACE;  Surgeon: Edie Norleen PARAS, MD;  Location: [facility] ORS;  Service: Orthopedics;  Laterality: Left;   TRIGGER FINGER RELEASE Left    Family History  Problem Relation Age of Onset   Hyperlipidemia Mother    Hypertension Mother    Heart disease Mother    Heart disease Sister        valve repair    Diabetes Brother    Hypertension Brother    Social History    Occupational History   Occupation: retired  Tobacco Use   Smoking status: Former    Current packs/day: 0.00    Average packs/day: 1 pack/day for 20.0 years (20.0 ttl pk-yrs)    Types: Cigarettes    Start date: 02/01/1973    Quit date: 02/01/1993    Years since quitting: 31.9    Passive exposure: Past   Smokeless tobacco: Never  Vaping Use   Vaping status: Never Used  Substance and Sexual Activity   Alcohol use: Not Currently    Comment: social drinker.   Drug use: No   Sexual activity: Not on file   Tobacco Counseling Counseling given: Not Answered  SDOH Screenings   Food Insecurity: No Food Insecurity (01/03/2025)  Housing: Low Risk (01/03/2025)  Transportation Needs: No Transportation Needs (01/03/2025)  Utilities: Not At Risk (01/03/2025)  Alcohol Screen: Low Risk (12/10/2023)  Depression (PHQ2-9): Low Risk (01/03/2025)  Financial Resource Strain: Low Risk  (10/02/2024)   Received from [facility]  Physical Activity: Inactive (01/03/2025)  Social Connections: Socially Integrated (01/03/2025)  Stress: No Stress Concern Present (01/03/2025)  Tobacco Use: Medium Risk (01/03/2025)  Health Literacy: Adequate Health Literacy (01/03/2025)   See flowsheets for full screening details  Depression Screen PHQ 2 & 9 Depression Scale- Over the past 2 weeks, how often have you been bothered by any of the following problems? Little interest or pleasure in doing things: 0 Feeling down, depressed, or hopeless (PHQ Adolescent also includes...irritable): 0 PHQ-2 Total Score: 0 Trouble falling or staying asleep, or sleeping too much: 0 Feeling tired or having little energy: 1 Poor appetite or overeating (PHQ Adolescent also includes...weight loss): 0 Feeling bad about yourself - or that you are a failure or have let yourself or your family down: 0 Trouble concentrating on things, such as reading the newspaper or watching television (PHQ Adolescent also includes...like school  work): 0 Moving or speaking so slowly that other people could have noticed. Or the opposite - being so fidgety or restless that you have been moving around a lot more than usual: 0 Thoughts that you would be better off dead, or of hurting yourself in some way: 0 PHQ-9 Total Score: 1 If you checked off any problems, how difficult have these problems made it for you to do your work, take care of things at home, or get along with other people?: Not difficult at all  Depression Treatment Depression Interventions/Treatment : [id number] Score <4 Follow-up Not Indicated     Goals Addressed               This Visit's Progress     Increase physical activity (pt-stated)               Objective:    Today's Vitals   01/03/25 0825  Weight: 170 lb (77.1 kg)  Height: 5' 11 (  1.803 m)   Body mass index is 23.71 kg/m.  Hearing/Vision screen Hearing Screening - Comments:: Denies hearing loss  Vision Screening - Comments:: UTD w/ Dr. Aureliano Portia Chuck Lena Immunizations and Health Maintenance Health Maintenance  Topic Date Due   COVID-19 Vaccine (4 - 2025-26 season) 08/21/2024   DTaP/Tdap/Td (2 - Td or Tdap) 08/02/2025 (Originally 12/21/2017)   Medicare Annual Wellness (AWV)  01/03/2026   Colonoscopy  07/02/2026   Pneumococcal Vaccine: 50+ Years  Completed   Influenza Vaccine  Completed   Hepatitis C Screening  Completed   Zoster Vaccines- Shingrix  Completed   Meningococcal B Vaccine  Aged Out        Assessment/Plan:  This is a routine wellness examination for [place].  Patient Care Team: Edman Marsa PARAS, DO as PCP - General (Family Medicine) Perla Evalene PARAS, MD as Consulting Physician (Cardiology) Portia Fireman, OD (Optometry) [facility] (Urology) Poggi, Norleen PARAS, MD as Consulting Physician (Orthopedic Surgery)  I have personally reviewed and noted the following in the patients chart:   Medical and social history Use of alcohol, tobacco or illicit drugs   Current medications and supplements including opioid prescriptions. Functional ability and status Nutritional status Physical activity Advanced directives List of other physicians Hospitalizations, surgeries, and ER visits in previous 12 months Vitals Screenings to include cognitive, depression, and falls Referrals and appointments  No orders of the defined types were placed in this encounter.  In addition, I have reviewed and discussed with patient certain preventive protocols, quality metrics, and best practice recommendations. A written personalized care plan for preventive services as well as general preventive health recommendations were provided to patient.   Vina Ned, CMA   01/03/2025   Return in 53 weeks (on 01/09/2026) for Medicare Annual Wellness Visit.  After Visit Summary: (MyChart) Due to this being a telephonic visit, the after visit summary with patients personalized plan was offered to patient via MyChart   Nurse Notes:  Declined Tdap vaccine Declined Covid vaccine at this time "

## 2025-01-26 ENCOUNTER — Ambulatory Visit: Admitting: Family Medicine

## 2025-01-26 ENCOUNTER — Encounter: Payer: Self-pay | Admitting: Family Medicine

## 2025-01-26 VITALS — BP 130/78 | HR 96 | Ht 71.0 in | Wt 164.2 lb

## 2025-01-26 DIAGNOSIS — R197 Diarrhea, unspecified: Secondary | ICD-10-CM

## 2025-01-26 DIAGNOSIS — R634 Abnormal weight loss: Secondary | ICD-10-CM

## 2025-01-26 DIAGNOSIS — R972 Elevated prostate specific antigen [PSA]: Secondary | ICD-10-CM

## 2025-01-26 DIAGNOSIS — R195 Other fecal abnormalities: Secondary | ICD-10-CM

## 2025-01-26 DIAGNOSIS — R310 Gross hematuria: Secondary | ICD-10-CM

## 2025-01-26 DIAGNOSIS — R17 Unspecified jaundice: Secondary | ICD-10-CM

## 2025-01-26 DIAGNOSIS — K838 Other specified diseases of biliary tract: Secondary | ICD-10-CM

## 2025-01-26 DIAGNOSIS — N401 Enlarged prostate with lower urinary tract symptoms: Secondary | ICD-10-CM

## 2025-01-26 DIAGNOSIS — N138 Other obstructive and reflux uropathy: Secondary | ICD-10-CM

## 2025-01-26 NOTE — Progress Notes (Signed)
 "  Subjective:    Patient ID: Andrew Benson, male    DOB: 1946-08-03, 79 y.o.   MRN: [id number]  Andrew Benson is a 79 y.o. male presenting on 01/26/2025 for Weight Loss (Concerns over unexplained weight loss, eye look funny, taste is different, frequent diarrhea about 1 month )  Patient presents for a same day appointment.  HPI  Discussed the use of AI scribe software for clinical note transcription with the patient, who gave verbal consent to proceed.  History of Present Illness   Andrew Benson is a 79 year old male who presents with unexplained weight loss and jaundice. He is accompanied by his wife.  Unintentional weight loss - Unexplained loss of approximately 20 pounds since October. 182 lbs > 164 lbs  - Maintains usual appetite - Change in taste, particularly with coffee - Concerned about possible medication-related causes  Scleral Icterus / Jaundice - Yellowish discoloration of sclera for approximately 1.5 weeks - Initially worse, now stable - No pain, itching, irritation, or watering of the eyes, except for occasional watering in one eye - Has Eye Dr has not been there yet.  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 Prior extensive work up cystoscopy CT urinalysis - Dark, orangey urine for about one month - Symptoms persist despite completing medication for enlarged prostate  Gastrointestinal changes Diarrhea / Pale Stools - Diarrhea with pale stools occurring with every bowel movement - No abdominal pain or discomfort - Symptoms not related to food intake  Medication history - Completed course of doxycycline today - No longer taking amiodarone  or a blood thinner           01/03/2025    8:40 AM 07/25/2024   11:05 AM 07/25/2024   11:04 AM  Depression screen PHQ 2/9  Decreased Interest 0 0 0  Down, Depressed, Hopeless 0 0 0  PHQ - 2 Score 0 0 0  Altered sleeping 0 0   Tired, decreased  energy 1 0   Change in appetite 0 0   Feeling bad or failure about yourself  0 0   Trouble concentrating 0 0   Moving slowly or fidgety/restless 0 1   Suicidal thoughts 0 0   PHQ-9 Score 1 1    Difficult doing work/chores Not difficult at all Not difficult at all      Data saved with a previous flowsheet row definition       07/25/2024   11:05 AM 03/20/2024    1:39 PM 07/01/2023    9:18 AM 02/03/2023    2:45 PM  GAD 7 : Generalized Anxiety Score  Nervous, Anxious, on Edge 0  0  0  0   Control/stop worrying 0  0  0  0   Worry too much - different things 0  0  0  0   Trouble relaxing 0  0  0  0   Restless 0  0  0  0   Easily annoyed or irritable 0  0  0  0   Afraid - awful might happen 0  0  0  0   Total GAD 7 Score 0 0 0 0  Anxiety Difficulty  Not difficult at all Not difficult at all Not difficult at all     Data saved with a previous flowsheet row definition    Social History[1]  Review of Systems Per HPI unless specifically indicated above     Objective:  BP 130/78 (BP Location: Right Arm, Patient Position: Sitting, Cuff Size: Normal)   Pulse 96   Ht 5' 11 (1.803 m)   Wt 164 lb 4 oz (74.5 kg)   SpO2 97%   BMI 22.91 kg/m   Wt Readings from Last 3 Encounters:  01/26/25 164 lb 4 oz (74.5 kg)  01/03/25 170 lb (77.1 kg)  09/29/24 182 lb 3.2 oz (82.6 kg)    Physical Exam Vitals and nursing note reviewed.  Constitutional:      General: He is not in acute distress.    Appearance: He is well-developed. He is not diaphoretic.     Comments: Well-appearing, comfortable, cooperative  HENT:     Head: Normocephalic and atraumatic.  Eyes:     General: Scleral icterus (bilateral) present.        Right eye: No discharge.        Left eye: No discharge.     Conjunctiva/sclera: Conjunctivae normal.  Neck:     Thyroid : No thyromegaly.  Cardiovascular:     Rate and Rhythm: Normal rate and regular rhythm.     Pulses: Normal pulses.     Heart sounds: Normal heart sounds.  No murmur heard. Pulmonary:     Effort: Pulmonary effort is normal. No respiratory distress.     Breath sounds: Normal breath sounds. No wheezing or rales.  Abdominal:     General: Bowel sounds are normal. There is no distension.     Palpations: Abdomen is soft. There is no mass.     Tenderness: There is no abdominal tenderness. There is no guarding or rebound.     Hernia: No hernia is present.     Comments: Negative Murphy's on exam  Musculoskeletal:        General: Normal range of motion.     Cervical back: Normal range of motion and neck supple.  Lymphadenopathy:     Cervical: No cervical adenopathy.  Skin:    General: Skin is warm and dry.     Findings: No erythema or rash.  Neurological:     Mental Status: He is alert and oriented to person, place, and time. Mental status is at baseline.  Psychiatric:        Behavior: Behavior normal.     Comments: Well groomed, good eye contact, normal speech and thoughts      I have personally reviewed the radiology report from 10/27/24 on CT.  CT Abdomen Pelvis Wo Contrast (Stone Protocol)  Anatomical Region Laterality Modality  Abdomen -- Computed Tomography  Pelvis -- --   Impression  1.  Prior right ureterovesical junction stone is no longer visualized. There is a 3 mm stone in the urinary bladder. No other renal calculi. No hydronephrosis on either side. 2.  Enlarged prostate. 3.  Hydropic gallbladder. The common bile duct is borderline dilated. No pericholecystic inflammatory stranding. Consider correlation with liver enzymes for any biochemical evidence of biliary obstruction though findings are overall similar compared to prior. Narrative  EXAM: CT ABDOMEN PELVIS WO CONTRAST ACCESSION: [id number] UN REPORT DATE: 10/27/2024 3:50 PM CLINICAL INDICATION: 79 years old with r/o persistent right UVJ stone  - N20.1 - Ureteral stone    COMPARISON: 08/25/2024  TECHNIQUE: A spiral CT scan was obtained without IV contrast from the  lung bases to the pubic symphysis.  Images were reconstructed in the axial plane. Coronal and sagittal reformatted images were also provided for further evaluation.  Evaluation of the solid organs and vasculature is limited in the absence  of intravenous contrast.   FINDINGS:  LOWER CHEST: Unremarkable.  LIVER: Normal liver contour. No focal liver lesion on non-contrast examination.  BILIARY: Hydropic gallbladder otherwise unremarkable, no wall thickening or pericholecystic inflammatory stranding. No intrahepatic biliary ductal dilatation. The common bile duct is borderline dilated measuring about 9 mm, similar prior.  SPLEEN: Normal in size and contour.  PANCREAS: Normal pancreatic contour without signs of inflammation or gross ductal dilatation.  ADRENAL GLANDS: Normal appearance of the adrenal glands.  KIDNEYS/URETERS: Smooth renal contours.  No nephrolithiasis.  No ureteral dilatation or collecting system distention.  BLADDER: 3 mm stone in the urinary bladder similar the prior exam. Prior right-sided ureterovesical junction stone no longer visualized.  REPRODUCTIVE ORGANS: Enlarged prostate.  GI TRACT: No findings of bowel obstruction or acute inflammation. Normal appendix.  PERITONEUM/RETROPERITONEUM AND MESENTERY: No free air. No ascites. No fluid collection. Small fat-containing left inguinal hernia.  VASCULATURE: Normal caliber aorta. Mild atherosclerosis.  LYMPH NODES: No adenopathy.  BONES and SOFT TISSUES: No aggressive osseous lesions. No focal soft tissue lesions. Bilateral hip osteoarthrosis. Procedure Note  Prentiss Fonda LABOR, MD - 10/27/2024 Formatting of this note might be different from the original. EXAM: CT ABDOMEN PELVIS WO CONTRAST ACCESSION: [id number] UN REPORT DATE: 10/27/2024 3:50 PM CLINICAL INDICATION: 79 years old with r/o persistent right UVJ stone  - N20.1 - Ureteral stone    COMPARISON: 08/25/2024  TECHNIQUE: A spiral CT scan was obtained  without IV contrast from the lung bases to the pubic symphysis.  Images were reconstructed in the axial plane. Coronal and sagittal reformatted images were also provided for further evaluation.  Evaluation of the solid organs and vasculature is limited in the absence of intravenous contrast.   FINDINGS:  LOWER CHEST: Unremarkable.  LIVER: Normal liver contour. No focal liver lesion on non-contrast examination.  BILIARY: Hydropic gallbladder otherwise unremarkable, no wall thickening or pericholecystic inflammatory stranding. No intrahepatic biliary ductal dilatation. The common bile duct is borderline dilated measuring about 9 mm, similar prior.  SPLEEN: Normal in size and contour.  PANCREAS: Normal pancreatic contour without signs of inflammation or gross ductal dilatation.  ADRENAL GLANDS: Normal appearance of the adrenal glands.  KIDNEYS/URETERS: Smooth renal contours.  No nephrolithiasis.  No ureteral dilatation or collecting system distention.  BLADDER: 3 mm stone in the urinary bladder similar the prior exam. Prior right-sided ureterovesical junction stone no longer visualized.  REPRODUCTIVE ORGANS: Enlarged prostate.  GI TRACT: No findings of bowel obstruction or acute inflammation. Normal appendix.  PERITONEUM/RETROPERITONEUM AND MESENTERY: No free air. No ascites. No fluid collection. Small fat-containing left inguinal hernia.  VASCULATURE: Normal caliber aorta. Mild atherosclerosis.  LYMPH NODES: No adenopathy.  BONES and SOFT TISSUES: No aggressive osseous lesions. No focal soft tissue lesions. Bilateral hip osteoarthrosis.  IMPRESSION: 1.  Prior right ureterovesical junction stone is no longer visualized. There is a 3 mm stone in the urinary bladder. No other renal calculi. No hydronephrosis on either side. 2.  Enlarged prostate. 3.  Hydropic gallbladder. The common bile duct is borderline dilated. No pericholecystic inflammatory stranding. Consider correlation with  liver enzymes for any biochemical evidence of biliary obstruction though findings are overall similar compared to prior. Exam End: 10/27/24 15:48   Specimen Collected: 10/27/24 15:50 Last Resulted: 10/27/24 15:59  Received From: [facility]  Result Received: 12/07/24 12:47    Results for orders placed or performed during the hospital encounter of 08/25/24  Urinalysis, w/ Reflex to Culture (Infection Suspected) -Urine, Clean Catch   Collection Time: 08/25/24  1:06 PM  Result Value Ref Range   Specimen Source URINE, CLEAN CATCH    Color, Urine RED (A) YELLOW   APPearance CLOUDY (A) CLEAR   Specific Gravity, Urine  1.005 - 1.030    TEST NOT REPORTED DUE TO COLOR INTERFERENCE OF URINE PIGMENT   pH  5.0 - 8.0    TEST NOT REPORTED DUE TO COLOR INTERFERENCE OF URINE PIGMENT   Glucose, UA (A) NEGATIVE mg/dL    TEST NOT REPORTED DUE TO COLOR INTERFERENCE OF URINE PIGMENT   Hgb urine dipstick (A) NEGATIVE    TEST NOT REPORTED DUE TO COLOR INTERFERENCE OF URINE PIGMENT   Bilirubin Urine (A) NEGATIVE    TEST NOT REPORTED DUE TO COLOR INTERFERENCE OF URINE PIGMENT   Ketones, ur (A) NEGATIVE mg/dL    TEST NOT REPORTED DUE TO COLOR INTERFERENCE OF URINE PIGMENT   Protein, ur (A) NEGATIVE mg/dL    TEST NOT REPORTED DUE TO COLOR INTERFERENCE OF URINE PIGMENT   Nitrite (A) NEGATIVE    TEST NOT REPORTED DUE TO COLOR INTERFERENCE OF URINE PIGMENT   Leukocytes,Ua (A) NEGATIVE    TEST NOT REPORTED DUE TO COLOR INTERFERENCE OF URINE PIGMENT   Squamous Epithelial / HPF NONE SEEN 0 - 5 /HPF   WBC, UA NONE SEEN 0 - 5 WBC/hpf   RBC / HPF >50 0 - 5 RBC/hpf   Bacteria, UA RARE (A) NONE SEEN      Assessment & Plan:   Problem List Items Addressed This Visit     Benign localized hyperplasia of prostate with urinary obstruction   Relevant Orders   PSA   Elevated PSA   Relevant Orders   PSA   Other Visit Diagnoses       Scleral icterus    -  Primary   Relevant Orders   US  ABDOMEN LIMITED RUQ  (LIVER/GB)   Basic metabolic panel with GFR   CBC with Differential/Platelet   Hepatic function panel     Pale stool         Unintentional weight loss       Relevant Orders   Basic metabolic panel with GFR   CBC with Differential/Platelet   Hepatic function panel     Diarrhea, unspecified type         Gross hematuria       Relevant Orders   Urinalysis, Routine w reflex microscopic     Common bile duct dilatation       Relevant Orders   US  ABDOMEN LIMITED RUQ (LIVER/GB)   CBC with Differential/Platelet   Hepatic function panel       Unexplained weight loss with gastrointestinal and ocular symptoms Unexplained weight loss with taste changes, diarrhea, and jaundice and pale stools Differential includes gastrointestinal issues, medication side effects, or systemic conditions. Recent CT for prostate/urology showed incidental finding Borderline common bile duct dilation noted. Normal bilirubin levels previously, further evaluation needed. - Ordered comprehensive metabolic panel including bilirubin and liver function tests. - Scheduled abdominal RUQ ultrasound to evaluate common bile duct dilation. - Encouraged nutritional intake with high-calorie supplements like Ensure or Boost. - Consider referral to oncology if no definitive diagnosis is found. - for Diagnostic Evaluation to rule out malignancy  Weight loss may be nutritional ultimately if negative work up. Reduced taste or loss of taste can be from previous medications, however no longer on active medication that would likely cause this. But sounds like intake is low likely as explanation  Lack of many  other acute symptoms or GI red flags. No nausea vomiting abdominal pain  Gross hematuria with benign prostatic hyperplasia Followed by [facility] Persistent gross hematuria likely related to benign prostatic hyperplasia. Previous workup negative for cancer including CT, Cystoscopy, Labs / Urinalysis.  Urology monitoring  condition. - Continue monitoring with urology. - On medication management  Will add PSA + Urinalysis  Borderline common bile duct dilation Borderline common bile duct dilation noted on imaging. Potential correlation with gastrointestinal symptoms. Further evaluation needed to rule out obstruction or other pathology. - Ordered abdominal ultrasound to reassess common bile duct dilation. - Evaluate results and consider further testing if indicated.      Recommended Optometry eval for scleral icterus as well. It may be within range of normal and not directly related.   RUQ Abdominal US  - [facility]  Orders Placed This Encounter  Procedures   US  ABDOMEN LIMITED RUQ (LIVER/GB)    Standing Status:   Future    Expiration Date:   03/28/2026    Reason for exam::   abnormal CT with borderline common bile duct dilatation, scleral icterus    Preferred imaging location?:   [facility] [place]   Basic metabolic panel with GFR   CBC with Differential/Platelet   Hepatic function panel   Urinalysis, Routine w reflex microscopic   PSA    No orders of the defined types were placed in this encounter.   Follow up plan: Return if symptoms worsen or fail to improve.  Labs on Monday 01/29/25 830am  Marsa Officer, DO [facility] 01/26/2025, 4:16 PM     [1]  Social History Tobacco Use   Smoking status: Former    Current packs/day: 0.00    Average packs/day: 1 pack/day for 20.0 years (20.0 ttl pk-yrs)    Types: Cigarettes    Start date: 02/01/1973    Quit date: 02/01/1993    Years since quitting: 32.0    Passive exposure: Past   Smokeless tobacco: Never  Vaping Use   Vaping status: Never Used  Substance Use Topics   Alcohol use: Not Currently    Comment: social drinker.   Drug use: No   "

## 2025-01-26 NOTE — Patient Instructions (Addendum)
 Thank you for coming to the office today.  Labs next week on Monday 2/9 at 830am for lab panel + Urinalysis  Focus on high protein and nutrient dense foods and high calorie.  Ordered Abdominal Ultrasound imaging, they call to schedule. Likely [facility]  Call you with results and discussion and consider 2nd opinion if needed.  We may refer to the Oncology team over at [facility] - for a consultation  Consider the Eye Doctor as well for evaluation of the yellowing or jaundice.  Please schedule a Follow-up Appointment to: Return if symptoms worsen or fail to improve.  If you have any other questions or concerns, please feel free to call the office or send a message through MyChart. You may also schedule an earlier appointment if necessary.  Additionally, you may be receiving a survey about your experience at our office within a few days to 1 week by e-mail or mail. We value your feedback.  Marsa Officer, DO [facility], [place]

## 2025-01-29 ENCOUNTER — Other Ambulatory Visit

## 2025-02-06 ENCOUNTER — Ambulatory Visit: Admitting: Urology

## 2025-02-07 ENCOUNTER — Ambulatory Visit: Payer: Medicare Other | Admitting: Urology

## 2025-08-02 ENCOUNTER — Other Ambulatory Visit

## 2025-08-08 ENCOUNTER — Encounter: Admitting: Family Medicine

## 2026-01-09 ENCOUNTER — Ambulatory Visit
